# Patient Record
Sex: Female | Born: 1935
Health system: Southern US, Community
[De-identification: ages and names within clinical notes are randomized; demographics above are authoritative.]

## PROBLEM LIST (undated history)

## (undated) DIAGNOSIS — Z8719 Personal history of other diseases of the digestive system: Secondary | ICD-10-CM

## (undated) DIAGNOSIS — K219 Gastro-esophageal reflux disease without esophagitis: Secondary | ICD-10-CM

## (undated) DIAGNOSIS — H269 Unspecified cataract: Secondary | ICD-10-CM

## (undated) DIAGNOSIS — N189 Chronic kidney disease, unspecified: Secondary | ICD-10-CM

## (undated) DIAGNOSIS — M1990 Unspecified osteoarthritis, unspecified site: Secondary | ICD-10-CM

## (undated) DIAGNOSIS — M4800 Spinal stenosis, site unspecified: Secondary | ICD-10-CM

## (undated) HISTORY — DX: Unspecified osteoarthritis, unspecified site: M19.90

## (undated) HISTORY — PX: TONSILLECTOMY: SUR1361

## (undated) HISTORY — PX: OTHER SURGICAL HISTORY: SHX169

## (undated) HISTORY — PX: UPPER GI ENDOSCOPY: SHX6162

## (undated) HISTORY — PX: MULTIPLE TOOTH EXTRACTIONS: SHX2053

## (undated) HISTORY — PX: COLONOSCOPY: SHX174

---

## 1999-05-10 ENCOUNTER — Other Ambulatory Visit: Admission: RE | Admit: 1999-05-10 | Discharge: 1999-05-10 | Payer: Self-pay | Admitting: Obstetrics and Gynecology

## 2000-04-22 ENCOUNTER — Other Ambulatory Visit: Admission: RE | Admit: 2000-04-22 | Discharge: 2000-04-22 | Payer: Self-pay | Admitting: Obstetrics and Gynecology

## 2000-08-02 ENCOUNTER — Encounter: Payer: Self-pay | Admitting: Internal Medicine

## 2000-08-02 ENCOUNTER — Encounter: Admission: RE | Admit: 2000-08-02 | Discharge: 2000-08-02 | Payer: Self-pay | Admitting: Internal Medicine

## 2000-10-10 ENCOUNTER — Encounter: Admission: RE | Admit: 2000-10-10 | Discharge: 2000-10-10 | Payer: Self-pay | Admitting: Obstetrics and Gynecology

## 2000-10-10 ENCOUNTER — Encounter: Payer: Self-pay | Admitting: Obstetrics and Gynecology

## 2001-01-14 ENCOUNTER — Encounter: Payer: Self-pay | Admitting: Internal Medicine

## 2001-01-14 ENCOUNTER — Encounter: Admission: RE | Admit: 2001-01-14 | Discharge: 2001-01-14 | Payer: Self-pay | Admitting: Internal Medicine

## 2001-03-06 ENCOUNTER — Encounter: Payer: Self-pay | Admitting: Orthopedic Surgery

## 2001-03-06 ENCOUNTER — Ambulatory Visit (HOSPITAL_COMMUNITY): Admission: RE | Admit: 2001-03-06 | Discharge: 2001-03-06 | Payer: Self-pay | Admitting: Orthopedic Surgery

## 2001-03-21 ENCOUNTER — Encounter: Payer: Self-pay | Admitting: Orthopedic Surgery

## 2001-03-21 ENCOUNTER — Encounter: Admission: RE | Admit: 2001-03-21 | Discharge: 2001-03-21 | Payer: Self-pay | Admitting: Orthopedic Surgery

## 2001-04-02 ENCOUNTER — Encounter: Admission: RE | Admit: 2001-04-02 | Discharge: 2001-04-02 | Payer: Self-pay | Admitting: Orthopedic Surgery

## 2001-04-02 ENCOUNTER — Encounter: Payer: Self-pay | Admitting: Orthopedic Surgery

## 2001-04-28 ENCOUNTER — Other Ambulatory Visit: Admission: RE | Admit: 2001-04-28 | Discharge: 2001-04-28 | Payer: Self-pay | Admitting: Obstetrics and Gynecology

## 2001-10-14 ENCOUNTER — Encounter: Admission: RE | Admit: 2001-10-14 | Discharge: 2001-10-14 | Payer: Self-pay | Admitting: Obstetrics and Gynecology

## 2001-10-14 ENCOUNTER — Encounter: Payer: Self-pay | Admitting: Obstetrics and Gynecology

## 2001-10-16 ENCOUNTER — Encounter: Admission: RE | Admit: 2001-10-16 | Discharge: 2001-10-16 | Payer: Self-pay | Admitting: Obstetrics and Gynecology

## 2001-10-16 ENCOUNTER — Encounter: Payer: Self-pay | Admitting: Obstetrics and Gynecology

## 2002-10-20 ENCOUNTER — Encounter: Admission: RE | Admit: 2002-10-20 | Discharge: 2002-10-20 | Payer: Self-pay | Admitting: Internal Medicine

## 2002-10-20 ENCOUNTER — Encounter: Payer: Self-pay | Admitting: Internal Medicine

## 2003-01-14 ENCOUNTER — Other Ambulatory Visit: Admission: RE | Admit: 2003-01-14 | Discharge: 2003-01-14 | Payer: Self-pay | Admitting: Obstetrics and Gynecology

## 2003-11-22 ENCOUNTER — Encounter: Admission: RE | Admit: 2003-11-22 | Discharge: 2003-11-22 | Payer: Self-pay | Admitting: Obstetrics and Gynecology

## 2004-01-18 ENCOUNTER — Other Ambulatory Visit: Admission: RE | Admit: 2004-01-18 | Discharge: 2004-01-18 | Payer: Self-pay | Admitting: Obstetrics and Gynecology

## 2004-11-01 ENCOUNTER — Encounter: Admission: RE | Admit: 2004-11-01 | Discharge: 2004-11-01 | Payer: Self-pay | Admitting: Internal Medicine

## 2004-12-14 ENCOUNTER — Encounter: Admission: RE | Admit: 2004-12-14 | Discharge: 2004-12-14 | Payer: Self-pay | Admitting: Obstetrics and Gynecology

## 2006-01-24 ENCOUNTER — Other Ambulatory Visit: Admission: RE | Admit: 2006-01-24 | Discharge: 2006-01-24 | Payer: Self-pay | Admitting: Obstetrics and Gynecology

## 2006-03-07 ENCOUNTER — Encounter: Admission: RE | Admit: 2006-03-07 | Discharge: 2006-03-07 | Payer: Self-pay | Admitting: Obstetrics and Gynecology

## 2006-06-07 ENCOUNTER — Encounter: Admission: RE | Admit: 2006-06-07 | Discharge: 2006-06-07 | Payer: Self-pay | Admitting: Internal Medicine

## 2007-07-01 ENCOUNTER — Encounter: Admission: RE | Admit: 2007-07-01 | Discharge: 2007-07-01 | Payer: Self-pay | Admitting: Internal Medicine

## 2007-09-29 ENCOUNTER — Encounter: Admission: RE | Admit: 2007-09-29 | Discharge: 2007-09-29 | Payer: Self-pay | Admitting: Obstetrics and Gynecology

## 2008-07-29 ENCOUNTER — Other Ambulatory Visit: Admission: RE | Admit: 2008-07-29 | Discharge: 2008-07-29 | Payer: Self-pay | Admitting: Obstetrics and Gynecology

## 2008-10-01 ENCOUNTER — Encounter: Admission: RE | Admit: 2008-10-01 | Discharge: 2008-10-01 | Payer: Self-pay | Admitting: Obstetrics and Gynecology

## 2009-10-04 ENCOUNTER — Encounter: Admission: RE | Admit: 2009-10-04 | Discharge: 2009-10-04 | Payer: Self-pay | Admitting: Obstetrics and Gynecology

## 2009-10-17 ENCOUNTER — Encounter: Admission: RE | Admit: 2009-10-17 | Discharge: 2009-10-17 | Payer: Self-pay | Admitting: Internal Medicine

## 2010-12-31 ENCOUNTER — Encounter: Payer: Self-pay | Admitting: Internal Medicine

## 2012-03-26 DIAGNOSIS — M899 Disorder of bone, unspecified: Secondary | ICD-10-CM | POA: Diagnosis not present

## 2012-05-14 DIAGNOSIS — M899 Disorder of bone, unspecified: Secondary | ICD-10-CM | POA: Diagnosis not present

## 2012-05-14 DIAGNOSIS — R030 Elevated blood-pressure reading, without diagnosis of hypertension: Secondary | ICD-10-CM | POA: Diagnosis not present

## 2012-05-14 DIAGNOSIS — E782 Mixed hyperlipidemia: Secondary | ICD-10-CM | POA: Diagnosis not present

## 2012-05-14 DIAGNOSIS — M949 Disorder of cartilage, unspecified: Secondary | ICD-10-CM | POA: Diagnosis not present

## 2012-09-08 DIAGNOSIS — Z23 Encounter for immunization: Secondary | ICD-10-CM | POA: Diagnosis not present

## 2012-11-12 DIAGNOSIS — Z124 Encounter for screening for malignant neoplasm of cervix: Secondary | ICD-10-CM | POA: Diagnosis not present

## 2012-11-12 DIAGNOSIS — Z1231 Encounter for screening mammogram for malignant neoplasm of breast: Secondary | ICD-10-CM | POA: Diagnosis not present

## 2012-11-12 DIAGNOSIS — J329 Chronic sinusitis, unspecified: Secondary | ICD-10-CM | POA: Diagnosis not present

## 2012-11-14 DIAGNOSIS — M949 Disorder of cartilage, unspecified: Secondary | ICD-10-CM | POA: Diagnosis not present

## 2012-11-14 DIAGNOSIS — E782 Mixed hyperlipidemia: Secondary | ICD-10-CM | POA: Diagnosis not present

## 2012-11-14 DIAGNOSIS — R030 Elevated blood-pressure reading, without diagnosis of hypertension: Secondary | ICD-10-CM | POA: Diagnosis not present

## 2012-11-14 DIAGNOSIS — Z0000 Encounter for general adult medical examination without abnormal findings: Secondary | ICD-10-CM | POA: Diagnosis not present

## 2013-01-28 DIAGNOSIS — H2510 Age-related nuclear cataract, unspecified eye: Secondary | ICD-10-CM | POA: Diagnosis not present

## 2013-01-28 DIAGNOSIS — H11829 Conjunctivochalasis, unspecified eye: Secondary | ICD-10-CM | POA: Diagnosis not present

## 2013-01-31 ENCOUNTER — Ambulatory Visit (INDEPENDENT_AMBULATORY_CARE_PROVIDER_SITE_OTHER): Payer: Medicare Other | Admitting: Family Medicine

## 2013-01-31 ENCOUNTER — Ambulatory Visit: Payer: PRIVATE HEALTH INSURANCE

## 2013-01-31 VITALS — BP 181/76 | HR 72 | Temp 97.9°F | Resp 16 | Ht 61.5 in | Wt 135.0 lb

## 2013-01-31 DIAGNOSIS — M84376A Stress fracture, unspecified foot, initial encounter for fracture: Secondary | ICD-10-CM | POA: Diagnosis not present

## 2013-01-31 DIAGNOSIS — M25579 Pain in unspecified ankle and joints of unspecified foot: Secondary | ICD-10-CM

## 2013-01-31 NOTE — Progress Notes (Signed)
Subjective: Patient has been hurting over the past week in her right foot, but hurt worse last night in the night and then again today. She had a little erythema and ecchymosis develop overlying the distal third and fourth metatarsals. Knows of no injury. She is an active individual. Does go to the gym. She thinks she may have had osteopenia on her bone scan, not osteoporosis. She has seen Dr. Luiz Blare in the past as her orthopedist.  Objective: No acute distress. Mild ecchymosis over the distal third fourth metatarsals at the M TP joint area. She is quite tender over the distal third metatarsal, some over the fourth.  [facility] reading (PRIMARY) by  Dr. Alwyn Ren Possible subtle angulation of distal third metatarsal  Assessment: Probable stress fracture Foot pain  Plan: Refer her back to her orthopedist Postop shoe Continue using cane.

## 2013-01-31 NOTE — Patient Instructions (Signed)
Minimize weight-bearing Wear postop shoe Followup with Dr. Luiz Blare Ibuprofen for pain

## 2013-02-03 DIAGNOSIS — S92309A Fracture of unspecified metatarsal bone(s), unspecified foot, initial encounter for closed fracture: Secondary | ICD-10-CM | POA: Diagnosis not present

## 2013-02-19 DIAGNOSIS — S92309A Fracture of unspecified metatarsal bone(s), unspecified foot, initial encounter for closed fracture: Secondary | ICD-10-CM | POA: Diagnosis not present

## 2013-03-12 DIAGNOSIS — M25579 Pain in unspecified ankle and joints of unspecified foot: Secondary | ICD-10-CM | POA: Diagnosis not present

## 2013-03-12 DIAGNOSIS — Q762 Congenital spondylolisthesis: Secondary | ICD-10-CM | POA: Diagnosis not present

## 2013-03-21 DIAGNOSIS — M47817 Spondylosis without myelopathy or radiculopathy, lumbosacral region: Secondary | ICD-10-CM | POA: Diagnosis not present

## 2013-03-24 DIAGNOSIS — M48061 Spinal stenosis, lumbar region without neurogenic claudication: Secondary | ICD-10-CM | POA: Diagnosis not present

## 2013-03-24 DIAGNOSIS — M545 Low back pain: Secondary | ICD-10-CM | POA: Diagnosis not present

## 2013-04-01 DIAGNOSIS — M48061 Spinal stenosis, lumbar region without neurogenic claudication: Secondary | ICD-10-CM | POA: Diagnosis not present

## 2013-04-09 DIAGNOSIS — M48061 Spinal stenosis, lumbar region without neurogenic claudication: Secondary | ICD-10-CM | POA: Diagnosis not present

## 2013-05-06 DIAGNOSIS — M545 Low back pain: Secondary | ICD-10-CM | POA: Diagnosis not present

## 2013-05-21 DIAGNOSIS — M545 Low back pain: Secondary | ICD-10-CM | POA: Diagnosis not present

## 2013-05-21 DIAGNOSIS — E782 Mixed hyperlipidemia: Secondary | ICD-10-CM | POA: Diagnosis not present

## 2013-05-21 DIAGNOSIS — M949 Disorder of cartilage, unspecified: Secondary | ICD-10-CM | POA: Diagnosis not present

## 2013-05-21 DIAGNOSIS — K649 Unspecified hemorrhoids: Secondary | ICD-10-CM | POA: Diagnosis not present

## 2013-06-09 IMAGING — CR DG FOOT COMPLETE 3+V*R*
3 series · 3 of 3 positions shown · non-contrast
Comparison: None.

CLINICAL DATA: Foot pain

RIGHT FOOT COMPLETE - 3+ VIEW

[AP]
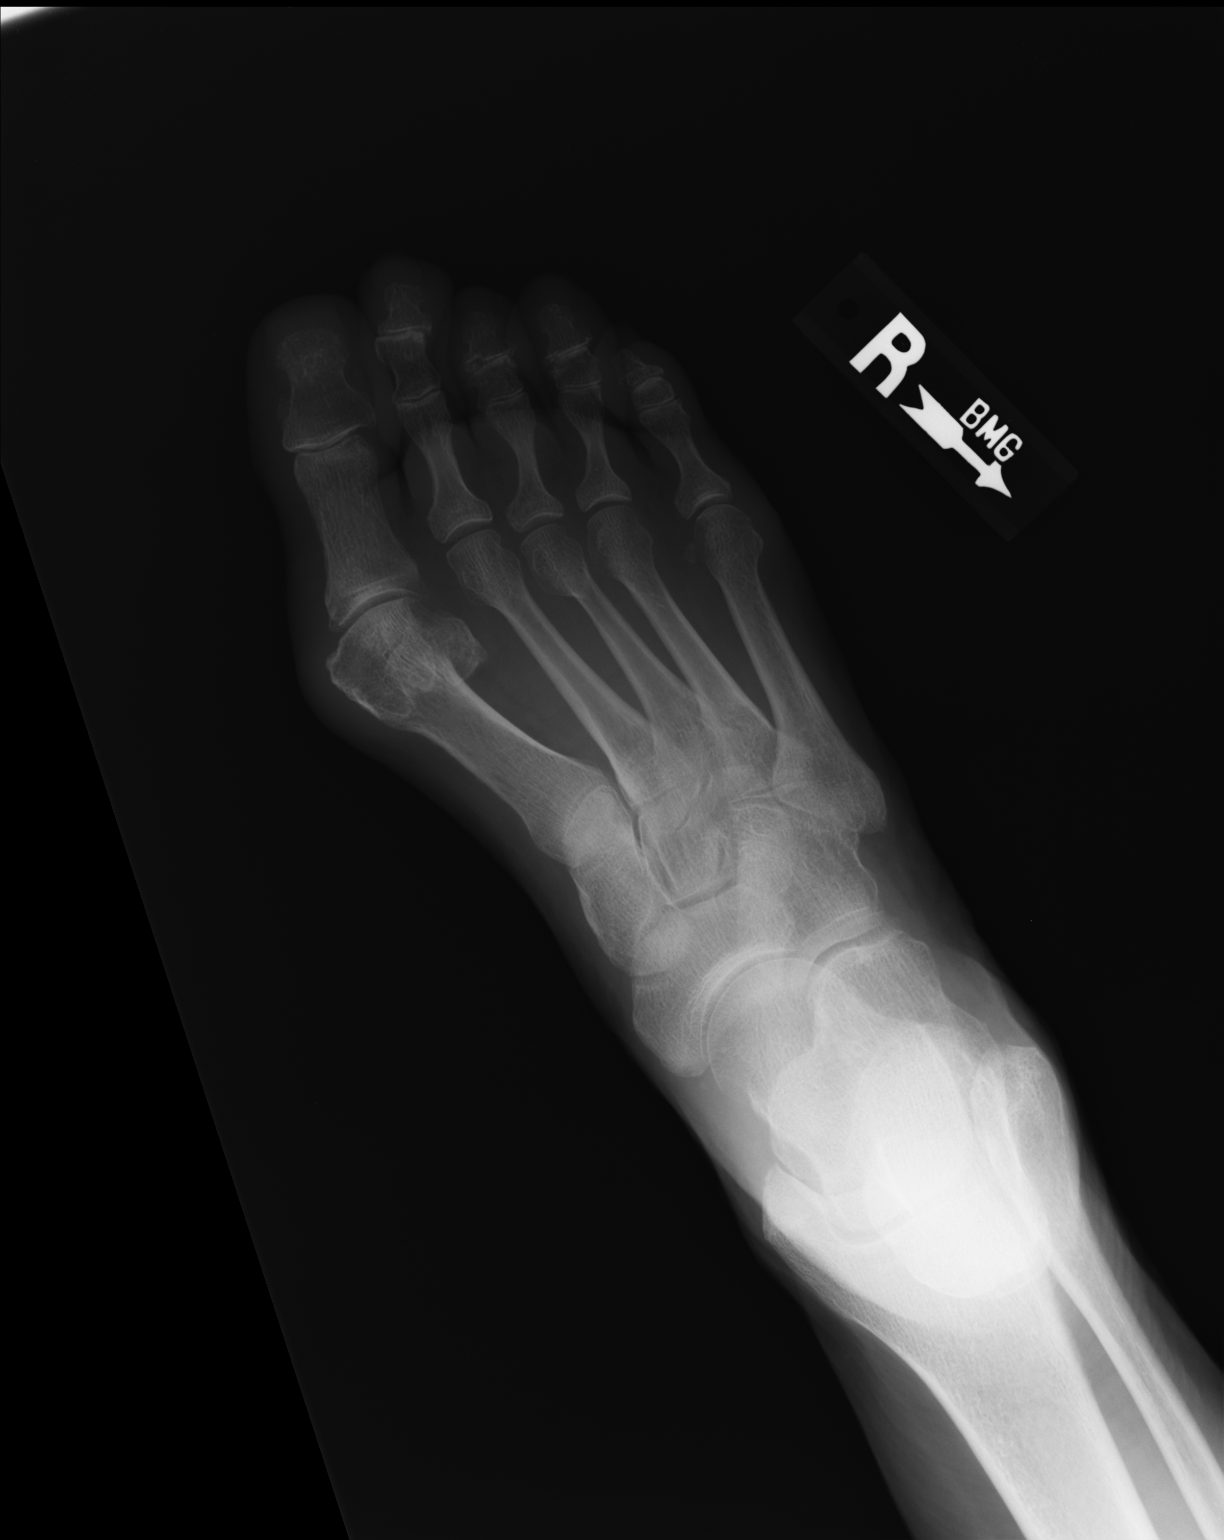

[ap obl int rot]
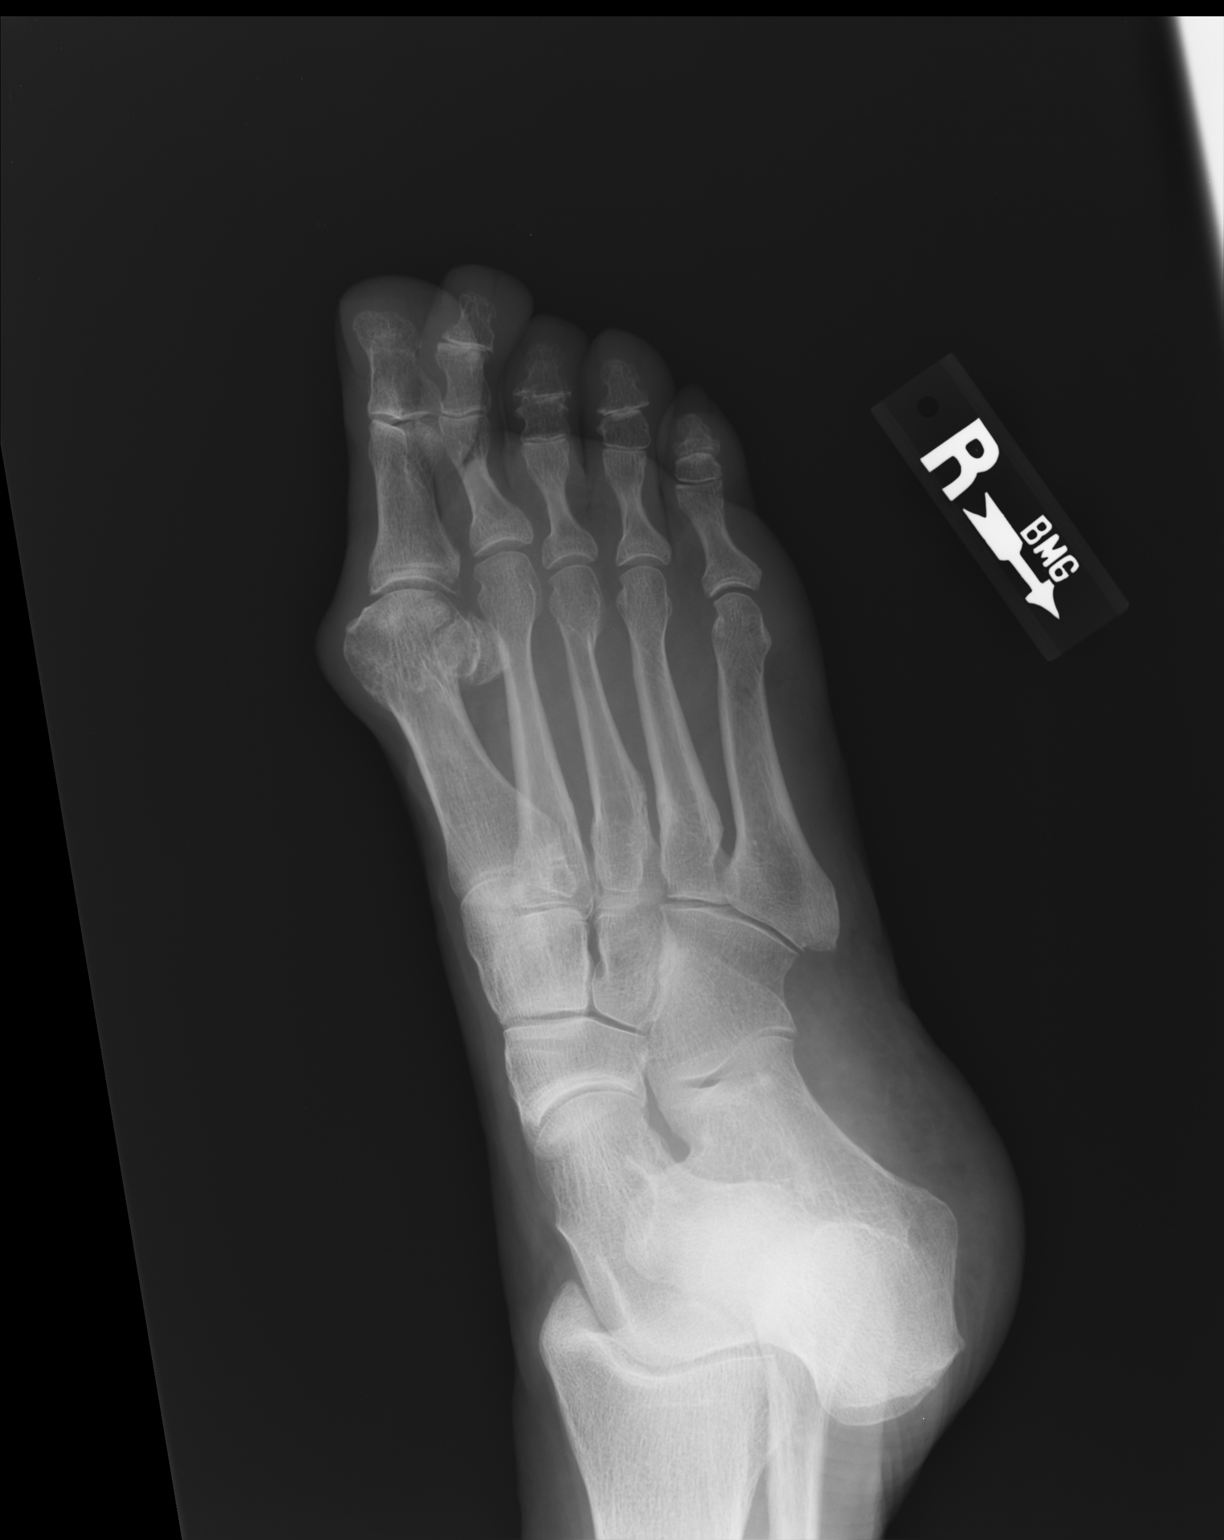

[lateral]
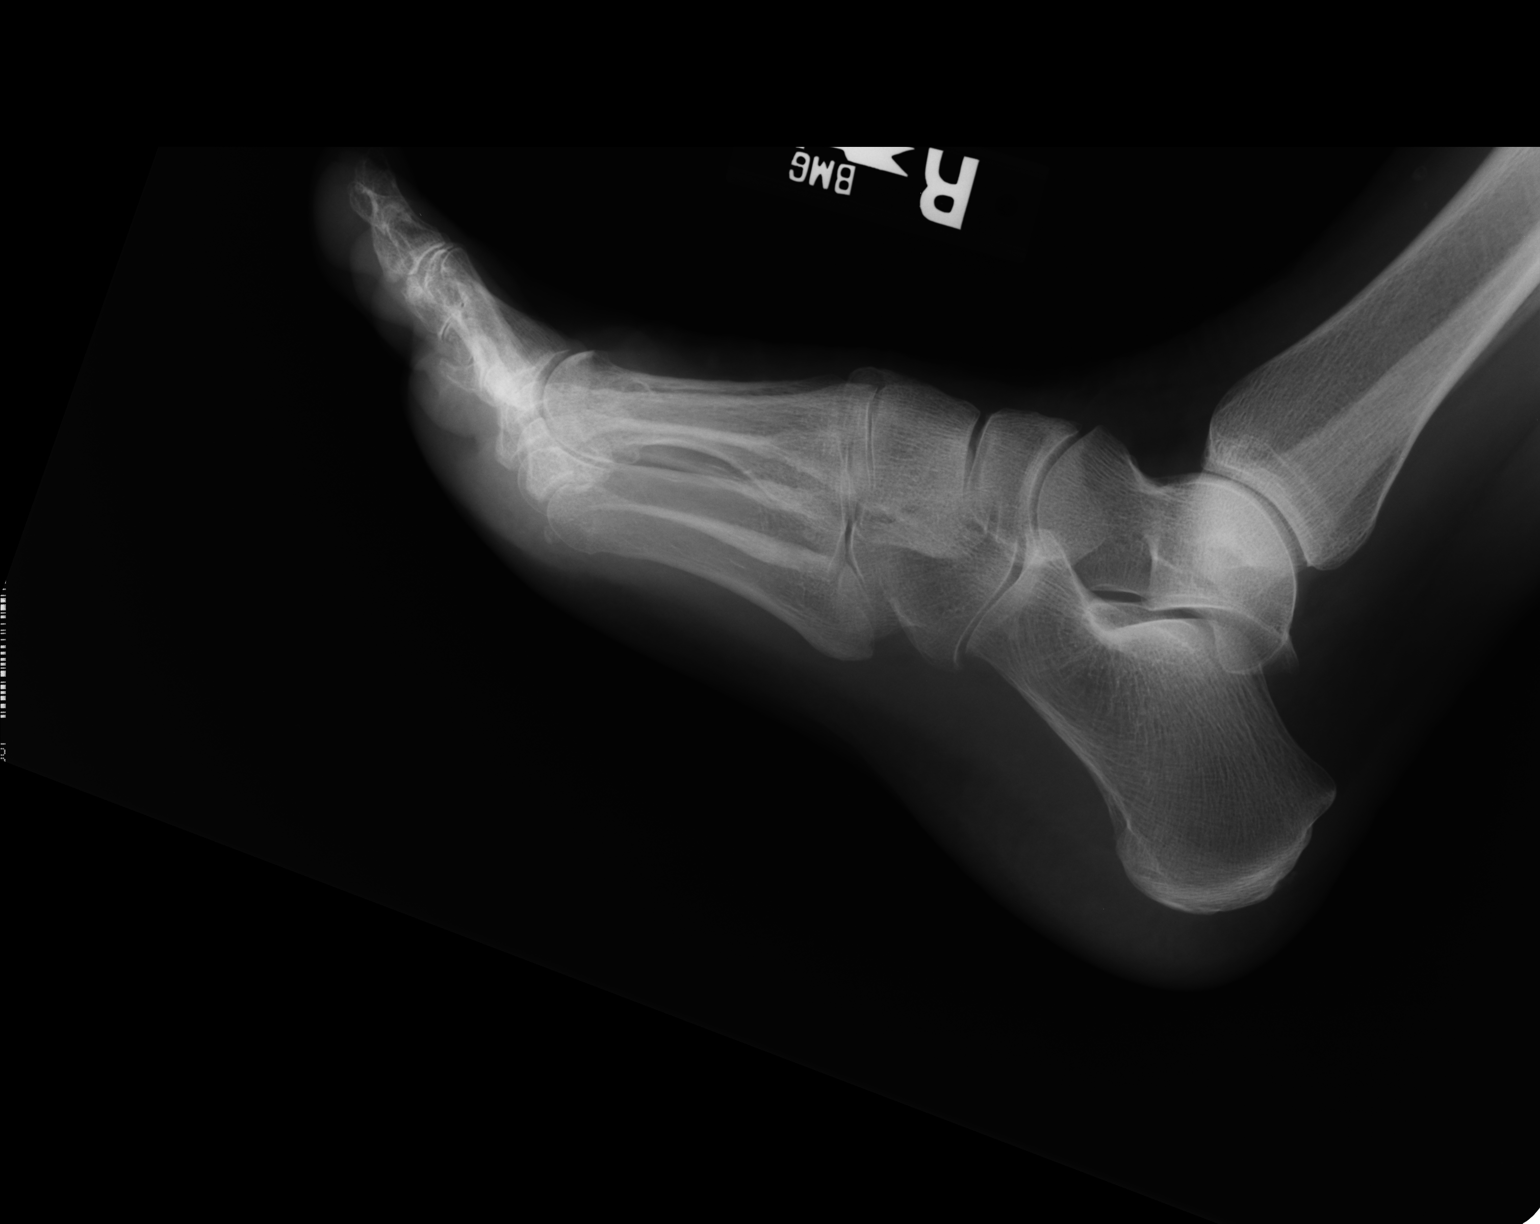

[3 of 3 positions shown; findings below may reference images not displayed]

FINDINGS: Three views of the right foot submitted.  Mild
degenerative changes first tarsometatarsal joint.  There is a
subtle lucent line distal aspect third metatarsal suspicious for
nondisplaced fracture.  Clinical correlation is necessary.
IMPRESSION: Mild degenerative changes first tarsometatarsal joint.  There is a
subtle lucent line distal aspect third metatarsal suspicious for
nondisplaced fracture.  Clinical correlation is necessary.

## 2013-09-18 DIAGNOSIS — Z23 Encounter for immunization: Secondary | ICD-10-CM | POA: Diagnosis not present

## 2013-11-18 DIAGNOSIS — E782 Mixed hyperlipidemia: Secondary | ICD-10-CM | POA: Diagnosis not present

## 2013-11-18 DIAGNOSIS — Z0000 Encounter for general adult medical examination without abnormal findings: Secondary | ICD-10-CM | POA: Diagnosis not present

## 2013-11-18 DIAGNOSIS — M545 Low back pain: Secondary | ICD-10-CM | POA: Diagnosis not present

## 2013-11-18 DIAGNOSIS — K219 Gastro-esophageal reflux disease without esophagitis: Secondary | ICD-10-CM | POA: Diagnosis not present

## 2013-11-18 DIAGNOSIS — M899 Disorder of bone, unspecified: Secondary | ICD-10-CM | POA: Diagnosis not present

## 2013-11-30 DIAGNOSIS — Z1231 Encounter for screening mammogram for malignant neoplasm of breast: Secondary | ICD-10-CM | POA: Diagnosis not present

## 2013-11-30 DIAGNOSIS — L940 Localized scleroderma [morphea]: Secondary | ICD-10-CM | POA: Diagnosis not present

## 2014-01-25 DIAGNOSIS — H5200 Hypermetropia, unspecified eye: Secondary | ICD-10-CM | POA: Diagnosis not present

## 2014-01-25 DIAGNOSIS — H2589 Other age-related cataract: Secondary | ICD-10-CM | POA: Diagnosis not present

## 2014-01-25 DIAGNOSIS — H04129 Dry eye syndrome of unspecified lacrimal gland: Secondary | ICD-10-CM | POA: Diagnosis not present

## 2014-05-18 DIAGNOSIS — R141 Gas pain: Secondary | ICD-10-CM | POA: Diagnosis not present

## 2014-05-18 DIAGNOSIS — K219 Gastro-esophageal reflux disease without esophagitis: Secondary | ICD-10-CM | POA: Diagnosis not present

## 2014-05-18 DIAGNOSIS — R300 Dysuria: Secondary | ICD-10-CM | POA: Diagnosis not present

## 2014-05-18 DIAGNOSIS — R110 Nausea: Secondary | ICD-10-CM | POA: Diagnosis not present

## 2014-05-18 DIAGNOSIS — N183 Chronic kidney disease, stage 3 unspecified: Secondary | ICD-10-CM | POA: Diagnosis not present

## 2014-05-18 DIAGNOSIS — R143 Flatulence: Secondary | ICD-10-CM | POA: Diagnosis not present

## 2014-05-18 DIAGNOSIS — R42 Dizziness and giddiness: Secondary | ICD-10-CM | POA: Diagnosis not present

## 2014-05-18 DIAGNOSIS — E782 Mixed hyperlipidemia: Secondary | ICD-10-CM | POA: Diagnosis not present

## 2014-05-18 DIAGNOSIS — M899 Disorder of bone, unspecified: Secondary | ICD-10-CM | POA: Diagnosis not present

## 2014-05-18 DIAGNOSIS — R252 Cramp and spasm: Secondary | ICD-10-CM | POA: Diagnosis not present

## 2014-07-27 DIAGNOSIS — M949 Disorder of cartilage, unspecified: Secondary | ICD-10-CM | POA: Diagnosis not present

## 2014-07-27 DIAGNOSIS — M899 Disorder of bone, unspecified: Secondary | ICD-10-CM | POA: Diagnosis not present

## 2014-08-31 DIAGNOSIS — Z23 Encounter for immunization: Secondary | ICD-10-CM | POA: Diagnosis not present

## 2014-10-20 DIAGNOSIS — M19041 Primary osteoarthritis, right hand: Secondary | ICD-10-CM | POA: Diagnosis not present

## 2014-12-07 DIAGNOSIS — R309 Painful micturition, unspecified: Secondary | ICD-10-CM | POA: Diagnosis not present

## 2014-12-07 DIAGNOSIS — N390 Urinary tract infection, site not specified: Secondary | ICD-10-CM | POA: Diagnosis not present

## 2014-12-07 DIAGNOSIS — Z1231 Encounter for screening mammogram for malignant neoplasm of breast: Secondary | ICD-10-CM | POA: Diagnosis not present

## 2014-12-07 DIAGNOSIS — Z124 Encounter for screening for malignant neoplasm of cervix: Secondary | ICD-10-CM | POA: Diagnosis not present

## 2014-12-30 DIAGNOSIS — N762 Acute vulvitis: Secondary | ICD-10-CM | POA: Diagnosis not present

## 2014-12-30 DIAGNOSIS — N9089 Other specified noninflammatory disorders of vulva and perineum: Secondary | ICD-10-CM | POA: Diagnosis not present

## 2015-01-07 DIAGNOSIS — R7989 Other specified abnormal findings of blood chemistry: Secondary | ICD-10-CM | POA: Diagnosis not present

## 2015-01-07 DIAGNOSIS — K219 Gastro-esophageal reflux disease without esophagitis: Secondary | ICD-10-CM | POA: Diagnosis not present

## 2015-01-07 DIAGNOSIS — N183 Chronic kidney disease, stage 3 (moderate): Secondary | ICD-10-CM | POA: Diagnosis not present

## 2015-01-07 DIAGNOSIS — E782 Mixed hyperlipidemia: Secondary | ICD-10-CM | POA: Diagnosis not present

## 2015-01-07 DIAGNOSIS — M8580 Other specified disorders of bone density and structure, unspecified site: Secondary | ICD-10-CM | POA: Diagnosis not present

## 2015-01-07 DIAGNOSIS — M545 Low back pain: Secondary | ICD-10-CM | POA: Diagnosis not present

## 2015-01-07 DIAGNOSIS — Z0000 Encounter for general adult medical examination without abnormal findings: Secondary | ICD-10-CM | POA: Diagnosis not present

## 2015-01-07 DIAGNOSIS — Z1389 Encounter for screening for other disorder: Secondary | ICD-10-CM | POA: Diagnosis not present

## 2015-01-21 DIAGNOSIS — E782 Mixed hyperlipidemia: Secondary | ICD-10-CM | POA: Diagnosis not present

## 2015-01-21 DIAGNOSIS — R7989 Other specified abnormal findings of blood chemistry: Secondary | ICD-10-CM | POA: Diagnosis not present

## 2015-03-24 DIAGNOSIS — L904 Acrodermatitis chronica atrophicans: Secondary | ICD-10-CM | POA: Diagnosis not present

## 2015-04-13 DIAGNOSIS — H5203 Hypermetropia, bilateral: Secondary | ICD-10-CM | POA: Diagnosis not present

## 2015-04-13 DIAGNOSIS — H25813 Combined forms of age-related cataract, bilateral: Secondary | ICD-10-CM | POA: Diagnosis not present

## 2015-05-24 DIAGNOSIS — K4091 Unilateral inguinal hernia, without obstruction or gangrene, recurrent: Secondary | ICD-10-CM | POA: Diagnosis not present

## 2015-06-09 DIAGNOSIS — K4090 Unilateral inguinal hernia, without obstruction or gangrene, not specified as recurrent: Secondary | ICD-10-CM | POA: Diagnosis not present

## 2015-07-08 DIAGNOSIS — F419 Anxiety disorder, unspecified: Secondary | ICD-10-CM | POA: Diagnosis not present

## 2015-07-08 DIAGNOSIS — K4090 Unilateral inguinal hernia, without obstruction or gangrene, not specified as recurrent: Secondary | ICD-10-CM | POA: Diagnosis not present

## 2015-07-08 DIAGNOSIS — M8588 Other specified disorders of bone density and structure, other site: Secondary | ICD-10-CM | POA: Diagnosis not present

## 2015-07-08 DIAGNOSIS — N183 Chronic kidney disease, stage 3 (moderate): Secondary | ICD-10-CM | POA: Diagnosis not present

## 2015-07-21 ENCOUNTER — Encounter (HOSPITAL_COMMUNITY): Payer: Self-pay

## 2015-07-21 ENCOUNTER — Other Ambulatory Visit (HOSPITAL_COMMUNITY): Payer: Medicare Other

## 2015-07-21 ENCOUNTER — Other Ambulatory Visit: Payer: Self-pay

## 2015-07-21 ENCOUNTER — Encounter (HOSPITAL_COMMUNITY)
Admission: RE | Admit: 2015-07-21 | Discharge: 2015-07-21 | Disposition: A | Discharge status: Home / Self Care | Payer: Medicare Other | Source: Ambulatory Visit | Attending: General Surgery | Admitting: General Surgery

## 2015-07-21 DIAGNOSIS — K4090 Unilateral inguinal hernia, without obstruction or gangrene, not specified as recurrent: Secondary | ICD-10-CM | POA: Insufficient documentation

## 2015-07-21 DIAGNOSIS — Z01812 Encounter for preprocedural laboratory examination: Secondary | ICD-10-CM | POA: Diagnosis not present

## 2015-07-21 DIAGNOSIS — Z01810 Encounter for preprocedural cardiovascular examination: Secondary | ICD-10-CM | POA: Diagnosis not present

## 2015-07-21 HISTORY — DX: Gastro-esophageal reflux disease without esophagitis: K21.9

## 2015-07-21 HISTORY — DX: Personal history of other diseases of the digestive system: Z87.19

## 2015-07-21 HISTORY — DX: Spinal stenosis, site unspecified: M48.00

## 2015-07-21 HISTORY — DX: Chronic kidney disease, unspecified: N18.9

## 2015-07-21 LAB — BASIC METABOLIC PANEL
Anion gap: 6 (ref 5–15)
BUN: 21 mg/dL — AB (ref 6–20)
CHLORIDE: 105 mmol/L (ref 101–111)
CO2: 29 mmol/L (ref 22–32)
Calcium: 9.6 mg/dL (ref 8.9–10.3)
Creatinine, Ser: 1.32 mg/dL — ABNORMAL HIGH (ref 0.44–1.00)
GFR calc non Af Amer: 37 mL/min — ABNORMAL LOW (ref >60)
GFR, EST AFRICAN AMERICAN: 43 mL/min — AB (ref >60)
Glucose, Bld: 107 mg/dL — ABNORMAL HIGH (ref 65–99)
POTASSIUM: 4.4 mmol/L (ref 3.5–5.1)
Sodium: 140 mmol/L (ref 135–145)

## 2015-07-21 LAB — CBC
HEMATOCRIT: 38.3 % (ref 36.0–46.0)
Hemoglobin: 13 g/dL (ref 12.0–15.0)
MCH: 32.1 pg (ref 26.0–34.0)
MCHC: 33.9 g/dL (ref 30.0–36.0)
MCV: 94.6 fL (ref 78.0–100.0)
Platelets: 200 10*3/uL (ref 150–400)
RBC: 4.05 MIL/uL (ref 3.87–5.11)
RDW: 12.4 % (ref 11.5–15.5)
WBC: 5.6 10*3/uL (ref 4.0–10.5)

## 2015-07-21 NOTE — Pre-Procedure Instructions (Signed)
    Shannon Bowman  07/21/2015      [place], [place] - [address] [place] [address] Phone: [phone] Fax: [phone]    Your procedure is scheduled on Friday August 19th 2016.  Report to [id number] Admitting at 730 A.M.  Call this number if you have problems in the days leading up to your surgery:  [phone]  Call this number if you have problems the morning of surgery:  [phone]   Remember:  Do not eat food or drink liquids after midnight Thursday August 18th.  Take these medicines the morning of surgery with A SIP OF WATER Cetirizine (Zyrtec), Systane eyedrops    STOP: ALL Vitamins, Supplements, Effient and Herbal Medications, Fish Oils, Aspirins, NSAIDs (Nonsteroidal Anti-inflammatories such as  Ibuprofen, Aleve, or Advil), and Goody's/BC Powders 7 days prior to surgery, until after surgery as directed by your physician. INCLUDES Calcium, Glucosamine-Chondroitin, Krill Oil, Meloxicam.    Do not wear jewelry, make-up or nail polish.  Do not wear lotions, powders, or perfumes.  You may wear deodorant.  Do not shave 48 hours prior to surgery.  Men may shave face and neck.  Do not bring valuables to the hospital.  [facility] is not responsible for any belongings or valuables.  Contacts, dentures or bridgework may not be worn into surgery.  Leave your suitcase in the car.  After surgery it may be brought to your room.  For patients admitted to the hospital, discharge time will be determined by your treatment team.  Patients discharged the day of surgery will not be allowed to drive home.   Special instructions: Please follow these instructions carefully:  1. Shower with CHG Soap the night before surgery and the morning of Surgery. 2. If you choose to wash your hair, wash your hair first as usual with your normal shampoo. 3. After you shampoo, rinse  your hair and body thoroughly to remove the Shampoo. 4. Use CHG as you would any other liquid soap. You can apply chg directly to the skin and wash gently with scrungie or a clean washcloth. 5. Apply the CHG Soap to your body ONLY FROM THE NECK DOWN. Do not use on open wounds or open sores. Avoid contact with your eyes, ears, mouth and genitals (private parts). Wash genitals (private parts) with your normal soap. 6. Wash thoroughly, paying special attention to the area where your surgery will be performed. 7. Thoroughly rinse your body with warm water from the neck down. 8. DO NOT shower/wash with your normal soap after using and rinsing off the CHG Soap. 9. Pat yourself dry with a clean towel.  10. Wear clean pajamas.  11. Place clean sheets on your bed the night of your first shower and do not sleep with pets.  Day of Surgery  Do not apply any lotions/deodorants the morning of surgery. Please wear clean clothes to the hospital/surgery center.    Please read over the following fact sheets that you were given. Pain Booklet, Coughing and Deep Breathing and Surgical Site Infection Prevention

## 2015-07-21 NOTE — Progress Notes (Addendum)
Spoke with Myra Gianotti, PA regarding patient's age and renal dz, will obtain baseline EKG today in PAT. Pt's PCP is Dr. Delfina Redwood.  Have requested recent office note, labs, and any EKG from his office.

## 2015-07-25 ENCOUNTER — Ambulatory Visit: Payer: Self-pay | Admitting: General Surgery

## 2015-07-28 ENCOUNTER — Ambulatory Visit: Payer: Self-pay | Admitting: General Surgery

## 2015-07-28 MED ORDER — VANCOMYCIN HCL IN DEXTROSE 1-5 GM/200ML-% IV SOLN
1000.0000 mg | INTRAVENOUS | Status: AC
  Administered 2015-07-29: 1000 mg via INTRAVENOUS
  Filled 2015-07-28 (×2): qty 200

## 2015-07-28 MED ORDER — CHLORHEXIDINE GLUCONATE 4 % EX LIQD: 1.0000 "application " | Freq: Once | CUTANEOUS | Status: DC

## 2015-07-28 MED ORDER — CHLORHEXIDINE GLUCONATE 4 % EX LIQD: 1.0000 | Freq: Once | CUTANEOUS | Status: DC

## 2015-07-28 NOTE — H&P (Signed)
Shannon Bowman 06/09/2015 1:09 PM Location: [facility] Patient #: [id number] DOB: May 31, 1932 Married / Language: English / Race: White Female  History of Present Illness [name] M. Challis Crill MD; 06/09/2015 2:12 PM) Patient words: hernia.  The patient is a 81 year old female who presents with an inguinal hernia. She is referred by Dr. Gaetano Net for evaluation of a left groin hernia. She states that she first noticed some swelling about a month and a half ago. It does not cause her any pain or discomfort. She denies any vomiting. She has daily bowel movements. She does have some intermittent chronic nausea which she attributes to her hiatal hernia and reflux disease. She denies any prior abdominal surgery. She denies any melena or hematochezia. She denies any dysuria. She does not smoke. She denies any chest pain or chest pressure or shortness of breath at rest. She is able to walk down the isles at the supermarket without getting short of breath. Climbing a flight of stairs will make her short of breath. She denies any orthopnea or paroxysmal nocturnal dyspnea, TIAs or amaurosis fugax. She lives at her house with her husband. She does not smoke.   Problem List/Past Medical [name] [name], MD; 06/09/2015 2:13 PM) LEFT INGUINAL HERNIA (550.90  K40.90)  Other Problems [name], MD; 06/09/2015 2:13 PM) Gastroesophageal Reflux Disease Hemorrhoids Inguinal Hernia  Past Surgical History ([name], LPN; 5/88/5027 7:41 PM) Tonsillectomy  Diagnostic Studies History ([name], LPN; 2/87/8676 7:20 PM) Colonoscopy >10 years ago Mammogram within last year  Allergies ([name], LPN; 9/47/0962 8:36 PM) Penicillins  Medication History ([name], LPN; 06/07/4764 4:65 PM) Meloxicam (7.5MG  Tablet, Oral) Active. Os-Cal Ultra (600MG  Tablet, Oral) Active. Glucosamine Chondroitin Complx (Oral) Active. CoQ10 (100MG  Capsule, Oral) Active. Colace  (100MG  Capsule, Oral) Active. FiberCon (625MG  Tablet, Oral) Active. Krill Oil (Oral) Active.  Social History ([name], LPN; 0/35/4656 8:12 PM) Alcohol use Moderate alcohol use. Caffeine use Coffee, Tea. No drug use Tobacco use Former smoker.  Family History [name], LPN; 7/51/7001 7:49 PM) Arthritis Brother, Daughter, Mother. Breast Cancer Family Members In General. Colon Cancer Family Members In General. Diabetes Mellitus Brother, Father. Heart Disease Family Members In General. Hypertension Brother.  Pregnancy / Birth History [name], LPN; 4/49/6759 1:63 PM) Age of menopause >14 Gravida 2 Maternal age 39-20 Para 2  Review of Systems ([name] LPN; 8/46/6599 3:57 PM) General Not Present- Appetite Loss, Chills, Fatigue, Fever, Night Sweats, Weight Gain and Weight Loss. Skin Not Present- Change in Wart/Mole, Dryness, Hives, Jaundice, New Lesions, Non-Healing Wounds, Rash and Ulcer. HEENT Present- Hearing Loss, Hoarseness, Ringing in the Ears, Seasonal Allergies and Wears glasses/contact lenses. Not Present- Earache, Nose Bleed, Oral Ulcers, Sinus Pain, Sore Throat, Visual Disturbances and Yellow Eyes. Respiratory Not Present- Bloody sputum, Chronic Cough, Difficulty Breathing, Snoring and Wheezing. Cardiovascular Present- Leg Cramps and Shortness of Breath. Not Present- Chest Pain, Difficulty Breathing Lying Down, Palpitations, Rapid Heart Rate and Swelling of Extremities. Gastrointestinal Present- Bloating, Constipation, Excessive gas, Hemorrhoids, Indigestion and Nausea. Not Present- Abdominal Pain, Bloody Stool, Change in Bowel Habits, Chronic diarrhea, Difficulty Swallowing, Gets full quickly at meals, Rectal Pain and Vomiting. Female Genitourinary Not Present- Frequency, Nocturia, Painful Urination, Pelvic Pain and Urgency. Musculoskeletal Present- Back Pain and Joint Stiffness. Not Present- Joint Pain, Muscle Pain, Muscle Weakness and  Swelling of Extremities. Neurological Not Present- Decreased Memory, Fainting, Headaches, Numbness, Seizures, Tingling, Tremor, Trouble walking and Weakness. Psychiatric Not Present- Anxiety, Bipolar, Change in Sleep Pattern, Depression, Fearful  and Frequent crying. Endocrine Not Present- Cold Intolerance, Excessive Hunger, Hair Changes, Heat Intolerance, Hot flashes and New Diabetes. Hematology Not Present- Easy Bruising, Excessive bleeding, Gland problems, HIV and Persistent Infections.   Vitals ([name] LPN; 2/63/3354 5:62 PM) 06/09/2015 1:10 PM Weight: 134.6 lb Height: 62in Body Surface Area: 1.63 m Body Mass Index: 24.62 kg/m Temp.: 97.51F(Oral)  Pulse: 88 (Regular)  BP: 170/80 (Sitting, Left Arm, Standard)    Physical Exam [name] M. Wasif Simonich MD; 06/09/2015 2:10 PM) General Mental Status-Alert. General Appearance-Consistent with stated age. Hydration-Well hydrated. Voice-Normal.  Head and Neck Head-normocephalic, atraumatic with no lesions or palpable masses. Trachea-midline. Thyroid Gland Characteristics - normal size and consistency.  Eye Eyeball - Bilateral-Extraocular movements intact. Sclera/Conjunctiva - Bilateral-No scleral icterus.  Chest and Lung Exam Chest and lung exam reveals -quiet, even and easy respiratory effort with no use of accessory muscles and on auscultation, normal breath sounds, no adventitious sounds and normal vocal resonance. Inspection Chest Wall - Normal. Back - normal.  Breast - Did not examine.  Cardiovascular Cardiovascular examination reveals -normal heart sounds, regular rate and rhythm with no murmurs and normal pedal pulses bilaterally.  Abdomen Inspection Inspection of the abdomen reveals - No Hernias. Skin - Scar - no surgical scars. Palpation/Percussion Palpation and Percussion of the abdomen reveal - Soft, Non Tender, No Rebound tenderness, No Rigidity (guarding) and No  hepatosplenomegaly. Auscultation Auscultation of the abdomen reveals - Bowel sounds normal.  Female Genitourinary Note: Patient examined supine and standing with and without Valsalva maneuvers. On standing she has an obvious left inguinal bulge. It is soft, nontender, and easily reducible. No evidence of a right inguinal hernia.   Peripheral Vascular Upper Extremity Palpation - Pulses bilaterally normal.  Neurologic Neurologic evaluation reveals -alert and oriented x 3 with no impairment of recent or remote memory. Mental Status-Normal.  Neuropsychiatric The patient's mood and affect are described as -normal. Judgment and Insight-insight is appropriate concerning matters relevant to self.  Musculoskeletal Normal Exam - Left-Upper Extremity Strength Normal and Lower Extremity Strength Normal. Normal Exam - Right-Upper Extremity Strength Normal and Lower Extremity Strength Normal.  Lymphatic Head & Neck  General Head & Neck Lymphatics: Bilateral - Description - Normal. Axillary - Did not examine. Femoral & Inguinal - Did not examine.    Assessment & Plan [name] M. Naina Sleeper MD; 06/09/2015 2:13 PM) LEFT INGUINAL HERNIA (550.90  K40.90) Impression: She has findings consistent with a left inguinal hernia. We discussed inguinal hernia and she was given educational material. We discussed the differences between an open repair versus laparoscopic repair. I believe that she will have a better repair with a laparoscopic approach. Please see discussion below regarding risk and benefits. She has elected to proceed with surgery. Our office will contact her to schedule surgery within the next week. She prefers to have surgery done at [facility] Current Plans  Schedule for Surgery Pt Education - [facility] Free Text Education/Instructions: discussed with patient and provided information. We discussed the etiology of inguinal hernias. We discussed the signs & symptoms of  incarceration & strangulation. We discussed non-operative and operative management. We also discussed open and laparoscopic approaches.  I described laparoscopic inguinal hernia repair procedure in detail. The patient was given educational material. We discussed the risks and benefits including but not limited to bleeding, infection, chronic inguinal pain, nerve entrapment, hernia recurrence, mesh complications, hematoma formation, urinary retention, injury to the testicles, numbness in the groin, blood clots, injury to the surrounding structures, and anesthesia risk.  We also discussed the typical post operative recovery course, including no heavy lifting for 4-6 weeks. I explained that the likelihood of improvement of their symptoms is good Pt Education - Pamphlet Given - Laparoscopic Hernia Repair: discussed with patient and provided information.  [name]. [name], MD, FACS General, Bariatric, & Minimally Invasive Surgery [facility], [place]

## 2015-07-29 ENCOUNTER — Ambulatory Visit (HOSPITAL_COMMUNITY): Payer: Medicare Other | Admitting: Anesthesiology

## 2015-07-29 ENCOUNTER — Ambulatory Visit (HOSPITAL_COMMUNITY): Payer: Medicare Other | Admitting: Vascular Surgery

## 2015-07-29 ENCOUNTER — Encounter (HOSPITAL_COMMUNITY): Payer: Self-pay | Admitting: General Practice

## 2015-07-29 ENCOUNTER — Ambulatory Visit (HOSPITAL_COMMUNITY)
Admission: RE | Admit: 2015-07-29 | Discharge: 2015-07-29 | Disposition: A | Discharge status: Home / Self Care | Payer: Medicare Other | Source: Ambulatory Visit | Attending: General Surgery | Admitting: General Surgery

## 2015-07-29 ENCOUNTER — Encounter (HOSPITAL_COMMUNITY)
Admission: RE | Disposition: A | Discharge status: Home / Self Care | Payer: Self-pay | Source: Ambulatory Visit | Attending: General Surgery

## 2015-07-29 DIAGNOSIS — K219 Gastro-esophageal reflux disease without esophagitis: Secondary | ICD-10-CM | POA: Insufficient documentation

## 2015-07-29 DIAGNOSIS — Z87891 Personal history of nicotine dependence: Secondary | ICD-10-CM | POA: Insufficient documentation

## 2015-07-29 DIAGNOSIS — M1990 Unspecified osteoarthritis, unspecified site: Secondary | ICD-10-CM | POA: Insufficient documentation

## 2015-07-29 DIAGNOSIS — K4090 Unilateral inguinal hernia, without obstruction or gangrene, not specified as recurrent: Secondary | ICD-10-CM | POA: Insufficient documentation

## 2015-07-29 HISTORY — PX: INGUINAL HERNIA REPAIR: SHX194

## 2015-07-29 SURGERY — REPAIR, HERNIA, INGUINAL, LAPAROSCOPIC
Anesthesia: General

## 2015-07-29 MED ORDER — ROCURONIUM BROMIDE 100 MG/10ML IV SOLN
INTRAVENOUS | Status: DC | PRN
  Administered 2015-07-29: 40 mg via INTRAVENOUS

## 2015-07-29 MED ORDER — ROCURONIUM BROMIDE 50 MG/5ML IV SOLN
INTRAVENOUS | Status: AC
  Filled 2015-07-29: qty 1

## 2015-07-29 MED ORDER — LIDOCAINE HCL (CARDIAC) 20 MG/ML IV SOLN
INTRAVENOUS | Status: DC | PRN
  Administered 2015-07-29: 80 mg via INTRAVENOUS

## 2015-07-29 MED ORDER — PROPOFOL 10 MG/ML IV BOLUS
INTRAVENOUS | Status: AC
  Filled 2015-07-29: qty 20

## 2015-07-29 MED ORDER — ONDANSETRON HCL 4 MG/2ML IJ SOLN
INTRAMUSCULAR | Status: AC
  Filled 2015-07-29: qty 2

## 2015-07-29 MED ORDER — PHENYLEPHRINE HCL 10 MG/ML IJ SOLN
INTRAMUSCULAR | Status: DC | PRN
  Administered 2015-07-29: 40 ug via INTRAVENOUS

## 2015-07-29 MED ORDER — ARTIFICIAL TEARS OP OINT
TOPICAL_OINTMENT | OPHTHALMIC | Status: AC
  Filled 2015-07-29: qty 3.5

## 2015-07-29 MED ORDER — SODIUM CHLORIDE 0.9 % IV SOLN: INTRAVENOUS | Status: DC

## 2015-07-29 MED ORDER — BUPIVACAINE LIPOSOME 1.3 % IJ SUSP
20.0000 mL | INTRAMUSCULAR | Status: DC
  Filled 2015-07-29: qty 20

## 2015-07-29 MED ORDER — SODIUM CHLORIDE 0.9 % IJ SOLN
INTRAMUSCULAR | Status: DC | PRN
  Administered 2015-07-29: 20 mL

## 2015-07-29 MED ORDER — OXYCODONE-ACETAMINOPHEN 5-325 MG PO TABS: 1.0000 | ORAL_TABLET | ORAL | Status: DC | PRN

## 2015-07-29 MED ORDER — FENTANYL CITRATE (PF) 250 MCG/5ML IJ SOLN
INTRAMUSCULAR | Status: AC
  Filled 2015-07-29: qty 5

## 2015-07-29 MED ORDER — BUPIVACAINE-EPINEPHRINE 0.25% -1:200000 IJ SOLN
INTRAMUSCULAR | Status: DC | PRN
  Administered 2015-07-29: 20 mL

## 2015-07-29 MED ORDER — ONDANSETRON HCL 4 MG/2ML IJ SOLN: 4.0000 mg | Freq: Once | INTRAMUSCULAR | Status: DC | PRN

## 2015-07-29 MED ORDER — FENTANYL CITRATE (PF) 100 MCG/2ML IJ SOLN
INTRAMUSCULAR | Status: AC
  Filled 2015-07-29: qty 2

## 2015-07-29 MED ORDER — EPHEDRINE SULFATE 50 MG/ML IJ SOLN
INTRAMUSCULAR | Status: AC
  Filled 2015-07-29: qty 1

## 2015-07-29 MED ORDER — PROPOFOL 10 MG/ML IV BOLUS
INTRAVENOUS | Status: DC | PRN
  Administered 2015-07-29: 70 mg via INTRAVENOUS

## 2015-07-29 MED ORDER — SODIUM CHLORIDE 0.9 % IJ SOLN
INTRAMUSCULAR | Status: AC
  Filled 2015-07-29: qty 10

## 2015-07-29 MED ORDER — SODIUM CHLORIDE 0.9 % IV SOLN: 250.0000 mL | INTRAVENOUS | Status: DC | PRN

## 2015-07-29 MED ORDER — LIDOCAINE HCL (CARDIAC) 20 MG/ML IV SOLN
INTRAVENOUS | Status: AC
  Filled 2015-07-29: qty 5

## 2015-07-29 MED ORDER — BUPIVACAINE-EPINEPHRINE (PF) 0.25% -1:200000 IJ SOLN
INTRAMUSCULAR | Status: AC
  Filled 2015-07-29: qty 30

## 2015-07-29 MED ORDER — OXYCODONE-ACETAMINOPHEN 5-325 MG PO TABS
ORAL_TABLET | ORAL | Status: AC
  Filled 2015-07-29: qty 1

## 2015-07-29 MED ORDER — SUGAMMADEX SODIUM 500 MG/5ML IV SOLN
INTRAVENOUS | Status: DC | PRN
  Administered 2015-07-29: 121.6 mg via INTRAVENOUS

## 2015-07-29 MED ORDER — OXYCODONE HCL 5 MG PO TABS: 5.0000 mg | ORAL_TABLET | ORAL | Status: DC | PRN

## 2015-07-29 MED ORDER — OXYCODONE-ACETAMINOPHEN 5-325 MG PO TABS
1.0000 | ORAL_TABLET | ORAL | Status: DC | PRN
  Administered 2015-07-29: 1 via ORAL

## 2015-07-29 MED ORDER — ACETAMINOPHEN 500 MG PO TABS: 1000.0000 mg | ORAL_TABLET | Freq: Four times a day (QID) | ORAL | Status: DC

## 2015-07-29 MED ORDER — ONDANSETRON HCL 4 MG/2ML IJ SOLN
INTRAMUSCULAR | Status: DC | PRN
  Administered 2015-07-29: 4 mg via INTRAVENOUS

## 2015-07-29 MED ORDER — MORPHINE SULFATE (PF) 2 MG/ML IV SOLN: 1.0000 mg | INTRAVENOUS | Status: DC | PRN

## 2015-07-29 MED ORDER — FENTANYL CITRATE (PF) 100 MCG/2ML IJ SOLN
INTRAMUSCULAR | Status: DC | PRN
  Administered 2015-07-29 (×2): 50 ug via INTRAVENOUS
  Administered 2015-07-29: 25 ug via INTRAVENOUS

## 2015-07-29 MED ORDER — EPHEDRINE SULFATE 50 MG/ML IJ SOLN
INTRAMUSCULAR | Status: DC | PRN
  Administered 2015-07-29: 2.5 mg via INTRAVENOUS
  Administered 2015-07-29 (×2): 5 mg via INTRAVENOUS

## 2015-07-29 MED ORDER — FENTANYL CITRATE (PF) 100 MCG/2ML IJ SOLN
25.0000 ug | INTRAMUSCULAR | Status: DC | PRN
  Administered 2015-07-29 (×2): 50 ug via INTRAVENOUS

## 2015-07-29 MED ORDER — LACTATED RINGERS IV SOLN
INTRAVENOUS | Status: DC
  Administered 2015-07-29: 08:00:00 via INTRAVENOUS

## 2015-07-29 MED ORDER — SUGAMMADEX SODIUM 200 MG/2ML IV SOLN
INTRAVENOUS | Status: AC
  Filled 2015-07-29: qty 2

## 2015-07-29 MED ORDER — SODIUM CHLORIDE 0.9 % IJ SOLN: 3.0000 mL | INTRAMUSCULAR | Status: DC | PRN

## 2015-07-29 MED ORDER — SUCCINYLCHOLINE CHLORIDE 20 MG/ML IJ SOLN
INTRAMUSCULAR | Status: AC
  Filled 2015-07-29: qty 1

## 2015-07-29 MED ORDER — SODIUM CHLORIDE 0.9 % IJ SOLN: 3.0000 mL | Freq: Two times a day (BID) | INTRAMUSCULAR | Status: DC

## 2015-07-29 SURGICAL SUPPLY — 53 items
ADH SKN CLS APL DERMABOND .7 (GAUZE/BANDAGES/DRESSINGS) ×1
APL SKNCLS STERI-STRIP NONHPOA (GAUZE/BANDAGES/DRESSINGS)
APPLICATOR COTTON TIP 6IN STRL (MISCELLANEOUS) ×2 IMPLANT
APPLIER CLIP 5 13 M/L LIGAMAX5 (MISCELLANEOUS)
APPLIER CLIP ROT 10 11.4 M/L (STAPLE) ×3
APR CLP MED LRG 11.4X10 (STAPLE) ×1
APR CLP MED LRG 5 ANG JAW (MISCELLANEOUS)
BENZOIN TINCTURE PRP APPL 2/3 (GAUZE/BANDAGES/DRESSINGS) IMPLANT
BLADE SURG ROTATE 9660 (MISCELLANEOUS) IMPLANT
CANISTER SUCTION 2500CC (MISCELLANEOUS) ×2 IMPLANT
CHLORAPREP W/TINT 26ML (MISCELLANEOUS) ×3 IMPLANT
CLIP APPLIE 5 13 M/L LIGAMAX5 (MISCELLANEOUS) IMPLANT
CLIP APPLIE ROT 10 11.4 M/L (STAPLE) IMPLANT
COVER SURGICAL LIGHT HANDLE (MISCELLANEOUS) ×3 IMPLANT
DECANTER SPIKE VIAL GLASS SM (MISCELLANEOUS) ×3 IMPLANT
DERMABOND ADVANCED (GAUZE/BANDAGES/DRESSINGS) ×2
DERMABOND ADVANCED .7 DNX12 (GAUZE/BANDAGES/DRESSINGS) IMPLANT
DEVICE SECURE STRAP 25 ABSORB (INSTRUMENTS) ×2 IMPLANT
DRAPE INCISE IOBAN 66X45 STRL (DRAPES) IMPLANT
DRAPE LAPAROSCOPIC ABDOMINAL (DRAPES) ×3 IMPLANT
DRSG TEGADERM 4X4.75 (GAUZE/BANDAGES/DRESSINGS) IMPLANT
ELECT REM PT RETURN 9FT ADLT (ELECTROSURGICAL) ×3
ELECTRODE REM PT RTRN 9FT ADLT (ELECTROSURGICAL) ×1 IMPLANT
GAUZE SPONGE 2X2 8PLY STRL LF (GAUZE/BANDAGES/DRESSINGS) IMPLANT
GLOVE BIOGEL M STRL SZ7.5 (GLOVE) ×3 IMPLANT
GLOVE BIOGEL PI IND STRL 8 (GLOVE) ×1 IMPLANT
GLOVE BIOGEL PI INDICATOR 8 (GLOVE) ×2
GOWN STRL REUS W/ TWL LRG LVL3 (GOWN DISPOSABLE) ×2 IMPLANT
GOWN STRL REUS W/ TWL XL LVL3 (GOWN DISPOSABLE) ×1 IMPLANT
GOWN STRL REUS W/TWL LRG LVL3 (GOWN DISPOSABLE) ×6
GOWN STRL REUS W/TWL XL LVL3 (GOWN DISPOSABLE) ×3
KIT BASIN OR (CUSTOM PROCEDURE TRAY) ×3 IMPLANT
KIT ROOM TURNOVER OR (KITS) ×3 IMPLANT
LIQUID BAND (GAUZE/BANDAGES/DRESSINGS) IMPLANT
MESH ULTRAPRO 3X6 7.6X15CM (Mesh General) ×2 IMPLANT
NS IRRIG 1000ML POUR BTL (IV SOLUTION) ×3 IMPLANT
PAD ARMBOARD 7.5X6 YLW CONV (MISCELLANEOUS) ×6 IMPLANT
RELOAD STAPLE 4.8 BLK F/HERNIA (STAPLE) ×1 IMPLANT
RELOAD STAPLE HERNIA 4.8 BLK (STAPLE) ×3 IMPLANT
SCISSORS LAP 5X35 DISP (ENDOMECHANICALS) ×2 IMPLANT
SET IRRIG TUBING LAPAROSCOPIC (IRRIGATION / IRRIGATOR) IMPLANT
SPONGE GAUZE 2X2 STER 10/PKG (GAUZE/BANDAGES/DRESSINGS)
STAPLER HERNIA 12 8.5 360D (INSTRUMENTS) ×3 IMPLANT
SUT MNCRL AB 4-0 PS2 18 (SUTURE) ×5 IMPLANT
SUT VICRYL 0 UR6 27IN ABS (SUTURE) ×2 IMPLANT
TOWEL OR 17X24 6PK STRL BLUE (TOWEL DISPOSABLE) ×3 IMPLANT
TOWEL OR 17X26 10 PK STRL BLUE (TOWEL DISPOSABLE) ×3 IMPLANT
TRAY FOLEY CATH 16FR SILVER (SET/KITS/TRAYS/PACK) IMPLANT
TRAY LAPAROSCOPIC MC (CUSTOM PROCEDURE TRAY) ×3 IMPLANT
TROCAR XCEL BLADELESS 5X75MML (TROCAR) ×6 IMPLANT
TROCAR XCEL BLUNT TIP 100MML (ENDOMECHANICALS) ×3 IMPLANT
TUBING INSUFFLATION (TUBING) ×3 IMPLANT
WATER STERILE IRR 1000ML POUR (IV SOLUTION) IMPLANT

## 2015-07-29 NOTE — Transfer of Care (Signed)
Immediate Anesthesia Transfer of Care Note  Patient: Shannon Bowman  Procedure(s) Performed: Procedure(s): LAPAROSCOPIC INGUINAL HERNIA REPAIR WITH MESH (N/A)  Patient Location: PACU  Anesthesia Type:General  Level of Consciousness: awake, alert , oriented and patient cooperative  Airway & Oxygen Therapy: Patient Spontanous Breathing and Patient connected to face mask oxygen  Post-op Assessment: Report given to RN, Post -op Vital signs reviewed and stable and Patient moving all extremities  Post vital signs: Reviewed and stable  Last Vitals:  Filed Vitals:   07/29/15 0754  BP: 162/58  Pulse: 63  Temp: 36.6 C  Resp: 20    Complications: No apparent anesthesia complications

## 2015-07-29 NOTE — H&P (View-Only) (Signed)
Shannon Bowman 06/09/2015 1:09 PM Location: [facility] Patient #: [id number] DOB: September 07, 1932 Married / Language: English / Race: White Female  History of Present Illness [name] M. Tyla Burgner MD; 06/09/2015 2:12 PM) Patient words: hernia.  The patient is a 81 year old female who presents with an inguinal hernia. She is referred by Dr. Gaetano Net for evaluation of a left groin hernia. She states that she first noticed some swelling about a month and a half ago. It does not cause her any pain or discomfort. She denies any vomiting. She has daily bowel movements. She does have some intermittent chronic nausea which she attributes to her hiatal hernia and reflux disease. She denies any prior abdominal surgery. She denies any melena or hematochezia. She denies any dysuria. She does not smoke. She denies any chest pain or chest pressure or shortness of breath at rest. She is able to walk down the isles at the supermarket without getting short of breath. Climbing a flight of stairs will make her short of breath. She denies any orthopnea or paroxysmal nocturnal dyspnea, TIAs or amaurosis fugax. She lives at her house with her husband. She does not smoke.   Problem List/Past Medical [name] [name], MD; 06/09/2015 2:13 PM) LEFT INGUINAL HERNIA (550.90  K40.90)  Other Problems [name], MD; 06/09/2015 2:13 PM) Gastroesophageal Reflux Disease Hemorrhoids Inguinal Hernia  Past Surgical History ([name], LPN; 08/23/7828 5:62 PM) Tonsillectomy  Diagnostic Studies History ([name], LPN; 01/09/8656 8:46 PM) Colonoscopy >10 years ago Mammogram within last year  Allergies ([name], LPN; 9/62/9528 4:13 PM) Penicillins  Medication History ([name], LPN; 2/44/0102 7:25 PM) Meloxicam (7.5MG  Tablet, Oral) Active. Os-Cal Ultra (600MG  Tablet, Oral) Active. Glucosamine Chondroitin Complx (Oral) Active. CoQ10 (100MG  Capsule, Oral) Active. Colace  (100MG  Capsule, Oral) Active. FiberCon (625MG  Tablet, Oral) Active. Krill Oil (Oral) Active.  Social History ([name], LPN; 3/66/4403 4:74 PM) Alcohol use Moderate alcohol use. Caffeine use Coffee, Tea. No drug use Tobacco use Former smoker.  Family History [name], LPN; 2/59/5638 7:56 PM) Arthritis Brother, Daughter, Mother. Breast Cancer Family Members In General. Colon Cancer Family Members In General. Diabetes Mellitus Brother, Father. Heart Disease Family Members In General. Hypertension Brother.  Pregnancy / Birth History [name], LPN; 4/33/2951 8:84 PM) Age of menopause >73 Gravida 2 Maternal age 42-20 Para 2  Review of Systems ([name] LPN; 1/66/0630 1:60 PM) General Not Present- Appetite Loss, Chills, Fatigue, Fever, Night Sweats, Weight Gain and Weight Loss. Skin Not Present- Change in Wart/Mole, Dryness, Hives, Jaundice, New Lesions, Non-Healing Wounds, Rash and Ulcer. HEENT Present- Hearing Loss, Hoarseness, Ringing in the Ears, Seasonal Allergies and Wears glasses/contact lenses. Not Present- Earache, Nose Bleed, Oral Ulcers, Sinus Pain, Sore Throat, Visual Disturbances and Yellow Eyes. Respiratory Not Present- Bloody sputum, Chronic Cough, Difficulty Breathing, Snoring and Wheezing. Cardiovascular Present- Leg Cramps and Shortness of Breath. Not Present- Chest Pain, Difficulty Breathing Lying Down, Palpitations, Rapid Heart Rate and Swelling of Extremities. Gastrointestinal Present- Bloating, Constipation, Excessive gas, Hemorrhoids, Indigestion and Nausea. Not Present- Abdominal Pain, Bloody Stool, Change in Bowel Habits, Chronic diarrhea, Difficulty Swallowing, Gets full quickly at meals, Rectal Pain and Vomiting. Female Genitourinary Not Present- Frequency, Nocturia, Painful Urination, Pelvic Pain and Urgency. Musculoskeletal Present- Back Pain and Joint Stiffness. Not Present- Joint Pain, Muscle Pain, Muscle Weakness and  Swelling of Extremities. Neurological Not Present- Decreased Memory, Fainting, Headaches, Numbness, Seizures, Tingling, Tremor, Trouble walking and Weakness. Psychiatric Not Present- Anxiety, Bipolar, Change in Sleep Pattern, Depression, Fearful  and Frequent crying. Endocrine Not Present- Cold Intolerance, Excessive Hunger, Hair Changes, Heat Intolerance, Hot flashes and New Diabetes. Hematology Not Present- Easy Bruising, Excessive bleeding, Gland problems, HIV and Persistent Infections.   Vitals ([name] LPN; 4/31/5400 8:67 PM) 06/09/2015 1:10 PM Weight: 134.6 lb Height: 62in Body Surface Area: 1.63 m Body Mass Index: 24.62 kg/m Temp.: 97.83F(Oral)  Pulse: 88 (Regular)  BP: 170/80 (Sitting, Left Arm, Standard)    Physical Exam [name] M. Angelo Caroll MD; 06/09/2015 2:10 PM) General Mental Status-Alert. General Appearance-Consistent with stated age. Hydration-Well hydrated. Voice-Normal.  Head and Neck Head-normocephalic, atraumatic with no lesions or palpable masses. Trachea-midline. Thyroid Gland Characteristics - normal size and consistency.  Eye Eyeball - Bilateral-Extraocular movements intact. Sclera/Conjunctiva - Bilateral-No scleral icterus.  Chest and Lung Exam Chest and lung exam reveals -quiet, even and easy respiratory effort with no use of accessory muscles and on auscultation, normal breath sounds, no adventitious sounds and normal vocal resonance. Inspection Chest Wall - Normal. Back - normal.  Breast - Did not examine.  Cardiovascular Cardiovascular examination reveals -normal heart sounds, regular rate and rhythm with no murmurs and normal pedal pulses bilaterally.  Abdomen Inspection Inspection of the abdomen reveals - No Hernias. Skin - Scar - no surgical scars. Palpation/Percussion Palpation and Percussion of the abdomen reveal - Soft, Non Tender, No Rebound tenderness, No Rigidity (guarding) and No  hepatosplenomegaly. Auscultation Auscultation of the abdomen reveals - Bowel sounds normal.  Female Genitourinary Note: Patient examined supine and standing with and without Valsalva maneuvers. On standing she has an obvious left inguinal bulge. It is soft, nontender, and easily reducible. No evidence of a right inguinal hernia.   Peripheral Vascular Upper Extremity Palpation - Pulses bilaterally normal.  Neurologic Neurologic evaluation reveals -alert and oriented x 3 with no impairment of recent or remote memory. Mental Status-Normal.  Neuropsychiatric The patient's mood and affect are described as -normal. Judgment and Insight-insight is appropriate concerning matters relevant to self.  Musculoskeletal Normal Exam - Left-Upper Extremity Strength Normal and Lower Extremity Strength Normal. Normal Exam - Right-Upper Extremity Strength Normal and Lower Extremity Strength Normal.  Lymphatic Head & Neck  General Head & Neck Lymphatics: Bilateral - Description - Normal. Axillary - Did not examine. Femoral & Inguinal - Did not examine.    Assessment & Plan [name] M. Danitza Schoenfeldt MD; 06/09/2015 2:13 PM) LEFT INGUINAL HERNIA (550.90  K40.90) Impression: She has findings consistent with a left inguinal hernia. We discussed inguinal hernia and she was given educational material. We discussed the differences between an open repair versus laparoscopic repair. I believe that she will have a better repair with a laparoscopic approach. Please see discussion below regarding risk and benefits. She has elected to proceed with surgery. Our office will contact her to schedule surgery within the next week. She prefers to have surgery done at [facility] Current Plans  Schedule for Surgery Pt Education - [facility] Free Text Education/Instructions: discussed with patient and provided information. We discussed the etiology of inguinal hernias. We discussed the signs & symptoms of  incarceration & strangulation. We discussed non-operative and operative management. We also discussed open and laparoscopic approaches.  I described laparoscopic inguinal hernia repair procedure in detail. The patient was given educational material. We discussed the risks and benefits including but not limited to bleeding, infection, chronic inguinal pain, nerve entrapment, hernia recurrence, mesh complications, hematoma formation, urinary retention, injury to the testicles, numbness in the groin, blood clots, injury to the surrounding structures, and anesthesia risk.  We also discussed the typical post operative recovery course, including no heavy lifting for 4-6 weeks. I explained that the likelihood of improvement of their symptoms is good Pt Education - Pamphlet Given - Laparoscopic Hernia Repair: discussed with patient and provided information.  [name]. [name], MD, FACS General, Bariatric, & Minimally Invasive Surgery [facility], [place]

## 2015-07-29 NOTE — Op Note (Signed)
07/29/2015  Shannon Bowman 03-14-1932   PREOPERATIVE DIAGNOSIS: left inguinal hernia.   POSTOPERATIVE DIAGNOSIS: left direct inguinal hernia.   PROCEDURE: Laparoscopic repair of left direct inguinal hernia with  mesh (TAPP).   SURGEON: [name]. [name], MD   ASSISTANT SURGEON: None.   ANESTHESIA: General plus local consisting of 0.25% Marcaine with epi. & 40cc exparel  ESTIMATED BLOOD LOSS: Minimal.   FINDINGS: The patient had a left direct inguinal hernia.  It was repaired using a 3 inch x 6  inch piece of Ethicon UltraPro mesh.   SPECIMEN: none  INDICATIONS FOR PROCEDURE: 81 yo WF with symptomatic left inguinal desiring repair.  The risks and benefits including but not limited to bleeding, infection, chronic inguinal pain, nerve entrapment, hernia recurrence, mesh complications, hematoma formation, urinary retention, injury to the testicles or the ovaries, numbness in the groin, blood clots, injury to the surrounding structures, and anesthesia risk was discussed with the patient.  DESCRIPTION OF PROCEDURE: After obtaining verbal consent and marking  the left groin in the holding area with the patient confirming the  operative site, the patient was then taken back to the operating room, placed  supine on the operating room table. General endotracheal anesthesia was  established. In & out cath performed. The patient had emptied their bladder prior to going back to  the operating room. Sequential compression devices were placed. The  abdomen and groin were prepped and draped in the usual standard surgical  fashion with ChloraPrep. The patient received  IV  antibiotics prior to the incision. A surgical time-out was performed.  Local was infiltrated at the base of the umbilicus.   Next, a 1-cm vertical infraumbilical incision was made with a #11 blade. The fascia  was grasped and lifted anteriorly. Next, the fascia was incised, and  the abdominal cavity was entered. Pursestring  suture was placed around  the fascial edges using a 0 Vicryl. A 12-mm Hasson trocar was placed.  Pneumoperitoneum was smoothly established up to a patient pressure of 15  mmHg. Laparoscope was advanced. There was no evidence of a  contralateral hernia. The patient had a defect medial to  the inferior epigastric vessel, consistent with an left direct  hernia. Two 5-mm trocars were placed, one on the right, one on the left  in the midclavicular line slightly above the level of the umbilicus all  under direct visualization. After local had been infiltrated, I then  made incision along the peritoneum on the left, starting 2 inches above  the anterior superior iliac spine and caring it medial  toward the median umbilical ligament in a lazy S configuration using  Endo Shears with electrocautery. The peritoneal flap was then gently  dissected downward from the anterior abdominal wall taking care not to  injure the inferior epigastric vessels. The pubic bone was identified.  The round ligament was identified.  Using  traction and counter traction with short graspers, I reduced the sac in  its entirety. The round ligament had been identified and preserved. The peritoneal flap was stripped from   surrounding structures. I then went about creating a large pocket by  lifting the peritoneum of the pelvic floor. I took great care not to  injure the iliac vessels. Exparel anesthetic was injected 2 finger breadths below and medial to the anterior superior iliac spine as well as along the left groin prior to placing the mesh. I then obtained a piece of Ethicon UltraPro mesh 3 inch x  6 inch,  placed it through the Hasson trocar, half of it covered medial  to the inferior epigastric vessels and half of it lateral to the  inferior epigastric vessels. The defect was well  covered with the mesh. I then secured the mesh to the abdominal wall  using an Ethicon secure strap tack. Tacks were placed through  the  Cooper's ligament, one tack on each side of the inferior epigastric  vessel and 1 tack out laterally. No tacks were placed below the  shelving edge of the inguinal ligament. Pneumoperitoneum was reduced  to 8 mmHg. I then brought the peritoneal flap back up to the abdominal  wall and tacked it to the abdominal wall using 4 tacks. There was no  defect in the peritoneum, and the mesh was well covered. I removed the  Hasson trocar and tied down the previously placed pursestring suture.  The closure was viewed laparoscopically. There was no evidence of  fascial defect. There was no air leak at the umbilicus. There was no  evidence of injury to surrounding structures. Pneumoperitoneum was  released, and the remaining trocars were removed. All skin incisions  were closed with a 4-0 Monocryl in a subcuticular fashion followed by  application of Dermabond. All needle, instrument, and sponge counts  were correct x2. There are no immediate complications. The patient  tolerated the procedure well. The patient was extubated and taken to the  recovery room in stable condition.  [name]. [name], MD, FACS General, Bariatric, & Minimally Invasive Surgery [facility], [place]

## 2015-07-29 NOTE — Anesthesia Preprocedure Evaluation (Addendum)
Anesthesia Evaluation  Patient identified by MRN, date of birth, ID band Patient awake    Reviewed: Allergy & Precautions, H&P , NPO status , Patient's Chart, lab work & pertinent test results  History of Anesthesia Complications Negative for: history of anesthetic complications  Airway Mallampati: III  TM Distance: >3 FB Neck ROM: full    Dental no notable dental hx.    Pulmonary former smoker,  breath sounds clear to auscultation  Pulmonary exam normal       Cardiovascular negative cardio ROS Normal cardiovascular examRhythm:regular Rate:Normal     Neuro/Psych Anxiety negative neurological ROS     GI/Hepatic Neg liver ROS, hiatal hernia,   Endo/Other  negative endocrine ROS  Renal/GU Renal disease (age related cr increase)     Musculoskeletal  (+) Arthritis -, Osteoarthritis,    Abdominal   Peds  Hematology negative hematology ROS (+)   Anesthesia Other Findings NPO appropriate, allergies reviewed Denies active cardiac or pulmonary symptoms, METS > 4 No recent congestive cough or symptoms of upper respiratory infection Meds - xanax for anxiety   Reproductive/Obstetrics negative OB ROS                            Anesthesia Physical Anesthesia Plan  ASA: II  Anesthesia Plan: General   Post-op Pain Management:    Induction: Intravenous  Airway Management Planned: Oral ETT  Additional Equipment:   Intra-op Plan:   Post-operative Plan: Extubation in OR  Informed Consent: I have reviewed the patients History and Physical, chart, labs and discussed the procedure including the risks, benefits and alternatives for the proposed anesthesia with the patient or authorized representative who has indicated his/her understanding and acceptance.     Plan Discussed with: Anesthesiologist, CRNA and Surgeon  Anesthesia Plan Comments:         Anesthesia Quick Evaluation

## 2015-07-29 NOTE — Anesthesia Postprocedure Evaluation (Signed)
  Anesthesia Post-op Note  Patient: Shannon Bowman  Procedure(s) Performed: Procedure(s) (LRB): LAPAROSCOPIC INGUINAL HERNIA REPAIR WITH MESH (N/A)  Patient Location: PACU  Anesthesia Type: General  Level of Consciousness: awake and alert   Airway and Oxygen Therapy: Patient Spontanous Breathing  Post-op Pain: mild  Post-op Assessment: Post-op Vital signs reviewed, Patient's Cardiovascular Status Stable, Respiratory Function Stable, Patent Airway and No signs of Nausea or vomiting  Last Vitals:  Filed Vitals:   07/29/15 1300  BP: 151/77  Pulse: 70  Temp:   Resp: 16    Post-op Vital Signs: stable   Complications: No apparent anesthesia complications

## 2015-07-29 NOTE — Interval H&P Note (Signed)
History and Physical Interval Note:  07/29/2015 10:29 AM  Shannon Bowman  has presented today for surgery, with the diagnosis of Left Inguinal Hernia  The various methods of treatment have been discussed with the patient and family. After consideration of risks, benefits and other options for treatment, the patient has consented to  Procedure(s): [place] (N/A) as a surgical intervention .  The patient's history has been reviewed, patient examined, no change in status, stable for surgery.  I have reviewed the patient's chart and labs.  Questions were answered to the patient's satisfaction.    [name]. [name], MD, [place], Bariatric, & Minimally Invasive Surgery [facility], [place]   [facility] M

## 2015-07-29 NOTE — Anesthesia Procedure Notes (Signed)
Procedure Name: Intubation Date/Time: 07/29/2015 11:03 AM Performed by: Rogers Blocker Pre-anesthesia Checklist: Patient identified, Timeout performed, Emergency Drugs available, Suction available and Patient being monitored Patient Re-evaluated:Patient Re-evaluated prior to inductionOxygen Delivery Method: Circle system utilized Preoxygenation: Pre-oxygenation with 100% oxygen Intubation Type: IV induction Ventilation: Mask ventilation without difficulty Laryngoscope Size: Mac and 4 Grade View: Grade II Tube type: Oral Tube size: 6.5 mm Number of attempts: 1 Airway Equipment and Method: Stylet Placement Confirmation: breath sounds checked- equal and bilateral,  ETT inserted through vocal cords under direct vision,  positive ETCO2 and CO2 detector Secured at: 21 cm Tube secured with: Tape Dental Injury: Teeth and Oropharynx as per pre-operative assessment

## 2015-07-29 NOTE — Discharge Instructions (Signed)
[facility]  UMBILICAL OR INGUINAL HERNIA REPAIR: POST OP INSTRUCTIONS  Always review your discharge instruction sheet given to you by the facility where your surgery was performed. IF YOU HAVE DISABILITY OR FAMILY LEAVE FORMS, YOU MUST BRING THEM TO THE OFFICE FOR PROCESSING.   DO NOT GIVE THEM TO YOUR DOCTOR.  1. A  prescription for pain medication may be given to you upon discharge.  Take your pain medication as prescribed, if needed.  If narcotic pain medicine is not needed, then you may take acetaminophen (Tylenol) or ibuprofen (Advil) as needed. 2. Take your usually prescribed medications unless otherwise directed. 3. If you need a refill on your pain medication, please contact your pharmacy.  They will contact our office to request authorization. Prescriptions will not be filled after 5 pm or on week-ends. 4. You should follow a light diet the first 24 hours after arrival home, such as soup and crackers, etc.  Be sure to include lots of fluids daily.  Resume your normal diet the day after surgery. 5. Most patients will experience some swelling and bruising around the umbilicus or in the groin and scrotum.  Ice packs and reclining will help.  Swelling and bruising can take several days to resolve.  6. It is common to experience some constipation if taking pain medication after surgery.  Increasing fluid intake and taking a stool softener (such as Colace) will usually help or prevent this problem from occurring.  A mild laxative (Milk of Magnesia or Miralax) should be taken according to package directions if there are no bowel movements after 48 hours. 7.   If your surgeon used skin glue on the incision, you may shower in 24 hours.  The glue will flake off over the next 2-3 weeks.  Any sutures or staples will be removed at the office during your follow-up visit. 8. ACTIVITIES:  You may resume regular (light) daily activities beginning the next day--such as daily self-care,  walking, climbing stairs--gradually increasing activities as tolerated.  You may have sexual intercourse when it is comfortable.  Refrain from any heavy lifting or straining until approved by your doctor. a. You may drive when you are no longer taking prescription pain medication, you can comfortably wear a seatbelt, and you can safely maneuver your car and apply brakes. b. RETURN TO WORK:  9. You should see your doctor in the office for a follow-up appointment approximately 2-3 weeks after your surgery.  Make sure that you call for this appointment within a day or two after you arrive home to insure a convenient appointment time. 10. OTHER INSTRUCTIONS: DO NOT LIFT, PUSH, OR PULL ANYTHING GREATER THAN 10 POUNDS FOR 6 WEEKS    WHEN TO CALL YOUR DOCTOR: 1. Fever over 101.0 2. Inability to urinate 3. Nausea and/or vomiting 4. Extreme swelling or bruising 5. Continued bleeding from incision. 6. Increased pain, redness, or drainage from the incision  The clinic staff is available to answer your questions during regular business hours.  Please dont hesitate to call and ask to speak to one of the nurses for clinical concerns.  If you have a medical emergency, go to the nearest emergency room or call 911.  A surgeon from [facility] is always on call at the hospital   [address] [place], [place], [place]  [address] ?  [address] [place], [place], [place]   [id number] [phone] ? [phone] ? FAX [phone] Web site: [email or web address]

## 2015-07-30 ENCOUNTER — Encounter: Payer: Medicare Other | Admitting: General Surgery

## 2015-07-30 NOTE — Progress Notes (Unsigned)
I received a phone call from her daughter, Shannon Bowman. Ms. Reid underwent laparoscopic left inguinal hernia repair with mesh by Dr. [name] yesterday at [facility]. She been having some problems with nausea and vomiting. She is able to keep down some thicker liquids yesterday. This morning should they tried scrambled eggs and some pudding and she had problems with throwing that back up. She currently is sleeping. She has been voiding. I encouraged her daughter to stop her oxycodone. I encouraged her to start some ice chips first and then try water and crackers. If she is not able to keep anything down, I told her that she should take her to the emergency department for IV antiemetics and IV fluid hydration.

## 2015-07-31 ENCOUNTER — Encounter (HOSPITAL_COMMUNITY): Payer: Self-pay | Admitting: General Surgery

## 2015-09-30 DIAGNOSIS — Z23 Encounter for immunization: Secondary | ICD-10-CM | POA: Diagnosis not present

## 2015-12-13 DIAGNOSIS — Z1231 Encounter for screening mammogram for malignant neoplasm of breast: Secondary | ICD-10-CM | POA: Diagnosis not present

## 2015-12-13 DIAGNOSIS — Z01419 Encounter for gynecological examination (general) (routine) without abnormal findings: Secondary | ICD-10-CM | POA: Diagnosis not present

## 2015-12-13 DIAGNOSIS — Z6825 Body mass index (BMI) 25.0-25.9, adult: Secondary | ICD-10-CM | POA: Diagnosis not present

## 2016-01-13 DIAGNOSIS — Z0000 Encounter for general adult medical examination without abnormal findings: Secondary | ICD-10-CM | POA: Diagnosis not present

## 2016-01-13 DIAGNOSIS — E782 Mixed hyperlipidemia: Secondary | ICD-10-CM | POA: Diagnosis not present

## 2016-01-13 DIAGNOSIS — Z1389 Encounter for screening for other disorder: Secondary | ICD-10-CM | POA: Diagnosis not present

## 2016-01-13 DIAGNOSIS — M545 Low back pain: Secondary | ICD-10-CM | POA: Diagnosis not present

## 2016-01-13 DIAGNOSIS — N183 Chronic kidney disease, stage 3 (moderate): Secondary | ICD-10-CM | POA: Diagnosis not present

## 2016-01-13 DIAGNOSIS — M8588 Other specified disorders of bone density and structure, other site: Secondary | ICD-10-CM | POA: Diagnosis not present

## 2016-01-13 DIAGNOSIS — K219 Gastro-esophageal reflux disease without esophagitis: Secondary | ICD-10-CM | POA: Diagnosis not present

## 2016-02-13 DIAGNOSIS — R946 Abnormal results of thyroid function studies: Secondary | ICD-10-CM | POA: Diagnosis not present

## 2016-03-27 DIAGNOSIS — R946 Abnormal results of thyroid function studies: Secondary | ICD-10-CM | POA: Diagnosis not present

## 2016-04-18 DIAGNOSIS — H524 Presbyopia: Secondary | ICD-10-CM | POA: Diagnosis not present

## 2016-04-18 DIAGNOSIS — H2513 Age-related nuclear cataract, bilateral: Secondary | ICD-10-CM | POA: Diagnosis not present

## 2016-05-22 DIAGNOSIS — L570 Actinic keratosis: Secondary | ICD-10-CM | POA: Diagnosis not present

## 2016-05-22 DIAGNOSIS — D225 Melanocytic nevi of trunk: Secondary | ICD-10-CM | POA: Diagnosis not present

## 2016-05-22 DIAGNOSIS — L720 Epidermal cyst: Secondary | ICD-10-CM | POA: Diagnosis not present

## 2016-05-22 DIAGNOSIS — L821 Other seborrheic keratosis: Secondary | ICD-10-CM | POA: Diagnosis not present

## 2016-05-22 DIAGNOSIS — L918 Other hypertrophic disorders of the skin: Secondary | ICD-10-CM | POA: Diagnosis not present

## 2016-05-22 DIAGNOSIS — L814 Other melanin hyperpigmentation: Secondary | ICD-10-CM | POA: Diagnosis not present

## 2016-05-22 DIAGNOSIS — L820 Inflamed seborrheic keratosis: Secondary | ICD-10-CM | POA: Diagnosis not present

## 2016-07-12 ENCOUNTER — Other Ambulatory Visit: Payer: Self-pay | Admitting: Internal Medicine

## 2016-07-12 DIAGNOSIS — N183 Chronic kidney disease, stage 3 (moderate): Secondary | ICD-10-CM | POA: Diagnosis not present

## 2016-07-12 DIAGNOSIS — M8588 Other specified disorders of bone density and structure, other site: Secondary | ICD-10-CM | POA: Diagnosis not present

## 2016-07-12 DIAGNOSIS — R1310 Dysphagia, unspecified: Secondary | ICD-10-CM

## 2016-07-12 DIAGNOSIS — R0600 Dyspnea, unspecified: Secondary | ICD-10-CM | POA: Diagnosis not present

## 2016-07-12 DIAGNOSIS — M545 Low back pain: Secondary | ICD-10-CM | POA: Diagnosis not present

## 2016-07-12 DIAGNOSIS — K219 Gastro-esophageal reflux disease without esophagitis: Secondary | ICD-10-CM | POA: Diagnosis not present

## 2016-07-12 DIAGNOSIS — R42 Dizziness and giddiness: Secondary | ICD-10-CM | POA: Diagnosis not present

## 2016-07-12 DIAGNOSIS — R946 Abnormal results of thyroid function studies: Secondary | ICD-10-CM | POA: Diagnosis not present

## 2016-07-13 ENCOUNTER — Ambulatory Visit
Admission: RE | Admit: 2016-07-13 | Discharge: 2016-07-13 | Disposition: A | Discharge status: Home / Self Care | Payer: Medicare Other | Source: Ambulatory Visit | Attending: Internal Medicine | Admitting: Internal Medicine

## 2016-07-13 DIAGNOSIS — R1310 Dysphagia, unspecified: Secondary | ICD-10-CM | POA: Diagnosis not present

## 2016-07-13 DIAGNOSIS — K224 Dyskinesia of esophagus: Secondary | ICD-10-CM | POA: Diagnosis not present

## 2016-08-07 DIAGNOSIS — M8588 Other specified disorders of bone density and structure, other site: Secondary | ICD-10-CM | POA: Diagnosis not present

## 2016-08-16 DIAGNOSIS — K219 Gastro-esophageal reflux disease without esophagitis: Secondary | ICD-10-CM | POA: Diagnosis not present

## 2016-09-14 DIAGNOSIS — Z23 Encounter for immunization: Secondary | ICD-10-CM | POA: Diagnosis not present

## 2016-11-16 DIAGNOSIS — M48061 Spinal stenosis, lumbar region without neurogenic claudication: Secondary | ICD-10-CM | POA: Diagnosis not present

## 2016-11-26 DIAGNOSIS — M545 Low back pain: Secondary | ICD-10-CM | POA: Diagnosis not present

## 2016-12-11 DIAGNOSIS — M48061 Spinal stenosis, lumbar region without neurogenic claudication: Secondary | ICD-10-CM | POA: Diagnosis not present

## 2016-12-25 DIAGNOSIS — Z124 Encounter for screening for malignant neoplasm of cervix: Secondary | ICD-10-CM | POA: Diagnosis not present

## 2016-12-25 DIAGNOSIS — Z6825 Body mass index (BMI) 25.0-25.9, adult: Secondary | ICD-10-CM | POA: Diagnosis not present

## 2016-12-25 DIAGNOSIS — Z1231 Encounter for screening mammogram for malignant neoplasm of breast: Secondary | ICD-10-CM | POA: Diagnosis not present

## 2017-01-03 DIAGNOSIS — N762 Acute vulvitis: Secondary | ICD-10-CM | POA: Diagnosis not present

## 2017-01-03 DIAGNOSIS — N904 Leukoplakia of vulva: Secondary | ICD-10-CM | POA: Diagnosis not present

## 2017-01-07 DIAGNOSIS — M48061 Spinal stenosis, lumbar region without neurogenic claudication: Secondary | ICD-10-CM | POA: Diagnosis not present

## 2017-01-25 DIAGNOSIS — N183 Chronic kidney disease, stage 3 (moderate): Secondary | ICD-10-CM | POA: Diagnosis not present

## 2017-01-25 DIAGNOSIS — Z0000 Encounter for general adult medical examination without abnormal findings: Secondary | ICD-10-CM | POA: Diagnosis not present

## 2017-01-25 DIAGNOSIS — E7800 Pure hypercholesterolemia, unspecified: Secondary | ICD-10-CM | POA: Diagnosis not present

## 2017-01-25 DIAGNOSIS — K219 Gastro-esophageal reflux disease without esophagitis: Secondary | ICD-10-CM | POA: Diagnosis not present

## 2017-01-25 DIAGNOSIS — M8588 Other specified disorders of bone density and structure, other site: Secondary | ICD-10-CM | POA: Diagnosis not present

## 2017-01-25 DIAGNOSIS — Z1389 Encounter for screening for other disorder: Secondary | ICD-10-CM | POA: Diagnosis not present

## 2017-04-05 DIAGNOSIS — H2513 Age-related nuclear cataract, bilateral: Secondary | ICD-10-CM | POA: Diagnosis not present

## 2017-04-05 DIAGNOSIS — H35361 Drusen (degenerative) of macula, right eye: Secondary | ICD-10-CM | POA: Diagnosis not present

## 2017-04-05 DIAGNOSIS — H04123 Dry eye syndrome of bilateral lacrimal glands: Secondary | ICD-10-CM | POA: Diagnosis not present

## 2017-05-21 DIAGNOSIS — C44719 Basal cell carcinoma of skin of left lower limb, including hip: Secondary | ICD-10-CM | POA: Diagnosis not present

## 2017-05-21 DIAGNOSIS — L570 Actinic keratosis: Secondary | ICD-10-CM | POA: Diagnosis not present

## 2017-05-21 DIAGNOSIS — L821 Other seborrheic keratosis: Secondary | ICD-10-CM | POA: Diagnosis not present

## 2017-06-18 DIAGNOSIS — C44719 Basal cell carcinoma of skin of left lower limb, including hip: Secondary | ICD-10-CM | POA: Diagnosis not present

## 2017-07-23 DIAGNOSIS — F419 Anxiety disorder, unspecified: Secondary | ICD-10-CM | POA: Diagnosis not present

## 2017-07-23 DIAGNOSIS — M8588 Other specified disorders of bone density and structure, other site: Secondary | ICD-10-CM | POA: Diagnosis not present

## 2017-07-23 DIAGNOSIS — K219 Gastro-esophageal reflux disease without esophagitis: Secondary | ICD-10-CM | POA: Diagnosis not present

## 2017-07-23 DIAGNOSIS — R0600 Dyspnea, unspecified: Secondary | ICD-10-CM | POA: Diagnosis not present

## 2017-07-23 DIAGNOSIS — E7800 Pure hypercholesterolemia, unspecified: Secondary | ICD-10-CM | POA: Diagnosis not present

## 2017-07-23 DIAGNOSIS — N183 Chronic kidney disease, stage 3 (moderate): Secondary | ICD-10-CM | POA: Diagnosis not present

## 2017-09-11 DIAGNOSIS — Z23 Encounter for immunization: Secondary | ICD-10-CM | POA: Diagnosis not present

## 2018-01-09 DIAGNOSIS — Z6826 Body mass index (BMI) 26.0-26.9, adult: Secondary | ICD-10-CM | POA: Diagnosis not present

## 2018-01-09 DIAGNOSIS — Z01419 Encounter for gynecological examination (general) (routine) without abnormal findings: Secondary | ICD-10-CM | POA: Diagnosis not present

## 2018-01-09 DIAGNOSIS — Z1231 Encounter for screening mammogram for malignant neoplasm of breast: Secondary | ICD-10-CM | POA: Diagnosis not present

## 2018-01-09 DIAGNOSIS — L904 Acrodermatitis chronica atrophicans: Secondary | ICD-10-CM | POA: Diagnosis not present

## 2018-02-04 DIAGNOSIS — E7800 Pure hypercholesterolemia, unspecified: Secondary | ICD-10-CM | POA: Diagnosis not present

## 2018-02-04 DIAGNOSIS — Z1389 Encounter for screening for other disorder: Secondary | ICD-10-CM | POA: Diagnosis not present

## 2018-02-04 DIAGNOSIS — Z0000 Encounter for general adult medical examination without abnormal findings: Secondary | ICD-10-CM | POA: Diagnosis not present

## 2018-02-04 DIAGNOSIS — M8588 Other specified disorders of bone density and structure, other site: Secondary | ICD-10-CM | POA: Diagnosis not present

## 2018-02-04 DIAGNOSIS — N183 Chronic kidney disease, stage 3 (moderate): Secondary | ICD-10-CM | POA: Diagnosis not present

## 2018-02-04 DIAGNOSIS — J069 Acute upper respiratory infection, unspecified: Secondary | ICD-10-CM | POA: Diagnosis not present

## 2018-04-09 DIAGNOSIS — H2513 Age-related nuclear cataract, bilateral: Secondary | ICD-10-CM | POA: Diagnosis not present

## 2018-06-18 DIAGNOSIS — Z85828 Personal history of other malignant neoplasm of skin: Secondary | ICD-10-CM | POA: Diagnosis not present

## 2018-06-18 DIAGNOSIS — L821 Other seborrheic keratosis: Secondary | ICD-10-CM | POA: Diagnosis not present

## 2018-07-25 DIAGNOSIS — N183 Chronic kidney disease, stage 3 (moderate): Secondary | ICD-10-CM | POA: Diagnosis not present

## 2018-07-25 DIAGNOSIS — M1990 Unspecified osteoarthritis, unspecified site: Secondary | ICD-10-CM | POA: Diagnosis not present

## 2018-07-25 DIAGNOSIS — R5382 Chronic fatigue, unspecified: Secondary | ICD-10-CM | POA: Diagnosis not present

## 2018-08-05 DIAGNOSIS — R0600 Dyspnea, unspecified: Secondary | ICD-10-CM | POA: Diagnosis not present

## 2018-08-05 DIAGNOSIS — E7800 Pure hypercholesterolemia, unspecified: Secondary | ICD-10-CM | POA: Diagnosis not present

## 2018-09-03 DIAGNOSIS — Z23 Encounter for immunization: Secondary | ICD-10-CM | POA: Diagnosis not present

## 2018-09-09 DIAGNOSIS — M4155 Other secondary scoliosis, thoracolumbar region: Secondary | ICD-10-CM | POA: Diagnosis not present

## 2018-09-09 DIAGNOSIS — M5136 Other intervertebral disc degeneration, lumbar region: Secondary | ICD-10-CM | POA: Diagnosis not present

## 2018-09-09 DIAGNOSIS — M5137 Other intervertebral disc degeneration, lumbosacral region: Secondary | ICD-10-CM | POA: Diagnosis not present

## 2018-09-09 DIAGNOSIS — M549 Dorsalgia, unspecified: Secondary | ICD-10-CM | POA: Diagnosis not present

## 2018-09-09 DIAGNOSIS — M546 Pain in thoracic spine: Secondary | ICD-10-CM | POA: Diagnosis not present

## 2018-09-09 DIAGNOSIS — M48062 Spinal stenosis, lumbar region with neurogenic claudication: Secondary | ICD-10-CM | POA: Diagnosis not present

## 2018-09-09 DIAGNOSIS — M4316 Spondylolisthesis, lumbar region: Secondary | ICD-10-CM | POA: Diagnosis not present

## 2018-09-09 DIAGNOSIS — M47816 Spondylosis without myelopathy or radiculopathy, lumbar region: Secondary | ICD-10-CM | POA: Diagnosis not present

## 2018-09-11 DIAGNOSIS — M5136 Other intervertebral disc degeneration, lumbar region: Secondary | ICD-10-CM | POA: Diagnosis not present

## 2018-09-11 DIAGNOSIS — M4726 Other spondylosis with radiculopathy, lumbar region: Secondary | ICD-10-CM | POA: Diagnosis not present

## 2018-09-11 DIAGNOSIS — M48061 Spinal stenosis, lumbar region without neurogenic claudication: Secondary | ICD-10-CM | POA: Diagnosis not present

## 2018-12-16 DIAGNOSIS — M4155 Other secondary scoliosis, thoracolumbar region: Secondary | ICD-10-CM | POA: Diagnosis not present

## 2018-12-16 DIAGNOSIS — M4316 Spondylolisthesis, lumbar region: Secondary | ICD-10-CM | POA: Diagnosis not present

## 2018-12-16 DIAGNOSIS — M5136 Other intervertebral disc degeneration, lumbar region: Secondary | ICD-10-CM | POA: Diagnosis not present

## 2018-12-16 DIAGNOSIS — M47816 Spondylosis without myelopathy or radiculopathy, lumbar region: Secondary | ICD-10-CM | POA: Diagnosis not present

## 2018-12-16 DIAGNOSIS — Z6826 Body mass index (BMI) 26.0-26.9, adult: Secondary | ICD-10-CM | POA: Diagnosis not present

## 2018-12-16 DIAGNOSIS — M48062 Spinal stenosis, lumbar region with neurogenic claudication: Secondary | ICD-10-CM | POA: Diagnosis not present

## 2019-01-04 DIAGNOSIS — J209 Acute bronchitis, unspecified: Secondary | ICD-10-CM | POA: Diagnosis not present

## 2019-01-13 DIAGNOSIS — N952 Postmenopausal atrophic vaginitis: Secondary | ICD-10-CM | POA: Diagnosis not present

## 2019-01-13 DIAGNOSIS — Z01419 Encounter for gynecological examination (general) (routine) without abnormal findings: Secondary | ICD-10-CM | POA: Diagnosis not present

## 2019-01-13 DIAGNOSIS — Z1231 Encounter for screening mammogram for malignant neoplasm of breast: Secondary | ICD-10-CM | POA: Diagnosis not present

## 2019-01-13 DIAGNOSIS — N904 Leukoplakia of vulva: Secondary | ICD-10-CM | POA: Insufficient documentation

## 2019-01-13 DIAGNOSIS — L900 Lichen sclerosus et atrophicus: Secondary | ICD-10-CM | POA: Diagnosis not present

## 2019-02-13 DIAGNOSIS — Z0000 Encounter for general adult medical examination without abnormal findings: Secondary | ICD-10-CM | POA: Diagnosis not present

## 2019-02-13 DIAGNOSIS — E7800 Pure hypercholesterolemia, unspecified: Secondary | ICD-10-CM | POA: Diagnosis not present

## 2019-02-13 DIAGNOSIS — N183 Chronic kidney disease, stage 3 (moderate): Secondary | ICD-10-CM | POA: Diagnosis not present

## 2019-02-13 DIAGNOSIS — M8588 Other specified disorders of bone density and structure, other site: Secondary | ICD-10-CM | POA: Diagnosis not present

## 2019-02-13 DIAGNOSIS — M1990 Unspecified osteoarthritis, unspecified site: Secondary | ICD-10-CM | POA: Diagnosis not present

## 2019-02-13 DIAGNOSIS — M4800 Spinal stenosis, site unspecified: Secondary | ICD-10-CM | POA: Diagnosis not present

## 2019-02-13 DIAGNOSIS — Z1389 Encounter for screening for other disorder: Secondary | ICD-10-CM | POA: Diagnosis not present

## 2019-02-19 DIAGNOSIS — Z8262 Family history of osteoporosis: Secondary | ICD-10-CM | POA: Diagnosis not present

## 2019-02-19 DIAGNOSIS — Z780 Asymptomatic menopausal state: Secondary | ICD-10-CM | POA: Diagnosis not present

## 2019-02-19 DIAGNOSIS — M8589 Other specified disorders of bone density and structure, multiple sites: Secondary | ICD-10-CM | POA: Diagnosis not present

## 2019-04-14 DIAGNOSIS — M4316 Spondylolisthesis, lumbar region: Secondary | ICD-10-CM | POA: Diagnosis not present

## 2019-04-14 DIAGNOSIS — M48062 Spinal stenosis, lumbar region with neurogenic claudication: Secondary | ICD-10-CM | POA: Diagnosis not present

## 2019-04-14 DIAGNOSIS — M5136 Other intervertebral disc degeneration, lumbar region: Secondary | ICD-10-CM | POA: Diagnosis not present

## 2019-04-14 DIAGNOSIS — M47816 Spondylosis without myelopathy or radiculopathy, lumbar region: Secondary | ICD-10-CM | POA: Diagnosis not present

## 2019-04-14 DIAGNOSIS — M4155 Other secondary scoliosis, thoracolumbar region: Secondary | ICD-10-CM | POA: Diagnosis not present

## 2019-04-16 DIAGNOSIS — M4726 Other spondylosis with radiculopathy, lumbar region: Secondary | ICD-10-CM | POA: Diagnosis not present

## 2019-04-16 DIAGNOSIS — M5116 Intervertebral disc disorders with radiculopathy, lumbar region: Secondary | ICD-10-CM | POA: Diagnosis not present

## 2019-04-16 DIAGNOSIS — M48061 Spinal stenosis, lumbar region without neurogenic claudication: Secondary | ICD-10-CM | POA: Diagnosis not present

## 2019-05-25 DIAGNOSIS — H25813 Combined forms of age-related cataract, bilateral: Secondary | ICD-10-CM | POA: Diagnosis not present

## 2019-05-25 DIAGNOSIS — H04123 Dry eye syndrome of bilateral lacrimal glands: Secondary | ICD-10-CM | POA: Diagnosis not present

## 2019-05-25 DIAGNOSIS — H35361 Drusen (degenerative) of macula, right eye: Secondary | ICD-10-CM | POA: Diagnosis not present

## 2019-08-11 DIAGNOSIS — M48062 Spinal stenosis, lumbar region with neurogenic claudication: Secondary | ICD-10-CM | POA: Diagnosis not present

## 2019-08-11 DIAGNOSIS — Z6827 Body mass index (BMI) 27.0-27.9, adult: Secondary | ICD-10-CM | POA: Diagnosis not present

## 2019-08-11 DIAGNOSIS — M5136 Other intervertebral disc degeneration, lumbar region: Secondary | ICD-10-CM | POA: Diagnosis not present

## 2019-08-11 DIAGNOSIS — M4316 Spondylolisthesis, lumbar region: Secondary | ICD-10-CM | POA: Diagnosis not present

## 2019-08-11 DIAGNOSIS — M4155 Other secondary scoliosis, thoracolumbar region: Secondary | ICD-10-CM | POA: Diagnosis not present

## 2019-08-11 DIAGNOSIS — R030 Elevated blood-pressure reading, without diagnosis of hypertension: Secondary | ICD-10-CM | POA: Diagnosis not present

## 2019-08-11 DIAGNOSIS — M47816 Spondylosis without myelopathy or radiculopathy, lumbar region: Secondary | ICD-10-CM | POA: Diagnosis not present

## 2019-08-19 DIAGNOSIS — Z5181 Encounter for therapeutic drug level monitoring: Secondary | ICD-10-CM | POA: Diagnosis not present

## 2019-08-19 DIAGNOSIS — N183 Chronic kidney disease, stage 3 (moderate): Secondary | ICD-10-CM | POA: Diagnosis not present

## 2019-08-19 DIAGNOSIS — E7800 Pure hypercholesterolemia, unspecified: Secondary | ICD-10-CM | POA: Diagnosis not present

## 2019-08-19 DIAGNOSIS — M545 Low back pain: Secondary | ICD-10-CM | POA: Diagnosis not present

## 2019-08-27 DIAGNOSIS — M4726 Other spondylosis with radiculopathy, lumbar region: Secondary | ICD-10-CM | POA: Diagnosis not present

## 2019-08-27 DIAGNOSIS — M48061 Spinal stenosis, lumbar region without neurogenic claudication: Secondary | ICD-10-CM | POA: Diagnosis not present

## 2019-08-27 DIAGNOSIS — M5136 Other intervertebral disc degeneration, lumbar region: Secondary | ICD-10-CM | POA: Diagnosis not present

## 2019-09-03 DIAGNOSIS — Z23 Encounter for immunization: Secondary | ICD-10-CM | POA: Diagnosis not present

## 2019-12-24 DIAGNOSIS — M48061 Spinal stenosis, lumbar region without neurogenic claudication: Secondary | ICD-10-CM | POA: Diagnosis not present

## 2019-12-24 DIAGNOSIS — M5136 Other intervertebral disc degeneration, lumbar region: Secondary | ICD-10-CM | POA: Diagnosis not present

## 2019-12-24 DIAGNOSIS — M4726 Other spondylosis with radiculopathy, lumbar region: Secondary | ICD-10-CM | POA: Diagnosis not present

## 2020-01-02 ENCOUNTER — Ambulatory Visit: Payer: Medicare Other | Attending: Internal Medicine

## 2020-01-02 DIAGNOSIS — Z23 Encounter for immunization: Secondary | ICD-10-CM

## 2020-01-02 NOTE — Progress Notes (Signed)
   Covid-19 Vaccination Clinic  Name:  Shannon Bowman    MRN: [id number] DOB: 02/15/1932  01/02/2020  Shannon Bowman was observed post Covid-19 immunization for 15 minutes without incidence. She was provided with Vaccine Information Sheet and instruction to access the V-Safe system.   Shannon Bowman was instructed to call 911 with any severe reactions post vaccine: Marland Kitchen Difficulty breathing  . Swelling of your face and throat  . A fast heartbeat  . A bad rash all over your body  . Dizziness and weakness    Immunizations Administered    Name Date Dose VIS Date Route   Pfizer COVID-19 Vaccine 01/02/2020 12:29 PM 0.3 mL 11/20/2019 Intramuscular   Manufacturer: [place]   Lot: [id number]   [place]: [id number]

## 2020-01-23 ENCOUNTER — Ambulatory Visit: Payer: Medicare Other | Attending: Internal Medicine

## 2020-01-23 DIAGNOSIS — Z23 Encounter for immunization: Secondary | ICD-10-CM

## 2020-01-23 NOTE — Progress Notes (Signed)
   Covid-19 Vaccination Clinic  Name:  Shannon Bowman    MRN: [id number] DOB: [birth year]  01/23/2020  Shannon Bowman was observed post Covid-19 immunization for 15 minutes without incidence. She was provided with Vaccine Information Sheet and instruction to access the V-Safe system.   Shannon Bowman was instructed to call 911 with any severe reactions post vaccine: Marland Kitchen Difficulty breathing  . Swelling of your face and throat  . A fast heartbeat  . A bad rash all over your body  . Dizziness and weakness    Immunizations Administered    Name Date Dose VIS Date Route   Pfizer COVID-19 Vaccine 01/23/2020 12:31 PM 0.3 mL 11/20/2019 Intramuscular   Manufacturer: [place]   Lot: [id number]   [place]: [id number]

## 2020-02-19 DIAGNOSIS — M545 Low back pain: Secondary | ICD-10-CM | POA: Diagnosis not present

## 2020-02-19 DIAGNOSIS — E7800 Pure hypercholesterolemia, unspecified: Secondary | ICD-10-CM | POA: Diagnosis not present

## 2020-02-19 DIAGNOSIS — N1831 Chronic kidney disease, stage 3a: Secondary | ICD-10-CM | POA: Diagnosis not present

## 2020-02-19 DIAGNOSIS — Z0000 Encounter for general adult medical examination without abnormal findings: Secondary | ICD-10-CM | POA: Diagnosis not present

## 2020-02-19 DIAGNOSIS — Z1389 Encounter for screening for other disorder: Secondary | ICD-10-CM | POA: Diagnosis not present

## 2020-03-15 DIAGNOSIS — M5136 Other intervertebral disc degeneration, lumbar region: Secondary | ICD-10-CM | POA: Diagnosis not present

## 2020-03-15 DIAGNOSIS — I10 Essential (primary) hypertension: Secondary | ICD-10-CM | POA: Diagnosis not present

## 2020-03-15 DIAGNOSIS — M4155 Other secondary scoliosis, thoracolumbar region: Secondary | ICD-10-CM | POA: Diagnosis not present

## 2020-03-15 DIAGNOSIS — M47816 Spondylosis without myelopathy or radiculopathy, lumbar region: Secondary | ICD-10-CM | POA: Diagnosis not present

## 2020-03-15 DIAGNOSIS — M4316 Spondylolisthesis, lumbar region: Secondary | ICD-10-CM | POA: Diagnosis not present

## 2020-03-15 DIAGNOSIS — M48062 Spinal stenosis, lumbar region with neurogenic claudication: Secondary | ICD-10-CM | POA: Diagnosis not present

## 2020-03-15 DIAGNOSIS — Z6827 Body mass index (BMI) 27.0-27.9, adult: Secondary | ICD-10-CM | POA: Diagnosis not present

## 2020-03-17 DIAGNOSIS — M4726 Other spondylosis with radiculopathy, lumbar region: Secondary | ICD-10-CM | POA: Diagnosis not present

## 2020-03-17 DIAGNOSIS — M5136 Other intervertebral disc degeneration, lumbar region: Secondary | ICD-10-CM | POA: Diagnosis not present

## 2020-03-17 DIAGNOSIS — M48061 Spinal stenosis, lumbar region without neurogenic claudication: Secondary | ICD-10-CM | POA: Diagnosis not present

## 2020-03-21 DIAGNOSIS — L900 Lichen sclerosus et atrophicus: Secondary | ICD-10-CM | POA: Diagnosis not present

## 2020-03-21 DIAGNOSIS — Z6826 Body mass index (BMI) 26.0-26.9, adult: Secondary | ICD-10-CM | POA: Diagnosis not present

## 2020-03-21 DIAGNOSIS — Z01419 Encounter for gynecological examination (general) (routine) without abnormal findings: Secondary | ICD-10-CM | POA: Diagnosis not present

## 2020-03-21 DIAGNOSIS — N952 Postmenopausal atrophic vaginitis: Secondary | ICD-10-CM | POA: Diagnosis not present

## 2020-06-02 ENCOUNTER — Other Ambulatory Visit: Payer: Self-pay

## 2020-06-02 ENCOUNTER — Ambulatory Visit (INDEPENDENT_AMBULATORY_CARE_PROVIDER_SITE_OTHER): Payer: Medicare Other | Admitting: Ophthalmology

## 2020-06-02 ENCOUNTER — Encounter (INDEPENDENT_AMBULATORY_CARE_PROVIDER_SITE_OTHER): Payer: Self-pay | Admitting: Ophthalmology

## 2020-06-02 DIAGNOSIS — H2512 Age-related nuclear cataract, left eye: Secondary | ICD-10-CM

## 2020-06-02 DIAGNOSIS — H2513 Age-related nuclear cataract, bilateral: Secondary | ICD-10-CM | POA: Diagnosis not present

## 2020-06-02 DIAGNOSIS — D3131 Benign neoplasm of right choroid: Secondary | ICD-10-CM | POA: Diagnosis not present

## 2020-06-02 HISTORY — DX: Age-related nuclear cataract, left eye: H25.12

## 2020-06-02 NOTE — Assessment & Plan Note (Signed)
Cataract(s) account for the patient's complaint. I discussed the risks and benefits of cataract surgery. Options were explained to the patient. The patient understands that new glasses may not improve their vision and desires to have cataract surgery. I have recommended follow up with their general eye care doctor for evaluation and consideration of cataract extraction with new intraocular lens insertion. 

## 2020-06-02 NOTE — Progress Notes (Signed)
06/02/2020     CHIEF COMPLAINT Patient presents for Retina Follow Up   HISTORY OF PRESENT ILLNESS: Shannon Bowman is a 86 y.o. female who presents to the clinic today for:   HPI    Retina Follow Up    Diagnosis: Choridal Nevus.  In right eye.  Severity is moderate.  Since onset it is stable.  I, the attending physician,  performed the HPI with the patient and updated documentation appropriately.          Comments    Pt referred by Dr. Katy Fitch for Chordial Nevus OD, R\O tumor. Pt was seen by Dr. Katy Fitch yesterday Pt states vision is stable. Pt states she has occasional pain in OD. Denies FOL and floaters.       Last edited by Tilda Franco on 06/02/2020  1:49 PM. (History)      Referring physician: [facility] [address] [place],  [place] [address]  HISTORICAL INFORMATION:   Selected notes from the MEDICAL RECORD NUMBER       CURRENT MEDICATIONS: Current Outpatient Medications (Ophthalmic Drugs)  Medication Sig  . Propylene Glycol (SYSTANE BALANCE OP) Apply 1 drop to eye at bedtime.   No current facility-administered medications for this visit. (Ophthalmic Drugs)   Current Outpatient Medications (Other)  Medication Sig  . ALPRAZolam (XANAX) 0.25 MG tablet Take 0.25 mg by mouth as needed for anxiety.  . calcium carbonate (OS-CAL) 600 MG TABS Take 600 mg by mouth 2 (two) times daily with a meal.  . cetirizine (ZYRTEC) 10 MG tablet Take 10 mg by mouth daily as needed for allergies.   Marland Kitchen estradiol (ESTRACE) 0.1 MG/GM vaginal cream Place 1 Applicatorful vaginally once a week.  Marland Kitchen glucosamine-chondroitin 500-400 MG tablet Take 1 tablet by mouth 2 (two) times daily.  Marland Kitchen KRILL OIL OMEGA-3 PO Take 1 capsule by mouth daily.  . meloxicam (MOBIC) 7.5 MG tablet Take 7.5 mg by mouth daily.  Marland Kitchen oxyCODONE-acetaminophen (PERCOCET/ROXICET) 5-325 MG per tablet Take 1 tablet by mouth every 4 (four) hours as needed for severe pain.  . Probiotic CAPS Take 2  capsules by mouth daily.   No current facility-administered medications for this visit. (Other)      REVIEW OF SYSTEMS:    ALLERGIES Allergies  Allergen Reactions  . Penicillins Rash    PAST MEDICAL HISTORY Past Medical History:  Diagnosis Date  . Arthritis   . Chronic kidney disease    states Dr. Delfina Redwood told her CKD stage 3  . GERD (gastroesophageal reflux disease)   . History of hiatal hernia   . Spinal stenosis    Past Surgical History:  Procedure Laterality Date  . COLONOSCOPY    . eyelid surgery     to improve drooping of eyelids  . INGUINAL HERNIA REPAIR N/A 07/29/2015   Procedure: LAPAROSCOPIC INGUINAL HERNIA REPAIR WITH MESH;  Surgeon: Greer Pickerel, MD;  Location: [facility];  Service: General;  Laterality: N/A;  . MULTIPLE TOOTH EXTRACTIONS    . TONSILLECTOMY    . UPPER GI ENDOSCOPY      FAMILY HISTORY Family History  Problem Relation Age of Onset  . Stroke Mother   . Diabetes Mother   . Stroke Father     SOCIAL HISTORY Social History   Tobacco Use  . Smoking status: Former Smoker    Packs/day: 0.10    Years: 3.00    Pack years: 0.30    Quit date: 12/11/1963  Years since quitting: 56.5  . Smokeless tobacco: Never Used  Substance Use Topics  . Alcohol use: Yes    Comment: 1/day (wine)  . Drug use: No         OPHTHALMIC EXAM:  Base Eye Exam    Visual Acuity (Snellen - Linear)      Right Left   Dist [place] 20/50 20/40 +   Dist ph [place] 20/30 NI       Tonometry (Tonopen, 1:54 PM)      Right Left   Pressure 19 18       Pupils      Pupils Dark Light Shape React APD   Right PERRL 4 3 Round Brisk None   Left PERRL 4 3 Round Brisk None       Visual Fields (Counting fingers)      Left Right    Full Full       Neuro/Psych    Oriented x3: Yes   Mood/Affect: Normal       Dilation    Both eyes: 1.0% Mydriacyl, 2.5% Phenylephrine @ 1:54 PM        Slit Lamp and Fundus Exam    Slit Lamp Exam      Right Left   Anterior Chamber Deep and  quiet Deep and quiet   Lens Nuclear sclerosis Nuclear sclerosis       Fundus Exam      Right Left   Posterior Vitreous Posterior vitreous detachment Posterior vitreous detachment   Disc Normal Normal   C/D Ratio 0.2 0.2   Macula Early age related macular degeneration, Hard drusen, no macular thickening, no exudates Early age related macular degeneration, Hard drusen, no macular thickening, no exudates   Vessels Normal Normal   Periphery Choroidal  nevus nasal to the optic nerve 3X2 DD size appears flat, no lipofuscin, no atrophy, no subretinal fluid Normal          IMAGING AND PROCEDURES  Imaging and Procedures for 06/02/20  Color Fundus Photography Optos - OU - Both Eyes       Right Eye Progression has no prior data. Disc findings include normal observations. Macula : drusen. Vessels : normal observations. Periphery : normal observations.   Left Eye Progression has no prior data. Disc findings include normal observations. Macula : drusen. Periphery : normal observations.   Notes Moderate to severe medial opacity secondary to the 3.5+ nuclear sclerotic cataract in each eye limits detailed evaluation of choroidal nevus margins OD  Early nevus nasally                ASSESSMENT/PLAN:  Nuclear sclerotic cataract of both eyes Cataract(s) account for the patient's complaint. I discussed the risks and benefits of cataract surgery. Options were explained to the patient. The patient understands that new glasses may not improve their vision and desires to have cataract surgery. I have recommended follow up with their general eye care doctor for evaluation and consideration of cataract extraction with new intraocular lens insertion.      ICD-10-CM   1. Choroidal nevus of right eye  D31.31 Color Fundus Photography Optos - OU - Both Eyes  2. Nuclear sclerotic cataract of both eyes  H25.13     1.  2.  3.  Ophthalmic Meds Ordered this visit:  No orders of the defined  types were placed in this encounter.      Return in about 6 months (around 12/02/2020) for DILATE OU, COLOR FP.  There are no  Patient Instructions on file for this visit.   Explained the diagnoses, plan, and follow up with the patient and they expressed understanding.  Patient expressed understanding of the importance of proper follow up care.   Clent Demark Laurel Harnden M.D. Diseases & Surgery of the Retina and Vitreous Retina & Diabetic [place] 06/02/20     Abbreviations: M myopia (nearsighted); A astigmatism; H hyperopia (farsighted); P presbyopia; Mrx spectacle prescription;  CTL contact lenses; OD right eye; OS left eye; OU both eyes  XT exotropia; ET esotropia; PEK punctate epithelial keratitis; PEE punctate epithelial erosions; DES dry eye syndrome; MGD meibomian gland dysfunction; ATs artificial tears; PFAT's preservative free artificial tears; [place] nuclear sclerotic cataract; PSC posterior subcapsular cataract; ERM epi-retinal membrane; PVD posterior vitreous detachment; RD retinal detachment; DM diabetes mellitus; DR diabetic retinopathy; NPDR non-proliferative diabetic retinopathy; PDR proliferative diabetic retinopathy; CSME clinically significant macular edema; DME diabetic macular edema; dbh dot blot hemorrhages; CWS cotton wool spot; POAG primary open angle glaucoma; C/D cup-to-disc ratio; HVF humphrey visual field; GVF goldmann visual field; OCT optical coherence tomography; IOP intraocular pressure; BRVO Branch retinal vein occlusion; CRVO central retinal vein occlusion; CRAO central retinal artery occlusion; BRAO branch retinal artery occlusion; RT retinal tear; SB scleral buckle; PPV pars plana vitrectomy; VH Vitreous hemorrhage; PRP panretinal laser photocoagulation; IVK intravitreal kenalog; VMT vitreomacular traction; MH Macular hole;  NVD neovascularization of the disc; NVE neovascularization elsewhere; AREDS age related eye disease study; ARMD age related macular degeneration;  POAG primary open angle glaucoma; EBMD epithelial/anterior basement membrane dystrophy; ACIOL anterior chamber intraocular lens; IOL intraocular lens; PCIOL posterior chamber intraocular lens; Phaco/IOL phacoemulsification with intraocular lens placement; [place] photorefractive keratectomy; LASIK laser assisted in situ keratomileusis; HTN hypertension; DM diabetes mellitus; COPD chronic obstructive pulmonary disease

## 2020-06-20 DIAGNOSIS — M4155 Other secondary scoliosis, thoracolumbar region: Secondary | ICD-10-CM | POA: Diagnosis not present

## 2020-06-20 DIAGNOSIS — M48062 Spinal stenosis, lumbar region with neurogenic claudication: Secondary | ICD-10-CM | POA: Diagnosis not present

## 2020-06-20 DIAGNOSIS — M4316 Spondylolisthesis, lumbar region: Secondary | ICD-10-CM | POA: Diagnosis not present

## 2020-06-20 DIAGNOSIS — M545 Low back pain: Secondary | ICD-10-CM | POA: Diagnosis not present

## 2020-06-20 DIAGNOSIS — M5136 Other intervertebral disc degeneration, lumbar region: Secondary | ICD-10-CM | POA: Diagnosis not present

## 2020-06-20 DIAGNOSIS — R030 Elevated blood-pressure reading, without diagnosis of hypertension: Secondary | ICD-10-CM | POA: Diagnosis not present

## 2020-06-20 DIAGNOSIS — Z6826 Body mass index (BMI) 26.0-26.9, adult: Secondary | ICD-10-CM | POA: Diagnosis not present

## 2020-06-20 DIAGNOSIS — M5416 Radiculopathy, lumbar region: Secondary | ICD-10-CM | POA: Diagnosis not present

## 2020-06-30 DIAGNOSIS — M48062 Spinal stenosis, lumbar region with neurogenic claudication: Secondary | ICD-10-CM | POA: Diagnosis not present

## 2020-07-06 DIAGNOSIS — M545 Low back pain: Secondary | ICD-10-CM | POA: Diagnosis not present

## 2020-07-06 DIAGNOSIS — M48062 Spinal stenosis, lumbar region with neurogenic claudication: Secondary | ICD-10-CM | POA: Diagnosis not present

## 2020-07-25 DIAGNOSIS — M4316 Spondylolisthesis, lumbar region: Secondary | ICD-10-CM | POA: Diagnosis not present

## 2020-07-25 DIAGNOSIS — M4155 Other secondary scoliosis, thoracolumbar region: Secondary | ICD-10-CM | POA: Diagnosis not present

## 2020-07-25 DIAGNOSIS — M545 Low back pain: Secondary | ICD-10-CM | POA: Diagnosis not present

## 2020-07-25 DIAGNOSIS — M48062 Spinal stenosis, lumbar region with neurogenic claudication: Secondary | ICD-10-CM | POA: Diagnosis not present

## 2020-07-25 DIAGNOSIS — M5136 Other intervertebral disc degeneration, lumbar region: Secondary | ICD-10-CM | POA: Diagnosis not present

## 2020-07-25 DIAGNOSIS — M5416 Radiculopathy, lumbar region: Secondary | ICD-10-CM | POA: Diagnosis not present

## 2020-08-02 DIAGNOSIS — M545 Low back pain: Secondary | ICD-10-CM | POA: Diagnosis not present

## 2020-08-02 DIAGNOSIS — Z6825 Body mass index (BMI) 25.0-25.9, adult: Secondary | ICD-10-CM | POA: Diagnosis not present

## 2020-08-02 DIAGNOSIS — M5136 Other intervertebral disc degeneration, lumbar region: Secondary | ICD-10-CM | POA: Diagnosis not present

## 2020-08-02 DIAGNOSIS — I10 Essential (primary) hypertension: Secondary | ICD-10-CM | POA: Diagnosis not present

## 2020-08-11 ENCOUNTER — Other Ambulatory Visit: Payer: Self-pay | Admitting: Geriatric Medicine

## 2020-08-11 ENCOUNTER — Other Ambulatory Visit: Payer: Self-pay

## 2020-08-11 ENCOUNTER — Ambulatory Visit
Admission: RE | Admit: 2020-08-11 | Discharge: 2020-08-11 | Disposition: A | Discharge status: Home / Self Care | Payer: Medicare Other | Source: Ambulatory Visit | Attending: Geriatric Medicine | Admitting: Geriatric Medicine

## 2020-08-11 DIAGNOSIS — M25512 Pain in left shoulder: Secondary | ICD-10-CM

## 2020-08-11 DIAGNOSIS — H5789 Other specified disorders of eye and adnexa: Secondary | ICD-10-CM | POA: Diagnosis not present

## 2020-08-11 DIAGNOSIS — S0990XA Unspecified injury of head, initial encounter: Secondary | ICD-10-CM | POA: Diagnosis not present

## 2020-08-11 DIAGNOSIS — M852 Hyperostosis of skull: Secondary | ICD-10-CM | POA: Diagnosis not present

## 2020-08-11 DIAGNOSIS — W19XXXA Unspecified fall, initial encounter: Secondary | ICD-10-CM

## 2020-08-11 DIAGNOSIS — Z043 Encounter for examination and observation following other accident: Secondary | ICD-10-CM | POA: Diagnosis not present

## 2020-08-11 DIAGNOSIS — I6523 Occlusion and stenosis of bilateral carotid arteries: Secondary | ICD-10-CM | POA: Diagnosis not present

## 2020-08-11 DIAGNOSIS — H538 Other visual disturbances: Secondary | ICD-10-CM | POA: Diagnosis not present

## 2020-08-11 DIAGNOSIS — R519 Headache, unspecified: Secondary | ICD-10-CM | POA: Diagnosis not present

## 2020-08-25 DIAGNOSIS — M545 Low back pain: Secondary | ICD-10-CM | POA: Diagnosis not present

## 2020-08-25 DIAGNOSIS — E7800 Pure hypercholesterolemia, unspecified: Secondary | ICD-10-CM | POA: Diagnosis not present

## 2020-08-25 DIAGNOSIS — H25813 Combined forms of age-related cataract, bilateral: Secondary | ICD-10-CM | POA: Diagnosis not present

## 2020-08-25 DIAGNOSIS — Z79899 Other long term (current) drug therapy: Secondary | ICD-10-CM | POA: Diagnosis not present

## 2020-08-25 DIAGNOSIS — N183 Chronic kidney disease, stage 3 unspecified: Secondary | ICD-10-CM | POA: Diagnosis not present

## 2020-08-25 DIAGNOSIS — H353131 Nonexudative age-related macular degeneration, bilateral, early dry stage: Secondary | ICD-10-CM | POA: Diagnosis not present

## 2020-08-25 DIAGNOSIS — F419 Anxiety disorder, unspecified: Secondary | ICD-10-CM | POA: Diagnosis not present

## 2020-08-25 DIAGNOSIS — D3131 Benign neoplasm of right choroid: Secondary | ICD-10-CM | POA: Diagnosis not present

## 2020-08-25 DIAGNOSIS — R5383 Other fatigue: Secondary | ICD-10-CM | POA: Diagnosis not present

## 2020-09-13 DIAGNOSIS — Z23 Encounter for immunization: Secondary | ICD-10-CM | POA: Diagnosis not present

## 2020-09-23 DIAGNOSIS — H25811 Combined forms of age-related cataract, right eye: Secondary | ICD-10-CM | POA: Diagnosis not present

## 2020-09-23 DIAGNOSIS — H52221 Regular astigmatism, right eye: Secondary | ICD-10-CM | POA: Diagnosis not present

## 2020-09-23 DIAGNOSIS — H2511 Age-related nuclear cataract, right eye: Secondary | ICD-10-CM | POA: Diagnosis not present

## 2020-11-08 ENCOUNTER — Other Ambulatory Visit: Payer: Self-pay | Admitting: Oncology

## 2020-11-08 ENCOUNTER — Telehealth (HOSPITAL_COMMUNITY): Payer: Self-pay | Admitting: Emergency Medicine

## 2020-11-08 DIAGNOSIS — U071 COVID-19: Secondary | ICD-10-CM

## 2020-11-08 NOTE — Progress Notes (Signed)
I connected by phone with  Shannon Bowman to discuss the potential use of an new treatment for mild to moderate COVID-19 viral infection in non-hospitalized patients.   This patient is a age/sex that meets the FDA criteria for Emergency Use Authorization of casirivimab\imdevimab.  Has a (+) direct SARS-CoV-2 viral test result 1. Has mild or moderate COVID-19  2. Is ? 84 years of age and weighs ? 40 kg 3. Is NOT hospitalized due to COVID-19 4. Is NOT requiring oxygen therapy or requiring an increase in baseline oxygen flow rate due to COVID-19 5. Is within 10 days of symptom onset 6. Has at least one of the high risk factor(s) for progression to severe COVID-19 and/or hospitalization as defined in EUA. ? Specific high risk criteria : Past Medical History:  Diagnosis Date  . Arthritis   . Chronic kidney disease    states Dr. Delfina Redwood told her CKD stage 3  . GERD (gastroesophageal reflux disease)   . History of hiatal hernia   . Spinal stenosis   ?  ?    Symptom onset 11/06/20   I have spoken and communicated the following to the patient or parent/caregiver:   1. FDA has authorized the emergency use of casirivimab\imdevimab for the treatment of mild to moderate COVID-19 in adults and pediatric patients with positive results of direct SARS-CoV-2 viral testing who are 32 years of age and older weighing at least 40 kg, and who are at high risk for progressing to severe COVID-19 and/or hospitalization.   2. The significant known and potential risks and benefits of casirivimab\imdevimab, and the extent to which such potential risks and benefits are unknown.   3. Information on available alternative treatments and the risks and benefits of those alternatives, including clinical trials.   4. Patients treated with casirivimab\imdevimab should continue to self-isolate and use infection control measures (e.g., wear mask, isolate, social distance, avoid sharing personal items, clean and disinfect  "high touch" surfaces, and frequent handwashing) according to CDC guidelines.    5. The patient or parent/caregiver has the option to accept or refuse casirivimab\imdevimab .   After reviewing this information with the patient, The patient agreed to proceed with receiving casirivimab\imdevimab infusion and will be provided a copy of the Fact sheet prior to receiving the infusion.Rulon Abide, AGNP-C [phone] ([place])

## 2020-11-08 NOTE — Telephone Encounter (Signed)
Called pt and explained possible monoclonal antibody treatment. Sx onset 11/28. Tested positive 11/29 with home test. Test was thrown away. Sx include sore throat, temperature, nasal congestion, coughing, and fatigue. Said she is feeling better. Qualifying risk factors include stage 3 kidney disease. Fully vaccinated with [place] Feb 2021.Pt interested in tx. Informed pt an APP will call back to possibly schedule an appointment.

## 2020-11-09 ENCOUNTER — Ambulatory Visit (HOSPITAL_COMMUNITY)
Admission: RE | Admit: 2020-11-09 | Discharge: 2020-11-09 | Disposition: A | Discharge status: Home / Self Care | Payer: Medicare Other | Source: Ambulatory Visit | Attending: Pulmonary Disease | Admitting: Pulmonary Disease

## 2020-11-09 DIAGNOSIS — U071 COVID-19: Secondary | ICD-10-CM

## 2020-11-09 DIAGNOSIS — Z23 Encounter for immunization: Secondary | ICD-10-CM | POA: Insufficient documentation

## 2020-11-09 MED ORDER — SOTROVIMAB 500 MG/8ML IV SOLN
500.0000 mg | Freq: Once | INTRAVENOUS | Status: AC
  Administered 2020-11-09: 500 mg via INTRAVENOUS

## 2020-11-09 MED ORDER — METHYLPREDNISOLONE SODIUM SUCC 125 MG IJ SOLR: 125.0000 mg | Freq: Once | INTRAMUSCULAR | Status: DC | PRN

## 2020-11-09 MED ORDER — FAMOTIDINE IN NACL 20-0.9 MG/50ML-% IV SOLN: 20.0000 mg | Freq: Once | INTRAVENOUS | Status: DC | PRN

## 2020-11-09 MED ORDER — DIPHENHYDRAMINE HCL 50 MG/ML IJ SOLN: 50.0000 mg | Freq: Once | INTRAMUSCULAR | Status: DC | PRN

## 2020-11-09 MED ORDER — ALBUTEROL SULFATE HFA 108 (90 BASE) MCG/ACT IN AERS: 2.0000 | INHALATION_SPRAY | Freq: Once | RESPIRATORY_TRACT | Status: DC | PRN

## 2020-11-09 MED ORDER — SODIUM CHLORIDE 0.9 % IV SOLN: INTRAVENOUS | Status: DC | PRN

## 2020-11-09 MED ORDER — EPINEPHRINE 0.3 MG/0.3ML IJ SOAJ: 0.3000 mg | Freq: Once | INTRAMUSCULAR | Status: DC | PRN

## 2020-11-09 NOTE — Progress Notes (Signed)
Diagnosis: COVID-19  Physician: Dr. Patrick Wright  Procedure: [facility] Med: Sotrovimab infusion - Provided patient with sotrovimab fact sheet for patients, parents, and caregivers prior to infusion.   Complications: No immediate complications noted  Discharge: Discharged home    

## 2020-11-09 NOTE — Discharge Instructions (Signed)
10 Things You Can Do to Manage Your COVID-19 Symptoms at Home If you have possible or confirmed COVID-19: 1. Stay home from work and school. And stay away from other public places. If you must go out, avoid using any kind of public transportation, ridesharing, or taxis. 2. Monitor your symptoms carefully. If your symptoms get worse, call your healthcare provider immediately. 3. Get rest and stay hydrated. 4. If you have a medical appointment, call the healthcare provider ahead of time and tell them that you have or may have COVID-19. 5. For medical emergencies, call 911 and notify the dispatch personnel that you have or may have COVID-19. 6. Cover your cough and sneezes with a tissue or use the inside of your elbow. 7. Wash your hands often with soap and water for at least 20 seconds or clean your hands with an alcohol-based hand sanitizer that contains at least 60% alcohol. 8. As much as possible, stay in a specific room and away from other people in your home. Also, you should use a separate bathroom, if available. If you need to be around other people in or outside of the home, wear a mask. 9. Avoid sharing personal items with other people in your household, like dishes, towels, and bedding. 10. Clean all surfaces that are touched often, like counters, tabletops, and doorknobs. Use household cleaning sprays or wipes according to the label instructions. cdc.gov/coronavirus 06/10/2019 This information is not intended to replace advice given to you by your health care provider. Make sure you discuss any questions you have with your health care provider. Document Revised: 11/12/2019 Document Reviewed: 11/12/2019 Elsevier Patient Education  2020 Elsevier Inc. What types of side effects do monoclonal antibody drugs cause?  Common side effects  In general, the more common side effects caused by monoclonal antibody drugs include: . Allergic reactions, such as hives or itching . Flu-like signs and  symptoms, including chills, fatigue, fever, and muscle aches and pains . Nausea, vomiting . Diarrhea . Skin rashes . Low blood pressure   The CDC is recommending patients who receive monoclonal antibody treatments wait at least 90 days before being vaccinated.  Currently, there are no data on the safety and efficacy of mRNA COVID-19 vaccines in persons who received monoclonal antibodies or convalescent plasma as part of COVID-19 treatment. Based on the estimated half-life of such therapies as well as evidence suggesting that reinfection is uncommon in the 90 days after initial infection, vaccination should be deferred for at least 90 days, as a precautionary measure until additional information becomes available, to avoid interference of the antibody treatment with vaccine-induced immune responses. If you have any questions or concerns after the infusion please call the Advanced Practice Provider on call at [phone]. This number is ONLY intended for your use regarding questions or concerns about the infusion post-treatment side-effects.  Please do not provide this number to others for use. For return to work notes please contact your primary care provider.   If someone you know is interested in receiving treatment please have them call the COVID hotline at [phone].   

## 2020-11-09 NOTE — Progress Notes (Signed)
Patient reviewed Fact Sheet for Patients, Parents, and Caregivers for Emergency Use Authorization (EUA) of Sotrovimab for the Treatment of Coronavirus. Patient also reviewed and is agreeable to the estimated cost of treatment. Patient is agreeable to proceed.   

## 2020-11-25 DIAGNOSIS — M25512 Pain in left shoulder: Secondary | ICD-10-CM | POA: Diagnosis not present

## 2020-12-05 ENCOUNTER — Encounter (INDEPENDENT_AMBULATORY_CARE_PROVIDER_SITE_OTHER): Payer: Medicare Other | Admitting: Ophthalmology

## 2020-12-13 ENCOUNTER — Encounter (INDEPENDENT_AMBULATORY_CARE_PROVIDER_SITE_OTHER): Payer: Medicare Other | Admitting: Ophthalmology

## 2020-12-18 IMAGING — DX DG SHOULDER 2+V*L*
3 series · 3 of 3 positions shown · non-contrast
Comparison: None.

CLINICAL DATA: Left anterior shoulder pain.  Fall.

EXAM:
LEFT SHOULDER - 2+ VIEW

[dg shoulder left (1 of 3)]
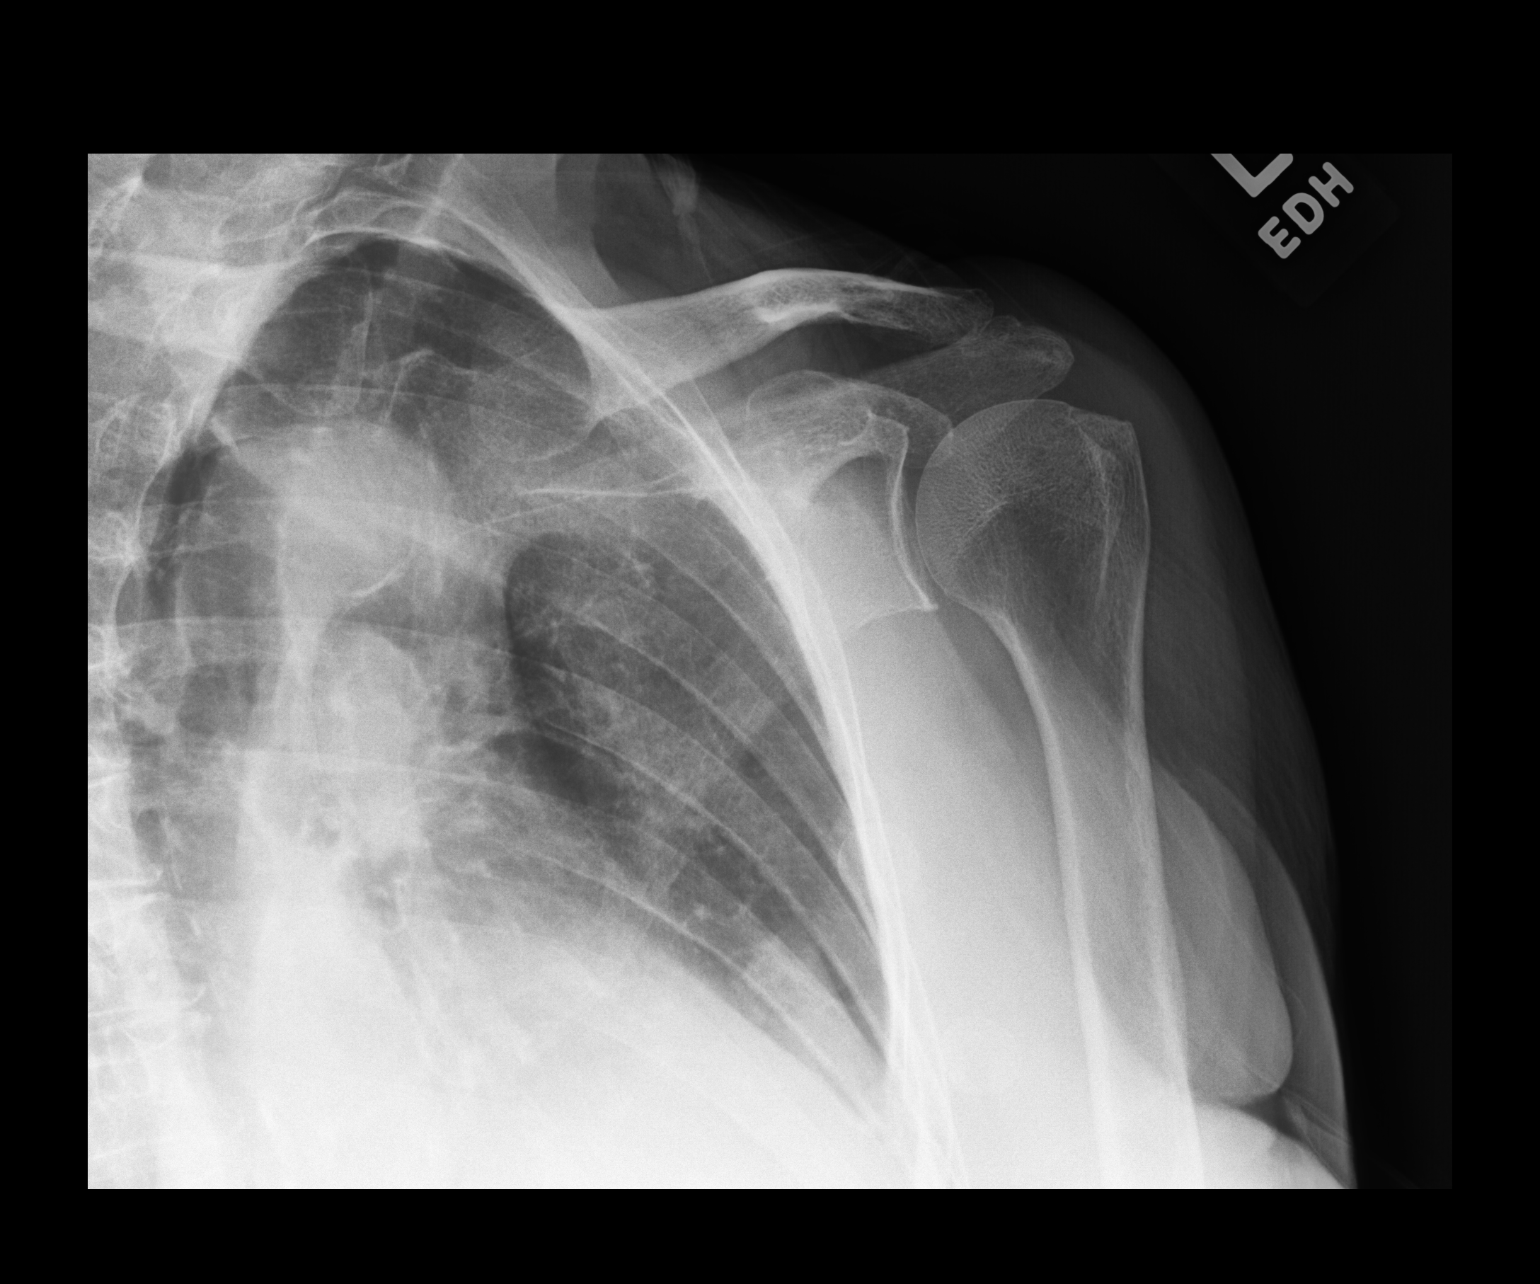

[dg shoulder left (2 of 3)]
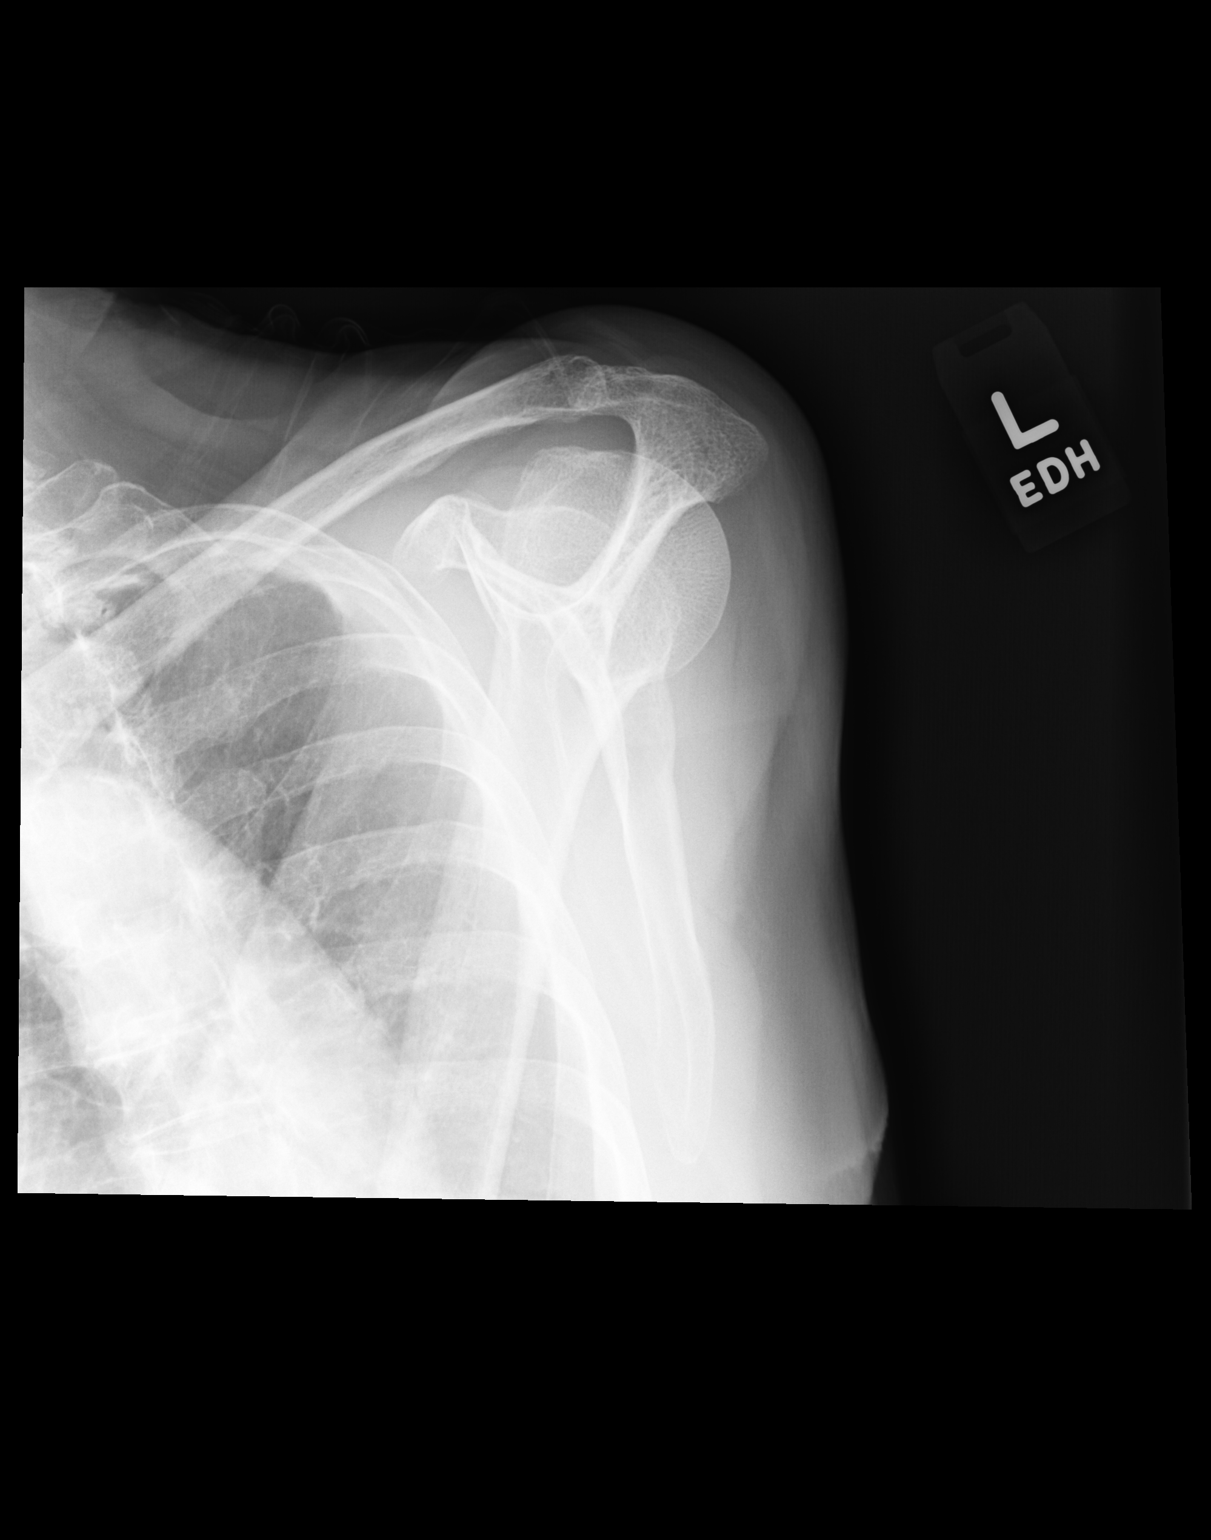

[dg shoulder left (3 of 3)]
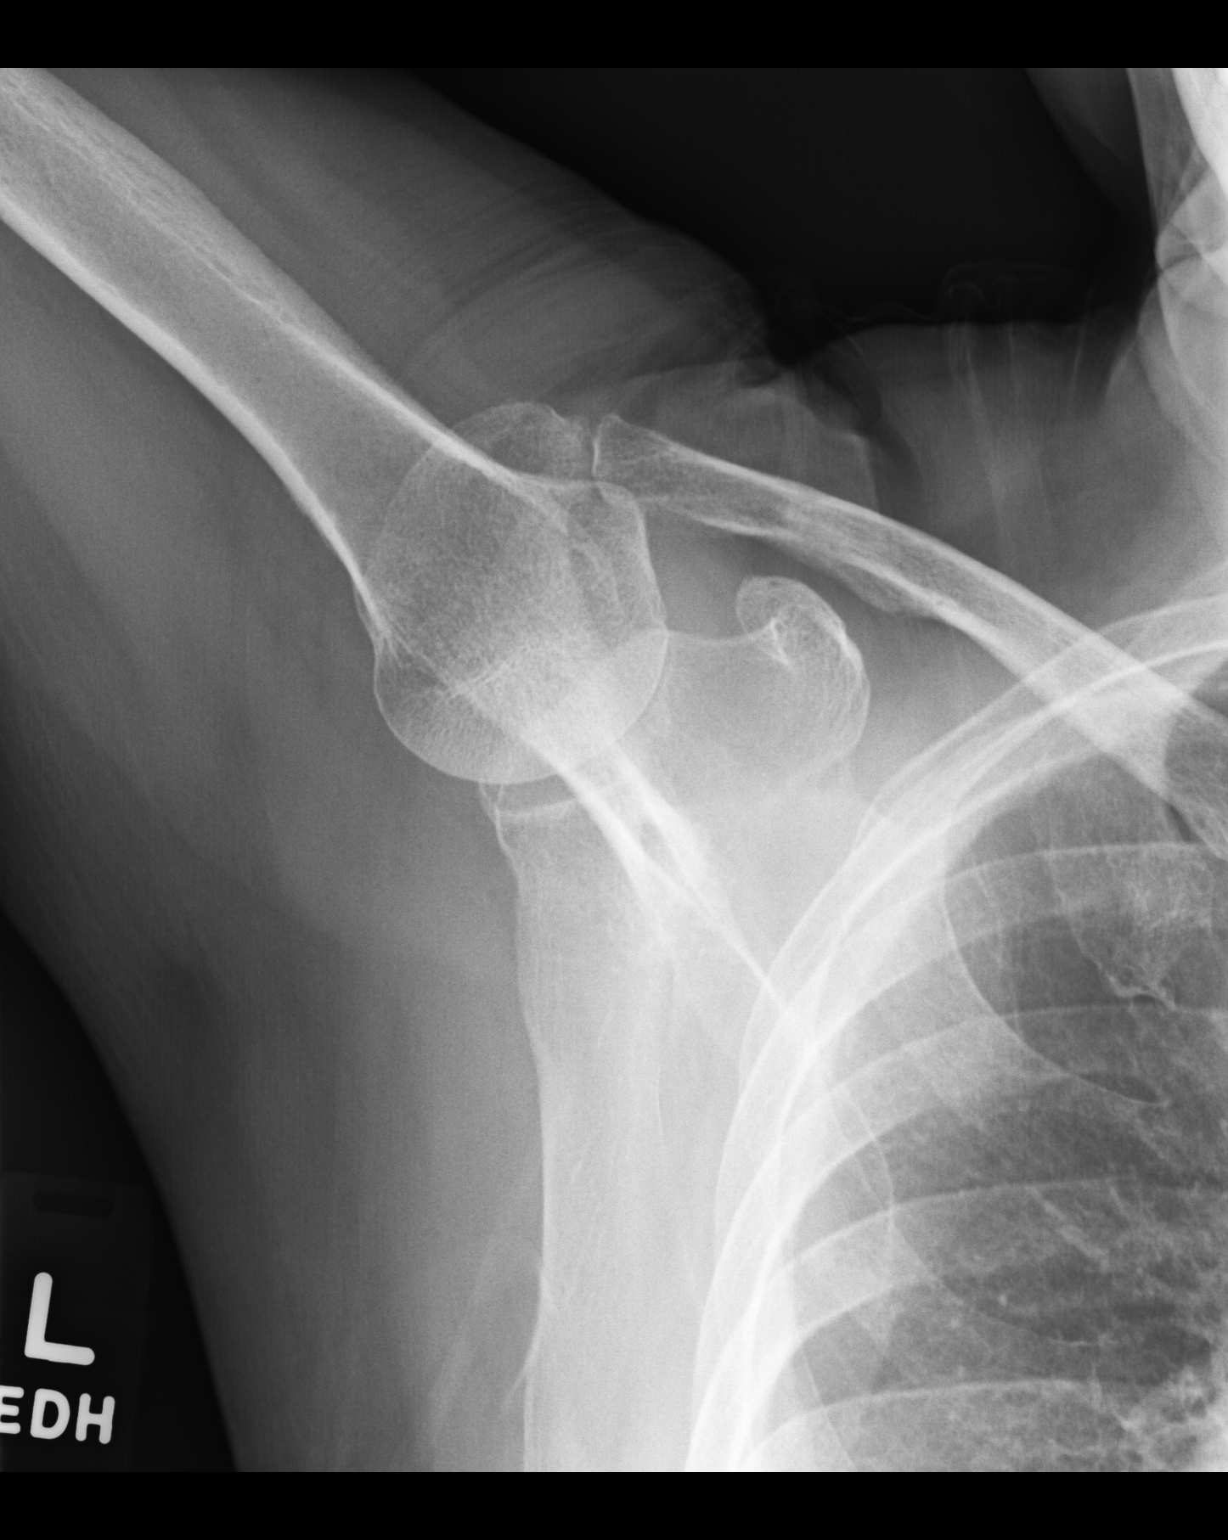

[3 of 3 positions shown; findings below may reference images not displayed]

FINDINGS: There is no evidence of fracture or dislocation. There is no
evidence of arthropathy or other focal bone abnormality. Soft
tissues are unremarkable.
IMPRESSION: Negative.

## 2020-12-18 IMAGING — CT CT HEAD W/O CM
4 series · 15 of 47 positions shown, 17 images · non-contrast
Comparison: None

CLINICAL DATA: Fell 5 days ago striking LEFT frontal head,
swelling, bruising to head/eyes/nose, dizziness, LEFT eye pain

EXAM:
CT HEAD WITHOUT CONTRAST
TECHNIQUE: Contiguous axial images were obtained from the base of the skull
through the vertex without intravenous contrast. Sagittal and
coronal MPR images reconstructed from axial data set.

[Series 2: head 5.00 hr40 s3 axial ibhc · axial · 0.41mm/px · z∈[-646,-546]mm · 6 of 30 slices shown, 8 images]
[im 5/30  brain]
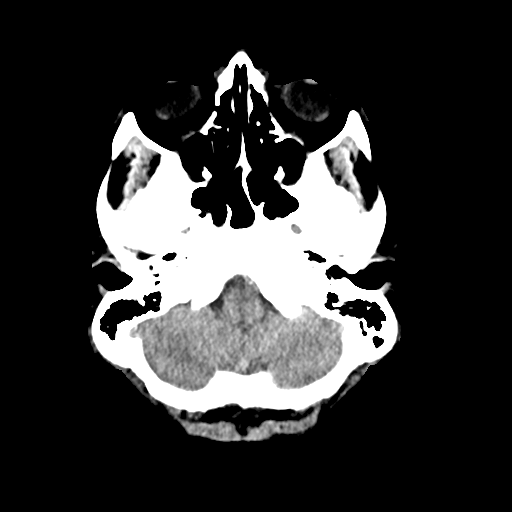
[im 5/30  bone]
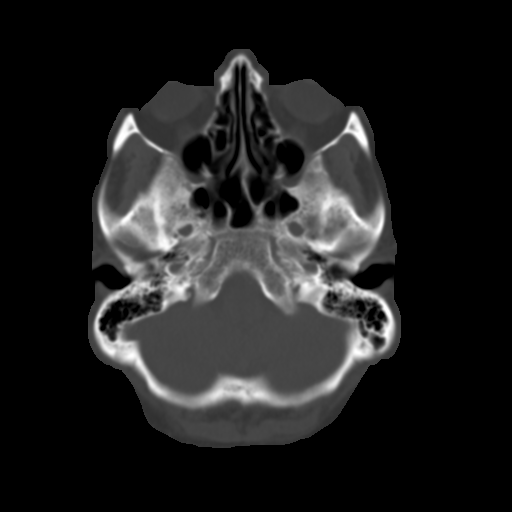
[im 9/30  brain]
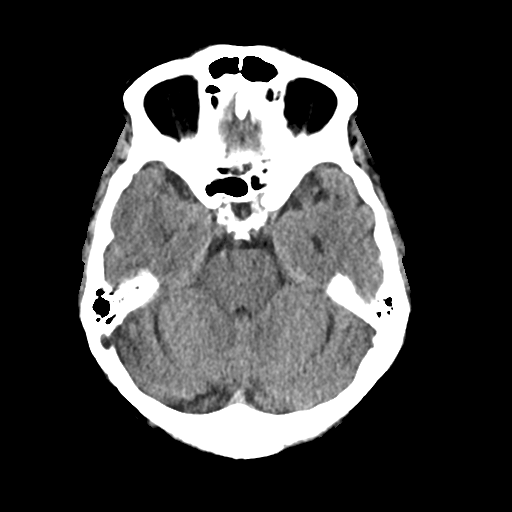
[im 13/30  brain]
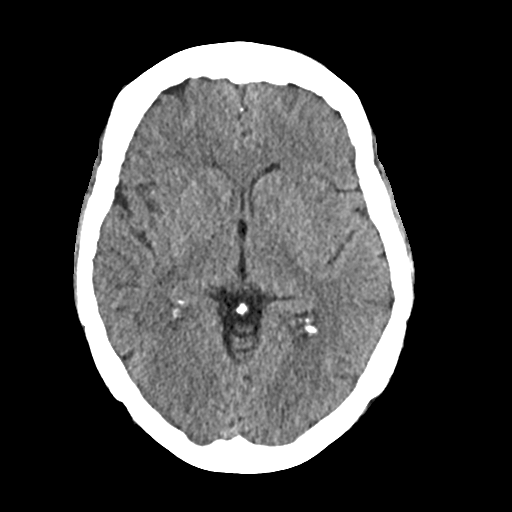
[im 17/30  brain]
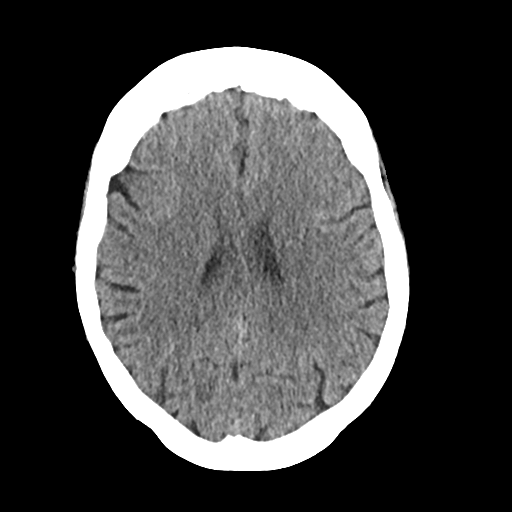
[im 21/30  brain]
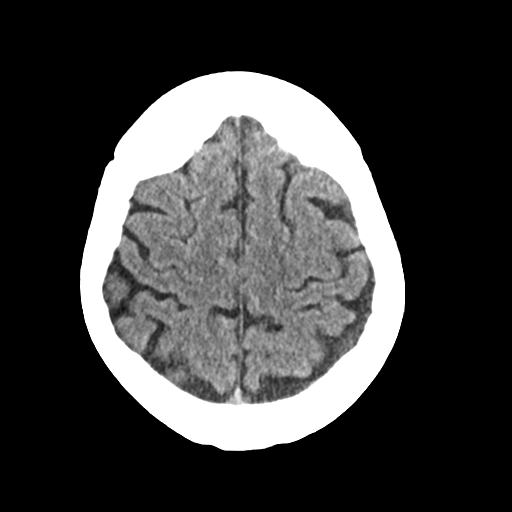
[im 21/30  bone]
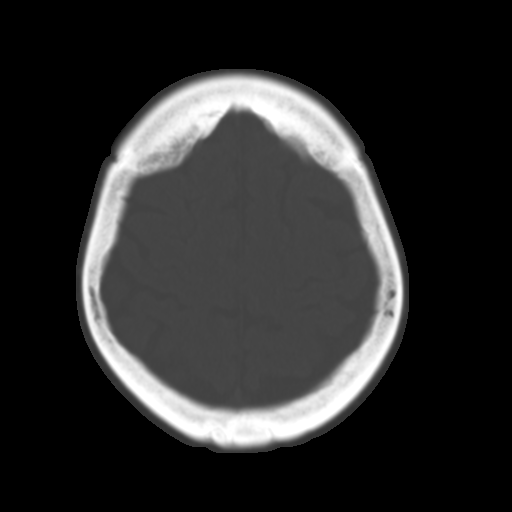
[im 25/30  brain]
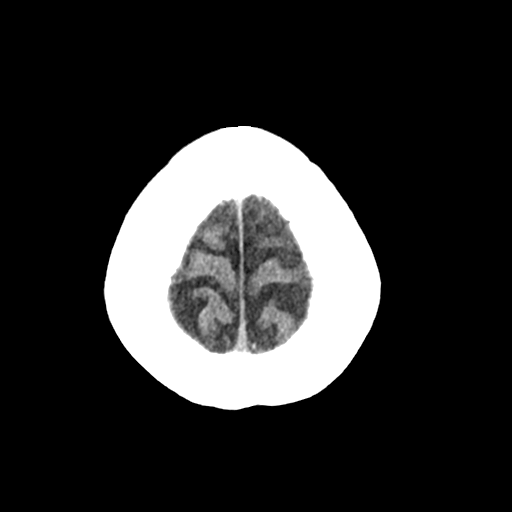

[Series 3: head 2.00 hr60 s3 axial bone · axial · 0.41mm/px · z∈[-653,-617]mm · 3 of 76 slices shown]
[im 8/76  bone]
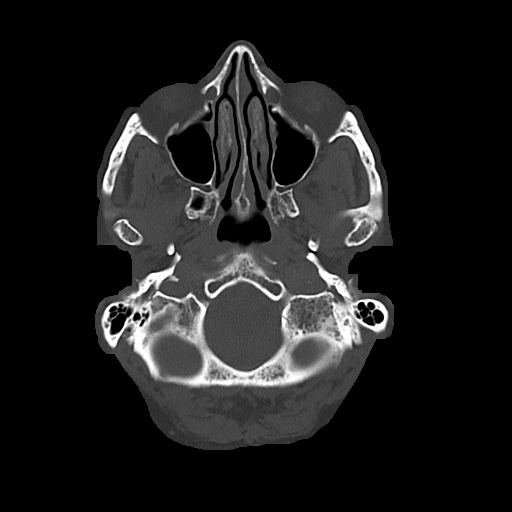
[im 15/76  bone]
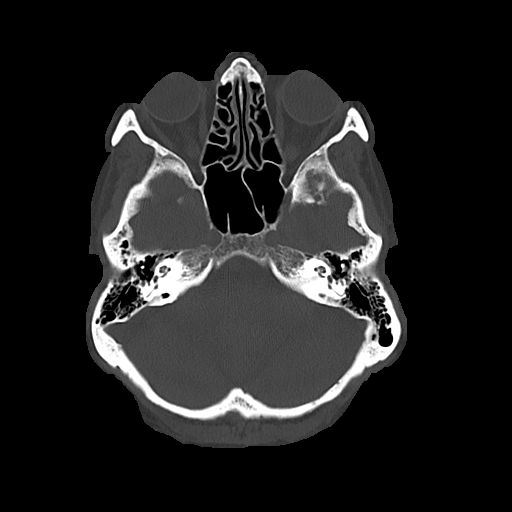
[im 26/76  bone]
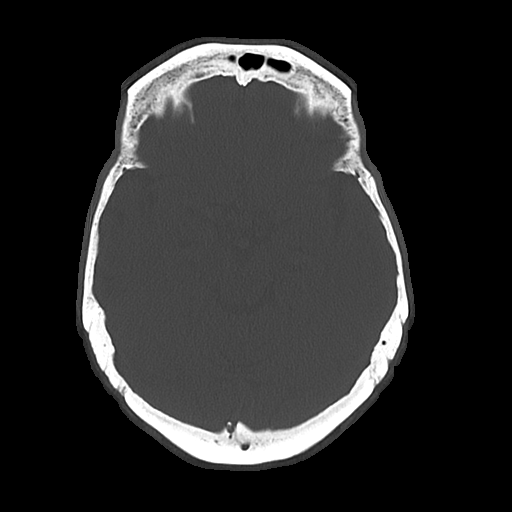

[Series 4: head 3.00 hr40 s3 sag · sagittal · 0.30mm/px · 3 of 103 slices shown]
[im 35/103  brain]
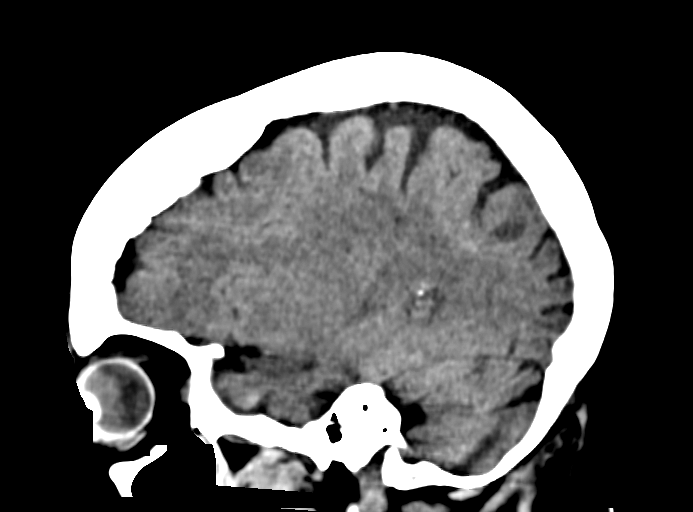
[im 52/103  brain]
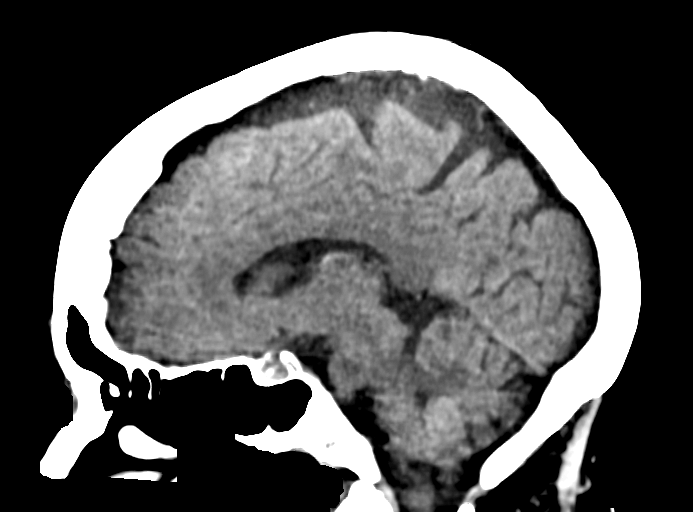
[im 69/103  brain]
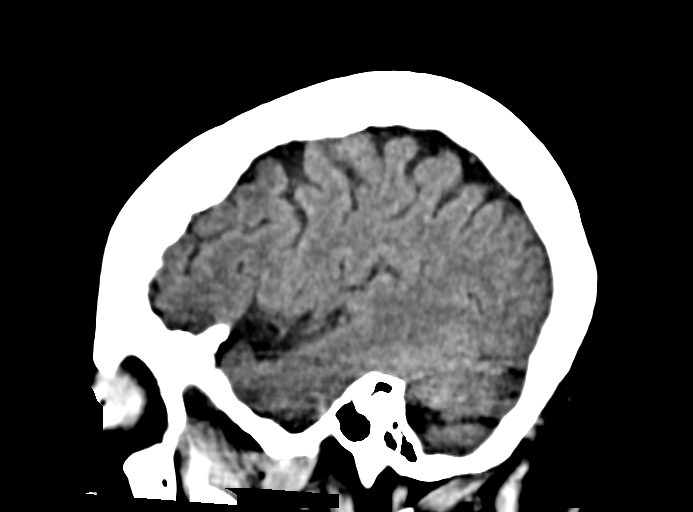

[Series 6: head 3.00 hr40 s3 cor · coronal · 0.30mm/px · 3 of 103 slices shown]
[im 35/103  brain]
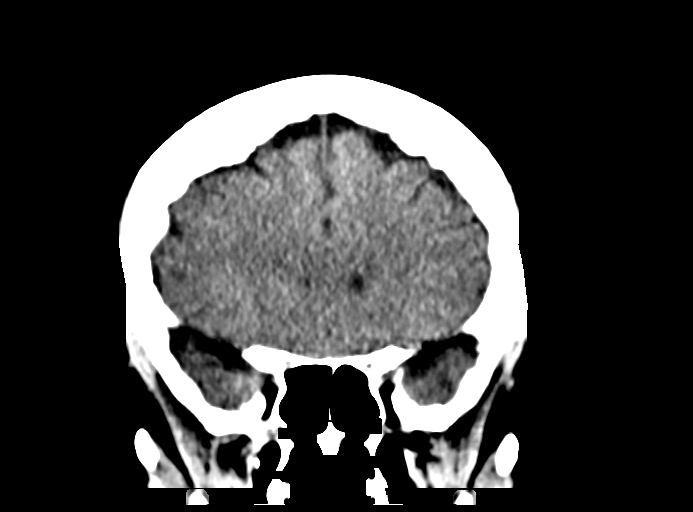
[im 46/103  brain]
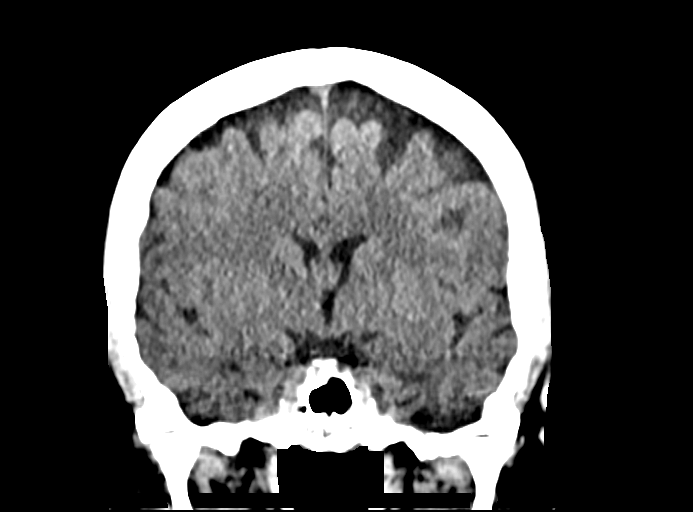
[im 57/103  brain]
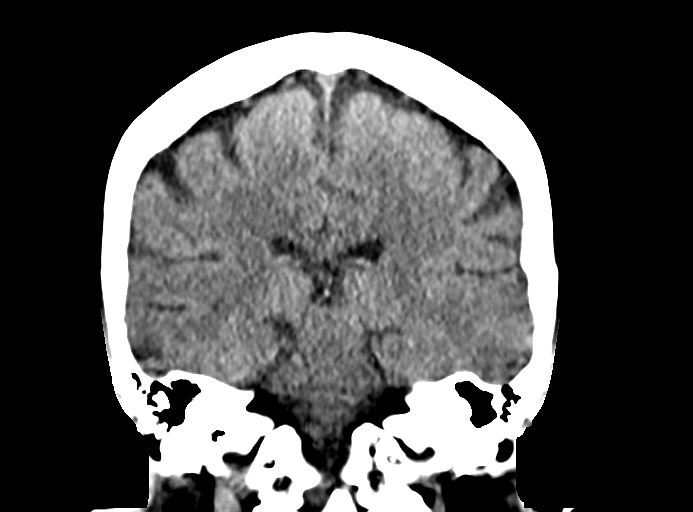

[15 of 47 positions shown; findings below may reference images not displayed]

FINDINGS: Brain: Normal ventricular morphology. No midline shift or mass
effect. Normal appearance of brain parenchyma. No intracranial
hemorrhage, mass lesion, or evidence of acute infarction. No
extra-axial fluid collections.

Vascular: No hyperdense vessels. Mild atherosclerotic calcification
of internal carotid arteries bilaterally at skull base

Skull: Skull intact.  Mild hyperostosis frontalis interna.

Sinuses/Orbits: Visualized paranasal sinuses and mastoid air cells
clear

Other: N/A
IMPRESSION: No acute intracranial abnormalities.

## 2020-12-22 DIAGNOSIS — M5416 Radiculopathy, lumbar region: Secondary | ICD-10-CM | POA: Diagnosis not present

## 2021-01-03 ENCOUNTER — Ambulatory Visit (INDEPENDENT_AMBULATORY_CARE_PROVIDER_SITE_OTHER): Payer: Medicare Other | Admitting: Ophthalmology

## 2021-01-03 ENCOUNTER — Encounter (INDEPENDENT_AMBULATORY_CARE_PROVIDER_SITE_OTHER): Payer: Self-pay | Admitting: Ophthalmology

## 2021-01-03 ENCOUNTER — Other Ambulatory Visit: Payer: Self-pay

## 2021-01-03 DIAGNOSIS — H2512 Age-related nuclear cataract, left eye: Secondary | ICD-10-CM | POA: Diagnosis not present

## 2021-01-03 DIAGNOSIS — Z961 Presence of intraocular lens: Secondary | ICD-10-CM | POA: Diagnosis not present

## 2021-01-03 DIAGNOSIS — D3131 Benign neoplasm of right choroid: Secondary | ICD-10-CM | POA: Diagnosis not present

## 2021-01-03 DIAGNOSIS — H2513 Age-related nuclear cataract, bilateral: Secondary | ICD-10-CM | POA: Diagnosis not present

## 2021-01-03 NOTE — Assessment & Plan Note (Signed)
Left eye scheduled for cataract surgery Dr. Carolynn Sayers end of January 2022.

## 2021-01-03 NOTE — Assessment & Plan Note (Signed)
No interval change, will continue to monitor

## 2021-01-03 NOTE — Progress Notes (Signed)
01/03/2021     CHIEF COMPLAINT Patient presents for Retina Follow Up (6 MO FU OU, pt had CAT SX around 06/2020 OD w/ [facility]////Pt reports difficulty reading OU, eyestrain OU, pt denies any F/F, pain, or pressure OU. /)   HISTORY OF PRESENT ILLNESS: Shannon Bowman is a 87 y.o. female who presents to the clinic today for:   HPI    Retina Follow Up    Patient presents with  Other.  In right eye.  This started 6 months ago.  Duration of 6 months. Additional comments: 6 MO FU OU, pt had CAT SX around 06/2020 OD w/ [facility]    Pt reports difficulty reading OU, eyestrain OU, pt denies any F/F, pain, or pressure OU.         Last edited by Nichola Sizer D on 01/03/2021  2:17 PM. (History)      Referring physician: Seward Carol, MD [place] [place],  [place] [address]  HISTORICAL INFORMATION:   Selected notes from the MEDICAL RECORD NUMBER       CURRENT MEDICATIONS: Current Outpatient Medications (Ophthalmic Drugs)  Medication Sig  . Propylene Glycol (SYSTANE BALANCE OP) Apply 1 drop to eye at bedtime.   No current facility-administered medications for this visit. (Ophthalmic Drugs)   Current Outpatient Medications (Other)  Medication Sig  . ALPRAZolam (XANAX) 0.25 MG tablet Take 0.25 mg by mouth as needed for anxiety.  . calcium carbonate (OS-CAL) 600 MG TABS Take 600 mg by mouth 2 (two) times daily with a meal.  . cetirizine (ZYRTEC) 10 MG tablet Take 10 mg by mouth daily as needed for allergies.   Marland Kitchen estradiol (ESTRACE) 0.1 MG/GM vaginal cream Place 1 Applicatorful vaginally once a week.  Marland Kitchen glucosamine-chondroitin 500-400 MG tablet Take 1 tablet by mouth 2 (two) times daily.  Marland Kitchen KRILL OIL OMEGA-3 PO Take 1 capsule by mouth daily.  . meloxicam (MOBIC) 7.5 MG tablet Take 7.5 mg by mouth daily.  Marland Kitchen oxyCODONE-acetaminophen (PERCOCET/ROXICET) 5-325 MG per tablet Take 1 tablet by mouth every 4 (four) hours as needed for severe pain.  . Probiotic CAPS Take 2  capsules by mouth daily.   No current facility-administered medications for this visit. (Other)      REVIEW OF SYSTEMS:    ALLERGIES Allergies  Allergen Reactions  . Penicillins Rash    PAST MEDICAL HISTORY Past Medical History:  Diagnosis Date  . Arthritis   . Chronic kidney disease    states Dr. Delfina Redwood told her CKD stage 3  . GERD (gastroesophageal reflux disease)   . History of hiatal hernia   . Spinal stenosis    Past Surgical History:  Procedure Laterality Date  . COLONOSCOPY    . eyelid surgery     to improve drooping of eyelids  . INGUINAL HERNIA REPAIR N/A 07/29/2015   Procedure: LAPAROSCOPIC INGUINAL HERNIA REPAIR WITH MESH;  Surgeon: Greer Pickerel, MD;  Location: [facility];  Service: General;  Laterality: N/A;  . MULTIPLE TOOTH EXTRACTIONS    . TONSILLECTOMY    . UPPER GI ENDOSCOPY      FAMILY HISTORY Family History  Problem Relation Age of Onset  . Stroke Mother   . Diabetes Mother   . Stroke Father     SOCIAL HISTORY Social History   Tobacco Use  . Smoking status: Former Smoker    Packs/day: 0.10    Years: 3.00    Pack years: 0.30    Quit date: 12/11/1963  Years since quitting: 57.1  . Smokeless tobacco: Never Used  Substance Use Topics  . Alcohol use: Yes    Comment: 1/day (wine)  . Drug use: No         OPHTHALMIC EXAM: Base Eye Exam    Visual Acuity (ETDRS)      Right Left   Dist [place] 20/30 -2 20/40 -2   Dist ph [place] 20/25 -1 20/25 -2       Tonometry (Tonopen, 2:23 PM)      Right Left   Pressure 16 22  Squinting       Pupils      Pupils Dark Light Shape React APD   Right PERRL 4 3 Round Brisk None   Left PERRL 4 3 Round Brisk None       Visual Fields (Counting fingers)      Left Right    Full Full       Extraocular Movement      Right Left    Full Full       Neuro/Psych    Oriented x3: Yes   Mood/Affect: Normal       Dilation    Both eyes: 1.0% Mydriacyl, 2.5% Phenylephrine @ 2:23 PM        Slit Lamp and  Fundus Exam    Slit Lamp Exam      Right Left   Lids/Lashes Normal Normal   Conjunctiva/Sclera White and quiet White and quiet   Cornea Clear Clear   Anterior Chamber Deep and quiet Deep and quiet   Iris Round and reactive Round and reactive   Lens Centered posterior chamber intraocular lens Nuclear sclerosis 3+   Anterior Vitreous Normal Normal       Fundus Exam      Right Left   Posterior Vitreous Posterior vitreous detachment Posterior vitreous detachment   Disc Normal Normal   C/D Ratio 0.2 0.2   Macula Early age related macular degeneration, Hard drusen, no macular thickening, no exudates Early age related macular degeneration, Hard drusen, no macular thickening, no exudates   Vessels Normal Normal   Periphery Choroidal  nevus nasal to the optic nerve 3X2 DD size appears flat, no lipofuscin, no atrophy, no subretinal fluid Normal          IMAGING AND PROCEDURES  Imaging and Procedures for 01/03/21  Color Fundus Photography Optos - OU - Both Eyes       Right Eye Progression has been stable. Disc findings include normal observations. Macula : drusen. Vessels : normal observations. Periphery : normal observations.   Left Eye Progression has no prior data. Disc findings include normal observations. Macula : drusen. Periphery : normal observations.   Notes Clear media OD, stable nevus change  Early nevus nasally  Dense yellow filter left eye secondary to nuclear sclerotic cataract still on treated                ASSESSMENT/PLAN:  Cataract, nuclear sclerotic, left eye Left eye scheduled for cataract surgery Dr. Carolynn Sayers end of January 2022.  Pseudophakia, right eye Status post surgery right eye July 2021 looks great  Choroidal nevus of right eye No interval change, will continue to monitor      ICD-10-CM   1. Nuclear sclerotic cataract of both eyes  H25.13 Color Fundus Photography Optos - OU - Both Eyes  2. Cataract, nuclear sclerotic, left eye  H25.12    3. Pseudophakia, right eye  Z96.1   4. Choroidal nevus of right eye  D31.31     1.  OD, clear media now that cataract surgery has been performed.  Stable choroidal nevus.  No interval change.  Will observe follow-up in 1 year  2.  Schedule for cataract surgery Dr. Nathanial Rancher that the end of J February 2022  3.  Ophthalmic Meds Ordered this visit:  No orders of the defined types were placed in this encounter.      Return in about 1 year (around 01/03/2022) for COLOR FP, OCT, DILATE OU.  There are no Patient Instructions on file for this visit.   Explained the diagnoses, plan, and follow up with the patient and they expressed understanding.  Patient expressed understanding of the importance of proper follow up care.   Clent Demark Sheresa Cullop M.D. Diseases & Surgery of the Retina and Vitreous [facility] [place] 01/03/21     Abbreviations: M myopia (nearsighted); A astigmatism; H hyperopia (farsighted); P presbyopia; Mrx spectacle prescription;  CTL contact lenses; OD right eye; OS left eye; OU both eyes  XT exotropia; ET esotropia; PEK punctate epithelial keratitis; PEE punctate epithelial erosions; DES dry eye syndrome; MGD meibomian gland dysfunction; ATs artificial tears; PFAT's preservative free artificial tears; [place] nuclear sclerotic cataract; PSC posterior subcapsular cataract; ERM epi-retinal membrane; PVD posterior vitreous detachment; RD retinal detachment; DM diabetes mellitus; DR diabetic retinopathy; NPDR non-proliferative diabetic retinopathy; PDR proliferative diabetic retinopathy; CSME clinically significant macular edema; DME diabetic macular edema; dbh dot blot hemorrhages; CWS cotton wool spot; POAG primary open angle glaucoma; C/D cup-to-disc ratio; HVF humphrey visual field; GVF goldmann visual field; OCT optical coherence tomography; IOP intraocular pressure; BRVO Branch retinal vein occlusion; CRVO central retinal vein occlusion; CRAO central retinal artery  occlusion; BRAO branch retinal artery occlusion; RT retinal tear; SB scleral buckle; PPV pars plana vitrectomy; VH Vitreous hemorrhage; PRP panretinal laser photocoagulation; IVK intravitreal kenalog; VMT vitreomacular traction; MH Macular hole;  NVD neovascularization of the disc; NVE neovascularization elsewhere; AREDS age related eye disease study; ARMD age related macular degeneration; POAG primary open angle glaucoma; EBMD epithelial/anterior basement membrane dystrophy; ACIOL anterior chamber intraocular lens; IOL intraocular lens; PCIOL posterior chamber intraocular lens; Phaco/IOL phacoemulsification with intraocular lens placement; [place] photorefractive keratectomy; LASIK laser assisted in situ keratomileusis; HTN hypertension; DM diabetes mellitus; COPD chronic obstructive pulmonary disease

## 2021-01-03 NOTE — Assessment & Plan Note (Signed)
Status post surgery right eye July 2021 looks great

## 2021-01-12 DIAGNOSIS — M48062 Spinal stenosis, lumbar region with neurogenic claudication: Secondary | ICD-10-CM | POA: Diagnosis not present

## 2021-01-12 DIAGNOSIS — M5137 Other intervertebral disc degeneration, lumbosacral region: Secondary | ICD-10-CM | POA: Diagnosis not present

## 2021-02-16 DIAGNOSIS — H25812 Combined forms of age-related cataract, left eye: Secondary | ICD-10-CM | POA: Diagnosis not present

## 2021-02-16 DIAGNOSIS — Z961 Presence of intraocular lens: Secondary | ICD-10-CM | POA: Diagnosis not present

## 2021-02-22 DIAGNOSIS — H2512 Age-related nuclear cataract, left eye: Secondary | ICD-10-CM | POA: Diagnosis not present

## 2021-02-23 DIAGNOSIS — M545 Low back pain, unspecified: Secondary | ICD-10-CM | POA: Diagnosis not present

## 2021-02-23 DIAGNOSIS — F4320 Adjustment disorder, unspecified: Secondary | ICD-10-CM | POA: Diagnosis not present

## 2021-02-23 DIAGNOSIS — M8588 Other specified disorders of bone density and structure, other site: Secondary | ICD-10-CM | POA: Diagnosis not present

## 2021-02-23 DIAGNOSIS — E7800 Pure hypercholesterolemia, unspecified: Secondary | ICD-10-CM | POA: Diagnosis not present

## 2021-02-23 DIAGNOSIS — Z0000 Encounter for general adult medical examination without abnormal findings: Secondary | ICD-10-CM | POA: Diagnosis not present

## 2021-02-23 DIAGNOSIS — N1831 Chronic kidney disease, stage 3a: Secondary | ICD-10-CM | POA: Diagnosis not present

## 2021-02-23 DIAGNOSIS — Z1389 Encounter for screening for other disorder: Secondary | ICD-10-CM | POA: Diagnosis not present

## 2021-02-23 DIAGNOSIS — Z5181 Encounter for therapeutic drug level monitoring: Secondary | ICD-10-CM | POA: Diagnosis not present

## 2021-02-24 DIAGNOSIS — H25812 Combined forms of age-related cataract, left eye: Secondary | ICD-10-CM | POA: Diagnosis not present

## 2021-04-17 DIAGNOSIS — M85851 Other specified disorders of bone density and structure, right thigh: Secondary | ICD-10-CM | POA: Diagnosis not present

## 2021-04-17 DIAGNOSIS — M85852 Other specified disorders of bone density and structure, left thigh: Secondary | ICD-10-CM | POA: Diagnosis not present

## 2021-05-04 DIAGNOSIS — M48062 Spinal stenosis, lumbar region with neurogenic claudication: Secondary | ICD-10-CM | POA: Diagnosis not present

## 2021-05-31 DIAGNOSIS — M5416 Radiculopathy, lumbar region: Secondary | ICD-10-CM | POA: Diagnosis not present

## 2021-05-31 DIAGNOSIS — M5137 Other intervertebral disc degeneration, lumbosacral region: Secondary | ICD-10-CM | POA: Diagnosis not present

## 2021-05-31 DIAGNOSIS — M48062 Spinal stenosis, lumbar region with neurogenic claudication: Secondary | ICD-10-CM | POA: Diagnosis not present

## 2021-06-19 DIAGNOSIS — L900 Lichen sclerosus et atrophicus: Secondary | ICD-10-CM | POA: Diagnosis not present

## 2021-06-29 DIAGNOSIS — Z6824 Body mass index (BMI) 24.0-24.9, adult: Secondary | ICD-10-CM | POA: Diagnosis not present

## 2021-06-29 DIAGNOSIS — L900 Lichen sclerosus et atrophicus: Secondary | ICD-10-CM | POA: Diagnosis not present

## 2021-06-29 DIAGNOSIS — N952 Postmenopausal atrophic vaginitis: Secondary | ICD-10-CM | POA: Diagnosis not present

## 2021-06-29 DIAGNOSIS — Z01419 Encounter for gynecological examination (general) (routine) without abnormal findings: Secondary | ICD-10-CM | POA: Diagnosis not present

## 2021-07-02 ENCOUNTER — Encounter: Payer: Self-pay | Admitting: Internal Medicine

## 2021-07-03 DIAGNOSIS — M9905 Segmental and somatic dysfunction of pelvic region: Secondary | ICD-10-CM | POA: Diagnosis not present

## 2021-07-03 DIAGNOSIS — M9903 Segmental and somatic dysfunction of lumbar region: Secondary | ICD-10-CM | POA: Diagnosis not present

## 2021-07-03 DIAGNOSIS — M9904 Segmental and somatic dysfunction of sacral region: Secondary | ICD-10-CM | POA: Diagnosis not present

## 2021-07-03 DIAGNOSIS — G8929 Other chronic pain: Secondary | ICD-10-CM | POA: Diagnosis not present

## 2021-07-03 DIAGNOSIS — M9902 Segmental and somatic dysfunction of thoracic region: Secondary | ICD-10-CM | POA: Diagnosis not present

## 2021-07-03 DIAGNOSIS — F419 Anxiety disorder, unspecified: Secondary | ICD-10-CM | POA: Diagnosis not present

## 2021-07-03 DIAGNOSIS — Z7189 Other specified counseling: Secondary | ICD-10-CM | POA: Diagnosis not present

## 2021-07-03 DIAGNOSIS — M1990 Unspecified osteoarthritis, unspecified site: Secondary | ICD-10-CM | POA: Diagnosis not present

## 2021-07-03 DIAGNOSIS — M9906 Segmental and somatic dysfunction of lower extremity: Secondary | ICD-10-CM | POA: Diagnosis not present

## 2021-07-03 DIAGNOSIS — M8580 Other specified disorders of bone density and structure, unspecified site: Secondary | ICD-10-CM | POA: Diagnosis not present

## 2021-07-03 DIAGNOSIS — E785 Hyperlipidemia, unspecified: Secondary | ICD-10-CM | POA: Diagnosis not present

## 2021-07-03 DIAGNOSIS — Z1331 Encounter for screening for depression: Secondary | ICD-10-CM | POA: Diagnosis not present

## 2021-07-03 DIAGNOSIS — N1831 Chronic kidney disease, stage 3a: Secondary | ICD-10-CM | POA: Diagnosis not present

## 2021-07-25 DIAGNOSIS — K648 Other hemorrhoids: Secondary | ICD-10-CM | POA: Diagnosis not present

## 2021-08-07 DIAGNOSIS — M5416 Radiculopathy, lumbar region: Secondary | ICD-10-CM | POA: Diagnosis not present

## 2021-08-23 DIAGNOSIS — Z23 Encounter for immunization: Secondary | ICD-10-CM | POA: Diagnosis not present

## 2021-08-29 DIAGNOSIS — M5136 Other intervertebral disc degeneration, lumbar region: Secondary | ICD-10-CM | POA: Diagnosis not present

## 2021-08-29 DIAGNOSIS — M48062 Spinal stenosis, lumbar region with neurogenic claudication: Secondary | ICD-10-CM | POA: Diagnosis not present

## 2021-08-29 DIAGNOSIS — M5137 Other intervertebral disc degeneration, lumbosacral region: Secondary | ICD-10-CM | POA: Diagnosis not present

## 2021-09-19 DIAGNOSIS — I8312 Varicose veins of left lower extremity with inflammation: Secondary | ICD-10-CM | POA: Diagnosis not present

## 2021-09-19 DIAGNOSIS — I8311 Varicose veins of right lower extremity with inflammation: Secondary | ICD-10-CM | POA: Diagnosis not present

## 2021-09-26 DIAGNOSIS — L738 Other specified follicular disorders: Secondary | ICD-10-CM | POA: Diagnosis not present

## 2021-09-26 DIAGNOSIS — L820 Inflamed seborrheic keratosis: Secondary | ICD-10-CM | POA: Diagnosis not present

## 2021-09-26 DIAGNOSIS — L720 Epidermal cyst: Secondary | ICD-10-CM | POA: Diagnosis not present

## 2021-09-26 DIAGNOSIS — Z85828 Personal history of other malignant neoplasm of skin: Secondary | ICD-10-CM | POA: Diagnosis not present

## 2021-09-26 DIAGNOSIS — L821 Other seborrheic keratosis: Secondary | ICD-10-CM | POA: Diagnosis not present

## 2021-09-26 DIAGNOSIS — Z961 Presence of intraocular lens: Secondary | ICD-10-CM | POA: Diagnosis not present

## 2021-09-26 DIAGNOSIS — H353131 Nonexudative age-related macular degeneration, bilateral, early dry stage: Secondary | ICD-10-CM | POA: Diagnosis not present

## 2021-09-26 DIAGNOSIS — D3131 Benign neoplasm of right choroid: Secondary | ICD-10-CM | POA: Diagnosis not present

## 2021-09-27 DIAGNOSIS — I8311 Varicose veins of right lower extremity with inflammation: Secondary | ICD-10-CM | POA: Diagnosis not present

## 2021-10-10 ENCOUNTER — Encounter (HOSPITAL_BASED_OUTPATIENT_CLINIC_OR_DEPARTMENT_OTHER): Payer: Self-pay | Admitting: Emergency Medicine

## 2021-10-10 ENCOUNTER — Emergency Department (HOSPITAL_BASED_OUTPATIENT_CLINIC_OR_DEPARTMENT_OTHER): Payer: Medicare Other

## 2021-10-10 ENCOUNTER — Other Ambulatory Visit: Payer: Self-pay

## 2021-10-10 ENCOUNTER — Emergency Department (HOSPITAL_BASED_OUTPATIENT_CLINIC_OR_DEPARTMENT_OTHER)
Admission: EM | Admit: 2021-10-10 | Discharge: 2021-10-10 | Disposition: A | Discharge status: Home / Self Care | Payer: Medicare Other | Attending: Student | Admitting: Student

## 2021-10-10 DIAGNOSIS — N183 Chronic kidney disease, stage 3 unspecified: Secondary | ICD-10-CM | POA: Insufficient documentation

## 2021-10-10 DIAGNOSIS — R140 Abdominal distension (gaseous): Secondary | ICD-10-CM | POA: Diagnosis not present

## 2021-10-10 DIAGNOSIS — K6289 Other specified diseases of anus and rectum: Secondary | ICD-10-CM | POA: Diagnosis not present

## 2021-10-10 DIAGNOSIS — Z87891 Personal history of nicotine dependence: Secondary | ICD-10-CM | POA: Insufficient documentation

## 2021-10-10 DIAGNOSIS — K649 Unspecified hemorrhoids: Secondary | ICD-10-CM | POA: Diagnosis not present

## 2021-10-10 DIAGNOSIS — K5641 Fecal impaction: Secondary | ICD-10-CM | POA: Diagnosis not present

## 2021-10-10 DIAGNOSIS — K5900 Constipation, unspecified: Secondary | ICD-10-CM | POA: Diagnosis not present

## 2021-10-10 MED ORDER — LACTULOSE 10 GM/15ML PO SOLN
30.0000 g | Freq: Once | ORAL | Status: AC
  Administered 2021-10-10: 30 g via ORAL
  Filled 2021-10-10: qty 60

## 2021-10-10 MED ORDER — LIDOCAINE VISCOUS HCL 2 % MT SOLN: 15.0000 mL | OROMUCOSAL | 0 refills | Status: DC | PRN

## 2021-10-10 MED ORDER — HYDROCORTISONE (PERIANAL) 2.5 % EX CREA: 1.0000 "application " | TOPICAL_CREAM | Freq: Two times a day (BID) | CUTANEOUS | 0 refills | Status: DC

## 2021-10-10 MED ORDER — LIDOCAINE VISCOUS HCL 2 % MT SOLN
15.0000 mL | Freq: Once | OROMUCOSAL | Status: AC
  Administered 2021-10-10: 15 mL via OROMUCOSAL
  Filled 2021-10-10: qty 15

## 2021-10-10 NOTE — ED Triage Notes (Signed)
Pt arrives to ED with c/o of hemorrhoids. Pt reports that today her hemorrhoids have become extremely swollen. She has tried sitz bath and ice packs without relief. Pt states that she has been unable to have a BM today due to the rectal pain. She has tried a suppository that has not been successful.

## 2021-10-10 NOTE — ED Provider Notes (Signed)
[facility] EMERGENCY DEPT Provider Note   CSN: [id number] Arrival date & time: 10/10/21  1506     History Chief Complaint  Patient presents with   Hemorrhoids    Shannon Bowman is a 87 y.o. female with PMH internal and external hemorrhoids, CKD 3, GERD, constipation who presents the emergency department for evaluation of rectal pain and hemorrhoids.  She denies any rectal bleeding.  She states that she is felt more constipated and is concerned that her hemorrhoids are swelling and causing a bowel blockage.  She denies vomiting, chest pain, shortness of breath, abdominal pain or other systemic symptoms.  She endorses abdominal bloating and has tried ice, sitz bath's and a suppository with no successful bowel movements.  HPI     Past Medical History:  Diagnosis Date   Arthritis    Chronic kidney disease    states Dr. Delfina Redwood told her CKD stage 3   GERD (gastroesophageal reflux disease)    History of hiatal hernia    Spinal stenosis     Patient Active Problem List   Diagnosis Date Noted   Pseudophakia, right eye 01/03/2021   Choroidal nevus of right eye 06/02/2020   Cataract, nuclear sclerotic, left eye 65/99/3570   Lichen sclerosus of female genitalia 01/13/2019    Past Surgical History:  Procedure Laterality Date   COLONOSCOPY     eyelid surgery     to improve drooping of eyelids   INGUINAL HERNIA REPAIR N/A 07/29/2015   Procedure: LAPAROSCOPIC INGUINAL HERNIA REPAIR WITH MESH;  Surgeon: Greer Pickerel, MD;  Location: [facility] OR;  Service: General;  Laterality: N/A;   MULTIPLE TOOTH EXTRACTIONS     TONSILLECTOMY     UPPER GI ENDOSCOPY       OB History   No obstetric history on file.     Family History  Problem Relation Age of Onset   Stroke Mother    Diabetes Mother    Stroke Father     Social History   Tobacco Use   Smoking status: Former    Packs/day: 0.10    Years: 3.00    Pack years: 0.30    Types: Cigarettes    Quit date: 12/11/1963     Years since quitting: 57.8   Smokeless tobacco: Never  Substance Use Topics   Alcohol use: Yes    Comment: 1/day (wine)   Drug use: No    Home Medications Prior to Admission medications   Medication Sig Start Date End Date Taking? Authorizing Provider  ALPRAZolam Duanne Moron) 0.25 MG tablet Take 0.25 mg by mouth as needed for anxiety.    [provider]  calcium carbonate (OS-CAL) 600 MG TABS Take 600 mg by mouth 2 (two) times daily with a meal.    [provider]  cetirizine (ZYRTEC) 10 MG tablet Take 10 mg by mouth daily as needed for allergies.     [provider]  estradiol (ESTRACE) 0.1 MG/GM vaginal cream Place 1 Applicatorful vaginally once a week.    [provider]  glucosamine-chondroitin 500-400 MG tablet Take 1 tablet by mouth 2 (two) times daily.    [provider]  KRILL OIL OMEGA-3 PO Take 1 capsule by mouth daily.    [provider]  meloxicam (MOBIC) 7.5 MG tablet Take 7.5 mg by mouth daily.    [provider]  oxyCODONE-acetaminophen (PERCOCET/ROXICET) 5-325 MG per tablet Take 1 tablet by mouth every 4 (four) hours as needed for severe pain. 07/29/15   [name],  [name], MD  Probiotic CAPS Take 2 capsules by mouth daily.    [provider]  Propylene Glycol (SYSTANE BALANCE OP) Apply 1 drop to eye at bedtime.    [provider]    Allergies    Phenylephrine and Penicillins  Review of Systems   Review of Systems  Constitutional:  Negative for chills and fever.  HENT:  Negative for ear pain and sore throat.   Eyes:  Negative for pain and visual disturbance.  Respiratory:  Negative for cough and shortness of breath.   Cardiovascular:  Negative for chest pain and palpitations.  Gastrointestinal:  Positive for abdominal distention and rectal pain. Negative for abdominal pain and vomiting.  Genitourinary:  Negative for dysuria and hematuria.  Musculoskeletal:  Negative for arthralgias and back pain.   Skin:  Negative for color change and rash.  Neurological:  Negative for seizures and syncope.  All other systems reviewed and are negative.  Physical Exam Updated Vital Signs BP (!) 158/76   Pulse 93   Temp 97.8 F (36.6 C) (Oral)   Resp 16   Ht 5\' 2"  (1.575 m)   Wt 56.7 kg   SpO2 95%   BMI 22.86 kg/m   Physical Exam Vitals and nursing note reviewed.  Constitutional:      General: She is not in acute distress.    Appearance: She is well-developed.  HENT:     Head: Normocephalic and atraumatic.  Eyes:     Conjunctiva/sclera: Conjunctivae normal.  Cardiovascular:     Rate and Rhythm: Normal rate and regular rhythm.     Heart sounds: No murmur heard. Pulmonary:     Effort: Pulmonary effort is normal. No respiratory distress.     Breath sounds: Normal breath sounds.  Abdominal:     Palpations: Abdomen is soft.     Tenderness: There is no abdominal tenderness.  Genitourinary:    Comments: 1 cm external hemorrhoid, nonthrombosed Musculoskeletal:     Cervical back: Neck supple.  Skin:    General: Skin is warm and dry.  Neurological:     Mental Status: She is alert.    ED Results / Procedures / Treatments   Labs (all labs ordered are listed, but only abnormal results are displayed) Labs Reviewed - No data to display  EKG None  Radiology DG Abd Portable 1 View  Result Date: 10/10/2021 CLINICAL DATA:  Symptomatic hemorrhoids, assess stool burden EXAM: PORTABLE ABDOMEN - 1 VIEW COMPARISON:  None FINDINGS: Slightly prominent stool at the rectum and hepatic flexure. Remainder of colon free from significant retained stool. Nonobstructive bowel gas pattern. Bones demineralized with degenerative changes of lumbar spine. Atherosclerotic calcifications aorta and iliac arteries. IMPRESSION: Slightly increased stool at hepatic flexure of colon and at rectum. Electronically Signed   By: Lavonia Dana M.D.   On: 10/10/2021 18:45    Procedures Procedures   Medications Ordered in  ED Medications  lactulose (CHRONULAC) 10 GM/15ML solution 30 g (has no administration in time range)  lidocaine (XYLOCAINE) 2 % viscous mouth solution 15 mL (15 mLs Mouth/Throat Given 10/10/21 1838)    ED Course  I have reviewed the triage vital signs and the nursing notes.  Pertinent labs & imaging results that were available during my care of the patient were reviewed by me and considered in my medical decision making (see chart for details).    MDM Rules/Calculators/A&P  Patient seen the emergency department for evaluation of rectal pain, abdominal bloating and constipation with hemorrhoids.  Physical exam reveals a nontender abdomen but does show a 1 cm nonthrombosed external hemorrhoid that is tender to palpation.  KUB showed some mild stool burden at the hepatic flexure but no evidence of bowel obstruction.  Patient was given lactulose as a stool softener had encouraged to take MiraLAX at home.  Anusol was sent to the patient's pharmacy and she was instructed to follow-up with the previous provider who banded her internal hemorrhoids.  Patient given strict return precautions which she voiced understanding she was discharged. Final Clinical Impression(s) / ED Diagnoses Final diagnoses:  None    Rx / DC Orders ED Discharge Orders     None        Aleiyah Halpin, Debe Coder, MD 10/10/21 [phone]

## 2021-10-10 NOTE — ED Notes (Signed)
Pt c/o hemorrhoids (states she has had them all of her life) got worse today and she is constipated.

## 2021-10-17 DIAGNOSIS — I83892 Varicose veins of left lower extremities with other complications: Secondary | ICD-10-CM | POA: Diagnosis not present

## 2021-10-17 DIAGNOSIS — I87302 Chronic venous hypertension (idiopathic) without complications of left lower extremity: Secondary | ICD-10-CM | POA: Diagnosis not present

## 2021-10-17 DIAGNOSIS — M7989 Other specified soft tissue disorders: Secondary | ICD-10-CM | POA: Diagnosis not present

## 2021-10-17 DIAGNOSIS — I83891 Varicose veins of right lower extremities with other complications: Secondary | ICD-10-CM | POA: Diagnosis not present

## 2021-10-17 DIAGNOSIS — I872 Venous insufficiency (chronic) (peripheral): Secondary | ICD-10-CM | POA: Diagnosis not present

## 2021-11-09 DIAGNOSIS — M48062 Spinal stenosis, lumbar region with neurogenic claudication: Secondary | ICD-10-CM | POA: Diagnosis not present

## 2021-11-15 DIAGNOSIS — K649 Unspecified hemorrhoids: Secondary | ICD-10-CM | POA: Diagnosis not present

## 2021-11-15 DIAGNOSIS — N1831 Chronic kidney disease, stage 3a: Secondary | ICD-10-CM | POA: Diagnosis not present

## 2021-11-15 DIAGNOSIS — F419 Anxiety disorder, unspecified: Secondary | ICD-10-CM | POA: Diagnosis not present

## 2021-11-15 DIAGNOSIS — Z1331 Encounter for screening for depression: Secondary | ICD-10-CM | POA: Diagnosis not present

## 2021-11-15 DIAGNOSIS — G8929 Other chronic pain: Secondary | ICD-10-CM | POA: Diagnosis not present

## 2021-11-15 DIAGNOSIS — E785 Hyperlipidemia, unspecified: Secondary | ICD-10-CM | POA: Diagnosis not present

## 2021-11-15 DIAGNOSIS — Z23 Encounter for immunization: Secondary | ICD-10-CM | POA: Diagnosis not present

## 2021-11-15 DIAGNOSIS — M8580 Other specified disorders of bone density and structure, unspecified site: Secondary | ICD-10-CM | POA: Diagnosis not present

## 2021-11-15 DIAGNOSIS — R110 Nausea: Secondary | ICD-10-CM | POA: Diagnosis not present

## 2021-11-15 DIAGNOSIS — Z7189 Other specified counseling: Secondary | ICD-10-CM | POA: Diagnosis not present

## 2021-11-15 DIAGNOSIS — K5900 Constipation, unspecified: Secondary | ICD-10-CM | POA: Diagnosis not present

## 2021-11-15 DIAGNOSIS — M1990 Unspecified osteoarthritis, unspecified site: Secondary | ICD-10-CM | POA: Diagnosis not present

## 2021-11-15 DIAGNOSIS — Z1339 Encounter for screening examination for other mental health and behavioral disorders: Secondary | ICD-10-CM | POA: Diagnosis not present

## 2021-12-06 DIAGNOSIS — M5440 Lumbago with sciatica, unspecified side: Secondary | ICD-10-CM | POA: Diagnosis not present

## 2021-12-06 DIAGNOSIS — M6281 Muscle weakness (generalized): Secondary | ICD-10-CM | POA: Diagnosis not present

## 2021-12-06 DIAGNOSIS — M4807 Spinal stenosis, lumbosacral region: Secondary | ICD-10-CM | POA: Diagnosis not present

## 2021-12-06 DIAGNOSIS — R262 Difficulty in walking, not elsewhere classified: Secondary | ICD-10-CM | POA: Diagnosis not present

## 2021-12-10 DIAGNOSIS — G459 Transient cerebral ischemic attack, unspecified: Secondary | ICD-10-CM

## 2021-12-10 HISTORY — DX: Transient cerebral ischemic attack, unspecified: G45.9

## 2021-12-12 DIAGNOSIS — M5440 Lumbago with sciatica, unspecified side: Secondary | ICD-10-CM | POA: Diagnosis not present

## 2021-12-12 DIAGNOSIS — R262 Difficulty in walking, not elsewhere classified: Secondary | ICD-10-CM | POA: Diagnosis not present

## 2021-12-12 DIAGNOSIS — M4807 Spinal stenosis, lumbosacral region: Secondary | ICD-10-CM | POA: Diagnosis not present

## 2021-12-12 DIAGNOSIS — M6281 Muscle weakness (generalized): Secondary | ICD-10-CM | POA: Diagnosis not present

## 2021-12-14 DIAGNOSIS — M5440 Lumbago with sciatica, unspecified side: Secondary | ICD-10-CM | POA: Diagnosis not present

## 2021-12-14 DIAGNOSIS — M5416 Radiculopathy, lumbar region: Secondary | ICD-10-CM | POA: Diagnosis not present

## 2021-12-14 DIAGNOSIS — I10 Essential (primary) hypertension: Secondary | ICD-10-CM | POA: Diagnosis not present

## 2021-12-14 DIAGNOSIS — M6281 Muscle weakness (generalized): Secondary | ICD-10-CM | POA: Diagnosis not present

## 2021-12-14 DIAGNOSIS — M4807 Spinal stenosis, lumbosacral region: Secondary | ICD-10-CM | POA: Diagnosis not present

## 2021-12-14 DIAGNOSIS — M48062 Spinal stenosis, lumbar region with neurogenic claudication: Secondary | ICD-10-CM | POA: Diagnosis not present

## 2021-12-14 DIAGNOSIS — R262 Difficulty in walking, not elsewhere classified: Secondary | ICD-10-CM | POA: Diagnosis not present

## 2021-12-19 DIAGNOSIS — M5440 Lumbago with sciatica, unspecified side: Secondary | ICD-10-CM | POA: Diagnosis not present

## 2021-12-19 DIAGNOSIS — M6281 Muscle weakness (generalized): Secondary | ICD-10-CM | POA: Diagnosis not present

## 2021-12-19 DIAGNOSIS — R262 Difficulty in walking, not elsewhere classified: Secondary | ICD-10-CM | POA: Diagnosis not present

## 2021-12-19 DIAGNOSIS — M4807 Spinal stenosis, lumbosacral region: Secondary | ICD-10-CM | POA: Diagnosis not present

## 2021-12-22 DIAGNOSIS — M6281 Muscle weakness (generalized): Secondary | ICD-10-CM | POA: Diagnosis not present

## 2021-12-22 DIAGNOSIS — M5440 Lumbago with sciatica, unspecified side: Secondary | ICD-10-CM | POA: Diagnosis not present

## 2021-12-22 DIAGNOSIS — R262 Difficulty in walking, not elsewhere classified: Secondary | ICD-10-CM | POA: Diagnosis not present

## 2021-12-22 DIAGNOSIS — M4807 Spinal stenosis, lumbosacral region: Secondary | ICD-10-CM | POA: Diagnosis not present

## 2021-12-26 DIAGNOSIS — M4807 Spinal stenosis, lumbosacral region: Secondary | ICD-10-CM | POA: Diagnosis not present

## 2021-12-26 DIAGNOSIS — M6281 Muscle weakness (generalized): Secondary | ICD-10-CM | POA: Diagnosis not present

## 2021-12-26 DIAGNOSIS — M5440 Lumbago with sciatica, unspecified side: Secondary | ICD-10-CM | POA: Diagnosis not present

## 2021-12-26 DIAGNOSIS — R262 Difficulty in walking, not elsewhere classified: Secondary | ICD-10-CM | POA: Diagnosis not present

## 2021-12-28 DIAGNOSIS — R262 Difficulty in walking, not elsewhere classified: Secondary | ICD-10-CM | POA: Diagnosis not present

## 2021-12-28 DIAGNOSIS — M4807 Spinal stenosis, lumbosacral region: Secondary | ICD-10-CM | POA: Diagnosis not present

## 2021-12-28 DIAGNOSIS — M5440 Lumbago with sciatica, unspecified side: Secondary | ICD-10-CM | POA: Diagnosis not present

## 2021-12-28 DIAGNOSIS — M6281 Muscle weakness (generalized): Secondary | ICD-10-CM | POA: Diagnosis not present

## 2022-01-02 DIAGNOSIS — M6281 Muscle weakness (generalized): Secondary | ICD-10-CM | POA: Diagnosis not present

## 2022-01-02 DIAGNOSIS — M4807 Spinal stenosis, lumbosacral region: Secondary | ICD-10-CM | POA: Diagnosis not present

## 2022-01-02 DIAGNOSIS — R262 Difficulty in walking, not elsewhere classified: Secondary | ICD-10-CM | POA: Diagnosis not present

## 2022-01-02 DIAGNOSIS — M5440 Lumbago with sciatica, unspecified side: Secondary | ICD-10-CM | POA: Diagnosis not present

## 2022-01-04 ENCOUNTER — Ambulatory Visit (INDEPENDENT_AMBULATORY_CARE_PROVIDER_SITE_OTHER): Payer: Medicare Other | Admitting: Ophthalmology

## 2022-01-04 ENCOUNTER — Encounter (INDEPENDENT_AMBULATORY_CARE_PROVIDER_SITE_OTHER): Payer: Self-pay | Admitting: Ophthalmology

## 2022-01-04 ENCOUNTER — Other Ambulatory Visit: Payer: Self-pay

## 2022-01-04 DIAGNOSIS — D3131 Benign neoplasm of right choroid: Secondary | ICD-10-CM

## 2022-01-04 DIAGNOSIS — Z961 Presence of intraocular lens: Secondary | ICD-10-CM

## 2022-01-04 DIAGNOSIS — H43813 Vitreous degeneration, bilateral: Secondary | ICD-10-CM

## 2022-01-04 NOTE — Assessment & Plan Note (Signed)
OD, choroidal nevus nasal mid periphery, stable over time no interval change

## 2022-01-04 NOTE — Progress Notes (Signed)
01/04/2022     CHIEF COMPLAINT Patient presents for  Chief Complaint  Patient presents with   Retina Follow Up      HISTORY OF PRESENT ILLNESS: Shannon Bowman is a 88 y.o. female who presents to the clinic today for:   HPI     Retina Follow Up           Diagnosis: Other   Laterality: right eye   Onset: 1 year ago   Severity: mild   Duration: 1 year   Course: stable         Comments   1 yr fu OU oct fp Patient states vision is stable and unchanged since last visit. Denies any new floaters or FOL. Pt has had cataract in the last year, knows the right eye was done 02/2021 by Dr. Katy Fitch.      Last edited by Laurin Coder on 01/04/2022  1:07 PM.      Referring physician: Debbra Riding, MD [address] [place],  [place] [address]  HISTORICAL INFORMATION:   Selected notes from the MEDICAL RECORD NUMBER       CURRENT MEDICATIONS: Current Outpatient Medications (Ophthalmic Drugs)  Medication Sig   Propylene Glycol (SYSTANE BALANCE OP) Apply 1 drop to eye at bedtime.   No current facility-administered medications for this visit. (Ophthalmic Drugs)   Current Outpatient Medications (Other)  Medication Sig   ALPRAZolam (XANAX) 0.25 MG tablet Take 0.25 mg by mouth as needed for anxiety.   calcium carbonate (OS-CAL) 600 MG TABS Take 600 mg by mouth 2 (two) times daily with a meal.   cetirizine (ZYRTEC) 10 MG tablet Take 10 mg by mouth daily as needed for allergies.    estradiol (ESTRACE) 0.1 MG/GM vaginal cream Place 1 Applicatorful vaginally once a week.   glucosamine-chondroitin 500-400 MG tablet Take 1 tablet by mouth 2 (two) times daily.   hydrocortisone (ANUSOL-HC) 2.5 % rectal cream Place 1 application rectally 2 (two) times daily.   KRILL OIL OMEGA-3 PO Take 1 capsule by mouth daily.   lidocaine (XYLOCAINE) 2 % solution Use as directed 15 mLs in the mouth or throat as needed for mouth pain.   meloxicam (MOBIC) 7.5 MG tablet Take 7.5 mg by  mouth daily.   oxyCODONE-acetaminophen (PERCOCET/ROXICET) 5-325 MG per tablet Take 1 tablet by mouth every 4 (four) hours as needed for severe pain.   Probiotic CAPS Take 2 capsules by mouth daily.   No current facility-administered medications for this visit. (Other)      REVIEW OF SYSTEMS: ROS   Negative for: Constitutional, Gastrointestinal, Neurological, Skin, Genitourinary, Musculoskeletal, HENT, Endocrine, Cardiovascular, Eyes, Respiratory, Psychiatric, Allergic/Imm, Heme/Lymph Last edited by Hurman Horn, MD on 01/04/2022  1:38 PM.       ALLERGIES Allergies  Allergen Reactions   Phenylephrine Other (See Comments)    Possible allergy to dilating drop phenylephrine 2.5% - USE Tropicamide 1% only to dilate Possible allergy to dilating drop phenylephrine 2.5% - USE Tropicamide 1% only to dilate    Penicillins Rash    PAST MEDICAL HISTORY Past Medical History:  Diagnosis Date   Arthritis    Cataract, nuclear sclerotic, left eye 06/02/2020   The nature of cataract was discussed with the patient as well as the elective nature of surgery. The patient was reassured that surgery at a later date does not put the patient at risk for a worse outcome. It was emphasized that the need for surgery is dictated  by the patient's quality of life as influenced by the cataract. Patient was instructed to maintain close follow up with their general eye    Chronic kidney disease    states Dr. Delfina Redwood told her CKD stage 3   GERD (gastroesophageal reflux disease)    History of hiatal hernia    Spinal stenosis    Past Surgical History:  Procedure Laterality Date   COLONOSCOPY     eyelid surgery     to improve drooping of eyelids   INGUINAL HERNIA REPAIR N/A 07/29/2015   Procedure: LAPAROSCOPIC INGUINAL HERNIA REPAIR WITH MESH;  Surgeon: Greer Pickerel, MD;  Location: [facility] OR;  Service: General;  Laterality: N/A;   MULTIPLE TOOTH EXTRACTIONS     TONSILLECTOMY     UPPER GI ENDOSCOPY      FAMILY  HISTORY Family History  Problem Relation Age of Onset   Stroke Mother    Diabetes Mother    Stroke Father     SOCIAL HISTORY Social History   Tobacco Use   Smoking status: Former    Packs/day: 0.10    Years: 3.00    Pack years: 0.30    Types: Cigarettes    Quit date: 12/11/1963    Years since quitting: 58.1   Smokeless tobacco: Never  Substance Use Topics   Alcohol use: Yes    Comment: 1/day (wine)   Drug use: No         OPHTHALMIC EXAM:  Base Eye Exam     Visual Acuity (ETDRS)       Right Left   Dist [place] 20/30 -1 20/25 -1         Tonometry (Tonopen, 1:08 PM)       Right Left   Pressure 17 19         Pupils       Pupils APD   Right PERRL None   Left PERRL None         Extraocular Movement       Right Left    Full Full         Neuro/Psych     Oriented x3: Yes   Mood/Affect: Normal         Dilation     Both eyes: 1.0% Mydriacyl, 2.5% Phenylephrine @ 1:08 PM           Slit Lamp and Fundus Exam     Slit Lamp Exam       Right Left   Lids/Lashes Normal Normal   Conjunctiva/Sclera White and quiet White and quiet   Cornea Clear Clear   Anterior Chamber Deep and quiet Deep and quiet   Iris Round and reactive Round and reactive   Lens Centered posterior chamber intraocular lens Nuclear sclerosis 3+   Anterior Vitreous Normal Normal         Fundus Exam       Right Left   Posterior Vitreous Posterior vitreous detachment Posterior vitreous detachment   Disc Normal Normal   C/D Ratio 0.2 0.2   Macula Early age related macular degeneration, Hard drusen, no macular thickening, no exudates Early age related macular degeneration, Hard drusen, no macular thickening, no exudates   Vessels Normal Normal   Periphery Choroidal  nevus nasal to the optic nerve 3X2 DD size appears flat, no lipofuscin, no atrophy, no subretinal fluid Normal            IMAGING AND PROCEDURES  Imaging and Procedures for 01/04/22  OCT, Retina - OU -  Both Eyes       Right Eye Quality was good. Scan locations included subfoveal. Central Foveal Thickness: 247. Progression has no prior data. Findings include normal foveal contour.   Left Eye Quality was good. Scan locations included subfoveal. Central Foveal Thickness: 257. Progression has no prior data. Findings include normal foveal contour.   Notes OU incidental PVD     Color Fundus Photography Optos - OU - Both Eyes       Right Eye Progression has been stable. Disc findings include normal observations. Macula : drusen. Vessels : normal observations. Periphery : normal observations.   Left Eye Progression has no prior data. Disc findings include normal observations. Macula : drusen. Periphery : normal observations.   Notes Clear media OD, stable nevus change OD, nasal mid periphery  Early nevus nasally  Dense yellow filter left eye secondary to nuclear sclerotic cataract still on treated             ASSESSMENT/PLAN:  Pseudophakia, left eye Stable OU  Choroidal nevus of right eye OD, choroidal nevus nasal mid periphery, stable over time no interval change  Posterior vitreous detachment of both eyes Physiologic OU no holes or tears     ICD-10-CM   1. Choroidal nevus of right eye  D31.31 OCT, Retina - OU - Both Eyes    Color Fundus Photography Optos - OU - Both Eyes    2. Pseudophakia, left eye  Z96.1     3. Posterior vitreous detachment of both eyes  H43.813       1.  Choroidal nevus OD, no interval change year-over-year, stable finding since June 2021.  2.  There is illogic PVD OU  3.  Ophthalmic Meds Ordered this visit:  No orders of the defined types were placed in this encounter.      Return in about 1 year (around 01/04/2023) for COLOR FP, DILATE OU, OCT.  There are no Patient Instructions on file for this visit.   Explained the diagnoses, plan, and follow up with the patient and they expressed understanding.  Patient expressed  understanding of the importance of proper follow up care.   Clent Demark Ebenezer Mccaskey M.D. Diseases & Surgery of the Retina and Vitreous [facility] [place] 01/04/22     Abbreviations: M myopia (nearsighted); A astigmatism; H hyperopia (farsighted); P presbyopia; Mrx spectacle prescription;  CTL contact lenses; OD right eye; OS left eye; OU both eyes  XT exotropia; ET esotropia; PEK punctate epithelial keratitis; PEE punctate epithelial erosions; DES dry eye syndrome; MGD meibomian gland dysfunction; ATs artificial tears; PFAT's preservative free artificial tears; [place] nuclear sclerotic cataract; PSC posterior subcapsular cataract; ERM epi-retinal membrane; PVD posterior vitreous detachment; RD retinal detachment; DM diabetes mellitus; DR diabetic retinopathy; NPDR non-proliferative diabetic retinopathy; PDR proliferative diabetic retinopathy; CSME clinically significant macular edema; DME diabetic macular edema; dbh dot blot hemorrhages; CWS cotton wool spot; POAG primary open angle glaucoma; C/D cup-to-disc ratio; HVF humphrey visual field; GVF goldmann visual field; OCT optical coherence tomography; IOP intraocular pressure; BRVO Branch retinal vein occlusion; CRVO central retinal vein occlusion; CRAO central retinal artery occlusion; BRAO branch retinal artery occlusion; RT retinal tear; SB scleral buckle; PPV pars plana vitrectomy; VH Vitreous hemorrhage; PRP panretinal laser photocoagulation; IVK intravitreal kenalog; VMT vitreomacular traction; MH Macular hole;  NVD neovascularization of the disc; NVE neovascularization elsewhere; AREDS age related eye disease study; ARMD age related macular degeneration; POAG primary open angle glaucoma; EBMD epithelial/anterior basement membrane dystrophy; ACIOL anterior chamber intraocular lens;  IOL intraocular lens; PCIOL posterior chamber intraocular lens; Phaco/IOL phacoemulsification with intraocular lens placement; [place] photorefractive keratectomy; LASIK laser  assisted in situ keratomileusis; HTN hypertension; DM diabetes mellitus; COPD chronic obstructive pulmonary disease

## 2022-01-04 NOTE — Assessment & Plan Note (Addendum)
Physiologic OU no holes or tears 

## 2022-01-04 NOTE — Assessment & Plan Note (Signed)
Stable OU 

## 2022-01-10 DIAGNOSIS — M4807 Spinal stenosis, lumbosacral region: Secondary | ICD-10-CM | POA: Diagnosis not present

## 2022-01-10 DIAGNOSIS — R262 Difficulty in walking, not elsewhere classified: Secondary | ICD-10-CM | POA: Diagnosis not present

## 2022-01-10 DIAGNOSIS — M6281 Muscle weakness (generalized): Secondary | ICD-10-CM | POA: Diagnosis not present

## 2022-01-10 DIAGNOSIS — M5440 Lumbago with sciatica, unspecified side: Secondary | ICD-10-CM | POA: Diagnosis not present

## 2022-02-01 DIAGNOSIS — K648 Other hemorrhoids: Secondary | ICD-10-CM | POA: Diagnosis not present

## 2022-02-12 DIAGNOSIS — M5416 Radiculopathy, lumbar region: Secondary | ICD-10-CM | POA: Diagnosis not present

## 2022-03-13 DIAGNOSIS — M48062 Spinal stenosis, lumbar region with neurogenic claudication: Secondary | ICD-10-CM | POA: Diagnosis not present

## 2022-04-12 DIAGNOSIS — R7989 Other specified abnormal findings of blood chemistry: Secondary | ICD-10-CM | POA: Diagnosis not present

## 2022-04-12 DIAGNOSIS — N1831 Chronic kidney disease, stage 3a: Secondary | ICD-10-CM | POA: Diagnosis not present

## 2022-04-12 DIAGNOSIS — M859 Disorder of bone density and structure, unspecified: Secondary | ICD-10-CM | POA: Diagnosis not present

## 2022-04-12 DIAGNOSIS — E785 Hyperlipidemia, unspecified: Secondary | ICD-10-CM | POA: Diagnosis not present

## 2022-04-19 DIAGNOSIS — Z0000 Encounter for general adult medical examination without abnormal findings: Secondary | ICD-10-CM | POA: Diagnosis not present

## 2022-04-19 DIAGNOSIS — K649 Unspecified hemorrhoids: Secondary | ICD-10-CM | POA: Diagnosis not present

## 2022-04-19 DIAGNOSIS — F419 Anxiety disorder, unspecified: Secondary | ICD-10-CM | POA: Diagnosis not present

## 2022-04-19 DIAGNOSIS — M8580 Other specified disorders of bone density and structure, unspecified site: Secondary | ICD-10-CM | POA: Diagnosis not present

## 2022-04-19 DIAGNOSIS — D649 Anemia, unspecified: Secondary | ICD-10-CM | POA: Diagnosis not present

## 2022-04-19 DIAGNOSIS — E785 Hyperlipidemia, unspecified: Secondary | ICD-10-CM | POA: Diagnosis not present

## 2022-04-19 DIAGNOSIS — K5900 Constipation, unspecified: Secondary | ICD-10-CM | POA: Diagnosis not present

## 2022-04-19 DIAGNOSIS — R110 Nausea: Secondary | ICD-10-CM | POA: Diagnosis not present

## 2022-04-19 DIAGNOSIS — M1990 Unspecified osteoarthritis, unspecified site: Secondary | ICD-10-CM | POA: Diagnosis not present

## 2022-04-19 DIAGNOSIS — G8929 Other chronic pain: Secondary | ICD-10-CM | POA: Diagnosis not present

## 2022-04-19 DIAGNOSIS — Z1331 Encounter for screening for depression: Secondary | ICD-10-CM | POA: Diagnosis not present

## 2022-04-19 DIAGNOSIS — N1831 Chronic kidney disease, stage 3a: Secondary | ICD-10-CM | POA: Diagnosis not present

## 2022-05-16 DIAGNOSIS — E785 Hyperlipidemia, unspecified: Secondary | ICD-10-CM | POA: Diagnosis not present

## 2022-05-16 DIAGNOSIS — K5900 Constipation, unspecified: Secondary | ICD-10-CM | POA: Diagnosis not present

## 2022-05-16 DIAGNOSIS — M1990 Unspecified osteoarthritis, unspecified site: Secondary | ICD-10-CM | POA: Diagnosis not present

## 2022-05-16 DIAGNOSIS — J069 Acute upper respiratory infection, unspecified: Secondary | ICD-10-CM | POA: Diagnosis not present

## 2022-05-16 DIAGNOSIS — D649 Anemia, unspecified: Secondary | ICD-10-CM | POA: Diagnosis not present

## 2022-05-16 DIAGNOSIS — G8929 Other chronic pain: Secondary | ICD-10-CM | POA: Diagnosis not present

## 2022-05-16 DIAGNOSIS — F419 Anxiety disorder, unspecified: Secondary | ICD-10-CM | POA: Diagnosis not present

## 2022-05-16 DIAGNOSIS — M8580 Other specified disorders of bone density and structure, unspecified site: Secondary | ICD-10-CM | POA: Diagnosis not present

## 2022-05-16 DIAGNOSIS — N1831 Chronic kidney disease, stage 3a: Secondary | ICD-10-CM | POA: Diagnosis not present

## 2022-05-16 DIAGNOSIS — R110 Nausea: Secondary | ICD-10-CM | POA: Diagnosis not present

## 2022-07-12 DIAGNOSIS — M48062 Spinal stenosis, lumbar region with neurogenic claudication: Secondary | ICD-10-CM | POA: Diagnosis not present

## 2022-07-20 ENCOUNTER — Ambulatory Visit (HOSPITAL_COMMUNITY)
Admission: RE | Admit: 2022-07-20 | Discharge: 2022-07-20 | Disposition: A | Discharge status: Home / Self Care | Payer: Medicare Other | Source: Ambulatory Visit | Attending: Internal Medicine | Admitting: Internal Medicine

## 2022-07-20 ENCOUNTER — Other Ambulatory Visit (HOSPITAL_BASED_OUTPATIENT_CLINIC_OR_DEPARTMENT_OTHER): Payer: Self-pay | Admitting: Internal Medicine

## 2022-07-20 ENCOUNTER — Other Ambulatory Visit: Payer: Self-pay | Admitting: Internal Medicine

## 2022-07-20 ENCOUNTER — Other Ambulatory Visit (HOSPITAL_COMMUNITY): Payer: Self-pay | Admitting: Internal Medicine

## 2022-07-20 DIAGNOSIS — E785 Hyperlipidemia, unspecified: Secondary | ICD-10-CM | POA: Diagnosis not present

## 2022-07-20 DIAGNOSIS — N1831 Chronic kidney disease, stage 3a: Secondary | ICD-10-CM | POA: Diagnosis not present

## 2022-07-20 DIAGNOSIS — K5900 Constipation, unspecified: Secondary | ICD-10-CM | POA: Diagnosis not present

## 2022-07-20 DIAGNOSIS — G8929 Other chronic pain: Secondary | ICD-10-CM | POA: Diagnosis not present

## 2022-07-20 DIAGNOSIS — I739 Peripheral vascular disease, unspecified: Secondary | ICD-10-CM | POA: Diagnosis not present

## 2022-07-20 DIAGNOSIS — G459 Transient cerebral ischemic attack, unspecified: Secondary | ICD-10-CM

## 2022-07-20 DIAGNOSIS — M8580 Other specified disorders of bone density and structure, unspecified site: Secondary | ICD-10-CM | POA: Diagnosis not present

## 2022-07-20 DIAGNOSIS — R110 Nausea: Secondary | ICD-10-CM | POA: Diagnosis not present

## 2022-07-20 DIAGNOSIS — I6523 Occlusion and stenosis of bilateral carotid arteries: Secondary | ICD-10-CM | POA: Diagnosis not present

## 2022-07-20 DIAGNOSIS — F419 Anxiety disorder, unspecified: Secondary | ICD-10-CM | POA: Diagnosis not present

## 2022-07-20 DIAGNOSIS — D649 Anemia, unspecified: Secondary | ICD-10-CM | POA: Diagnosis not present

## 2022-07-20 DIAGNOSIS — M1990 Unspecified osteoarthritis, unspecified site: Secondary | ICD-10-CM | POA: Diagnosis not present

## 2022-07-20 DIAGNOSIS — N181 Chronic kidney disease, stage 1: Secondary | ICD-10-CM | POA: Diagnosis not present

## 2022-07-20 DIAGNOSIS — I672 Cerebral atherosclerosis: Secondary | ICD-10-CM | POA: Diagnosis not present

## 2022-07-20 DIAGNOSIS — R7309 Other abnormal glucose: Secondary | ICD-10-CM | POA: Diagnosis not present

## 2022-07-20 DIAGNOSIS — I6622 Occlusion and stenosis of left posterior cerebral artery: Secondary | ICD-10-CM | POA: Diagnosis not present

## 2022-07-20 LAB — POCT I-STAT CREATININE: Creatinine, Ser: 1.1 mg/dL — ABNORMAL HIGH (ref 0.44–1.00)

## 2022-07-20 MED ORDER — IOHEXOL 350 MG/ML SOLN
75.0000 mL | Freq: Once | INTRAVENOUS | Status: AC | PRN
  Administered 2022-07-20: 75 mL via INTRAVENOUS

## 2022-07-24 ENCOUNTER — Other Ambulatory Visit (HOSPITAL_COMMUNITY): Payer: Self-pay | Admitting: Internal Medicine

## 2022-07-24 ENCOUNTER — Ambulatory Visit (HOSPITAL_COMMUNITY): Payer: Medicare Other | Attending: Cardiovascular Disease

## 2022-07-24 DIAGNOSIS — G459 Transient cerebral ischemic attack, unspecified: Secondary | ICD-10-CM

## 2022-07-24 LAB — ECHOCARDIOGRAM COMPLETE
Area-P 1/2: 2.14 cm2
P 1/2 time: 378 msec
S' Lateral: 2.4 cm

## 2022-07-24 MED ORDER — PERFLUTREN LIPID MICROSPHERE
2.0000 mL | INTRAVENOUS | Status: AC | PRN
  Administered 2022-07-24: 2 mL via INTRAVENOUS

## 2022-07-25 ENCOUNTER — Encounter: Payer: Self-pay | Admitting: Internal Medicine

## 2022-07-25 ENCOUNTER — Ambulatory Visit: Payer: Medicare Other | Admitting: Internal Medicine

## 2022-07-25 VITALS — BP 180/79 | HR 74 | Temp 98.7°F | Resp 16 | Ht 62.0 in | Wt 123.0 lb

## 2022-07-25 DIAGNOSIS — I10 Essential (primary) hypertension: Secondary | ICD-10-CM

## 2022-07-25 DIAGNOSIS — E785 Hyperlipidemia, unspecified: Secondary | ICD-10-CM | POA: Diagnosis not present

## 2022-07-25 DIAGNOSIS — G459 Transient cerebral ischemic attack, unspecified: Secondary | ICD-10-CM | POA: Diagnosis not present

## 2022-07-25 MED ORDER — ROSUVASTATIN CALCIUM 40 MG PO TABS: 40.0000 mg | ORAL_TABLET | Freq: Every day | ORAL | 2 refills | Status: DC

## 2022-07-25 MED ORDER — LOSARTAN POTASSIUM-HCTZ 50-12.5 MG PO TABS: 1.0000 | ORAL_TABLET | Freq: Every morning | ORAL | 2 refills | Status: DC

## 2022-07-25 MED ORDER — CLOPIDOGREL BISULFATE 75 MG PO TABS: 75.0000 mg | ORAL_TABLET | Freq: Every day | ORAL | 0 refills | Status: DC

## 2022-07-25 MED ORDER — AMLODIPINE BESYLATE 5 MG PO TABS: 5.0000 mg | ORAL_TABLET | Freq: Every day | ORAL | 2 refills | Status: DC

## 2022-07-25 NOTE — Progress Notes (Signed)
Primary Physician/Referring:  Sueanne Margarita, DO  Patient ID: Shannon Bowman, female    DOB: February 13, 1932, 88 y.o.   MRN: [id number]  Chief Complaint  Patient presents with   Referral   New Patient (Initial Visit)   HPI:    Shannon Bowman  is a 88 y.o. female with past medical history significant for CKD, hypertension, and hyperlipidemia who is here to establish care with cardiology.  Patient recently had a TIA where her beach was affected and lasted about 1 hour.  Patient has never had anything like this before.  She did had a extensive brain imaging the hospital which did not show a stroke and the patient was diagnosed with TIA.  She has had uncontrolled blood pressure for a while now.  She does not smoke or drink alcohol.  She does not have any cardiac history that she knows of.  Patient agreeable to starting these medications and following up after testing is complete.  Past Medical History:  Diagnosis Date   Arthritis    Cataract, nuclear sclerotic, left eye 06/02/2020   The nature of cataract was discussed with the patient as well as the elective nature of surgery. The patient was reassured that surgery at a later date does not put the patient at risk for a worse outcome. It was emphasized that the need for surgery is dictated by the patient's quality of life as influenced by the cataract. Patient was instructed to maintain close follow up with their general eye    Chronic kidney disease    states Dr. Delfina Redwood told her CKD stage 3   GERD (gastroesophageal reflux disease)    History of hiatal hernia    Spinal stenosis    Past Surgical History:  Procedure Laterality Date   COLONOSCOPY     eyelid surgery     to improve drooping of eyelids   INGUINAL HERNIA REPAIR N/A 07/29/2015   Procedure: LAPAROSCOPIC INGUINAL HERNIA REPAIR WITH MESH;  Surgeon: Greer Pickerel, MD;  Location: [facility] OR;  Service: General;  Laterality: N/A;   MULTIPLE TOOTH EXTRACTIONS     TONSILLECTOMY     UPPER GI  ENDOSCOPY     Family History  Problem Relation Age of Onset   Stroke Mother    Diabetes Mother    Stroke Father     Social History   Tobacco Use   Smoking status: Former    Packs/day: 0.10    Years: 3.00    Total pack years: 0.30    Types: Cigarettes    Quit date: 12/11/1963    Years since quitting: 58.6   Smokeless tobacco: Never  Substance Use Topics   Alcohol use: Yes    Comment: 1/day (wine)   Marital Status: Widowed  ROS  Review of Systems  Neurological:  Positive for aphonia and sensory change.  All other systems reviewed and are negative. Objective  Blood pressure (!) 180/79, pulse 74, temperature 98.7 F (37.1 C), temperature source Temporal, resp. rate 16, height '5\' 2"'$  (1.575 m), weight 123 lb (55.8 kg), SpO2 99 %. Body mass index is 22.5 kg/m.     07/25/2022    1:02 PM 10/10/2021    7:45 PM 10/10/2021    6:45 PM  Vitals with BMI  Height '5\' 2"'$     Weight 123 lbs    BMI 33.82    Systolic 505 397 673  Diastolic 79 81 76  Pulse 74 96 93     Physical Exam Vitals reviewed.  Constitutional:      Appearance: Normal appearance. She is normal weight.  HENT:     Head: Normocephalic and atraumatic.  Neck:     Vascular: No carotid bruit.  Cardiovascular:     Rate and Rhythm: Normal rate and regular rhythm.     Pulses: Normal pulses.     Heart sounds: Normal heart sounds. No murmur heard. Pulmonary:     Effort: Pulmonary effort is normal.     Breath sounds: Normal breath sounds.  Abdominal:     General: Abdomen is flat. Bowel sounds are normal.     Palpations: Abdomen is soft.  Musculoskeletal:     Right lower leg: No edema.     Left lower leg: No edema.  Skin:    General: Skin is warm and dry.  Neurological:     Mental Status: She is alert.   Medications and allergies   Allergies  Allergen Reactions   Phenylephrine Other (See Comments)    Possible allergy to dilating drop phenylephrine 2.5% - USE Tropicamide 1% only to dilate Possible allergy to  dilating drop phenylephrine 2.5% - USE Tropicamide 1% only to dilate    Penicillins Rash     Medication list after today's encounter   Current Outpatient Medications:    amLODipine (NORVASC) 5 MG tablet, Take 1 tablet (5 mg total) by mouth at bedtime., Disp: 30 tablet, Rfl: 2   calcium carbonate (OS-CAL) 600 MG TABS, Take 600 mg by mouth 2 (two) times daily with a meal., Disp: , Rfl:    cetirizine (ZYRTEC) 10 MG tablet, Take 10 mg by mouth daily as needed for allergies. , Disp: , Rfl:    Cholecalciferol (VITAMIN D3) 25 MCG (1000 UT) CAPS, Take by mouth., Disp: , Rfl:    clopidogrel (PLAVIX) 75 MG tablet, Take 1 tablet (75 mg total) by mouth daily., Disp: 30 tablet, Rfl: 0   estradiol (ESTRACE) 0.1 MG/GM vaginal cream, Place 1 Applicatorful vaginally once a week., Disp: , Rfl:    ezetimibe (ZETIA) 10 MG tablet, Take 10 mg by mouth daily., Disp: , Rfl:    losartan-hydrochlorothiazide (HYZAAR) 50-12.5 MG tablet, Take 1 tablet by mouth every morning., Disp: 30 tablet, Rfl: 2   Probiotic CAPS, Take 2 capsules by mouth daily., Disp: , Rfl:    Propylene Glycol (SYSTANE BALANCE OP), Apply 1 drop to eye at bedtime., Disp: , Rfl:    rosuvastatin (CRESTOR) 40 MG tablet, Take 1 tablet (40 mg total) by mouth at bedtime., Disp: 30 tablet, Rfl: 2   traMADol (ULTRAM) 50 MG tablet, Take 50 mg by mouth every 6 (six) hours as needed., Disp: , Rfl:   Laboratory examination:   Lab Results  Component Value Date   NA 140 07/21/2015   K 4.4 07/21/2015   CO2 29 07/21/2015   GLUCOSE 107 (H) 07/21/2015   BUN 21 (H) 07/21/2015   CREATININE 1.10 (H) 07/20/2022   CALCIUM 9.6 07/21/2015   GFRNONAA 37 (L) 07/21/2015       Latest Ref Rng & Units 07/20/2022    5:13 PM 07/21/2015    1:35 PM  CMP  Glucose 65 - 99 mg/dL  107   BUN 6 - 20 mg/dL  21   Creatinine 0.44 - 1.00 mg/dL 1.10  1.32   Sodium 135 - 145 mmol/L  140   Potassium 3.5 - 5.1 mmol/L  4.4   Chloride 101 - 111 mmol/L  105   CO2 22 - 32 mmol/L  29  Calcium 8.9 - 10.3 mg/dL  9.6       Latest Ref Rng & Units 07/21/2015    1:35 PM  CBC  WBC 4.0 - 10.5 K/uL 5.6   Hemoglobin 12.0 - 15.0 g/dL 13.0   Hematocrit 36.0 - 46.0 % 38.3   Platelets 150 - 400 K/uL 200     Lipid Panel No results for input(s): "CHOL", "TRIG", "[place]", "VLDL", "HDL", "CHOLHDL", "LDLDIRECT" in the last 8760 hours.  HEMOGLOBIN A1C No results found for: "HGBA1C", "MPG" TSH No results for input(s): "TSH" in the last 8760 hours.  External labs:     Radiology:    Cardiac Studies:   ECHO COMPLETE WITH IMAGING ENHANCING AGENT 07/24/2022  Narrative ECHOCARDIOGRAM REPORT    Patient Name:   Shannon Bowman Date of Exam: 07/24/2022 Medical Rec #:  [id number]        Height:       62.0 in Accession #:    [id number]       Weight:       125.0 lb Date of Birth:  02/03/1932        BSA:          1.566 m Patient Age:    52 years         BP:           130/80 mmHg Patient Gender: F                HR:           71 bpm. Exam Location:  [address]  Procedure: 2D Echo, Cardiac Doppler, Color Doppler and Intracardiac Opacification Agent  Indications:    G45.9 TIA  History:        Patient has no prior history of Echocardiogram examinations. TIA; Risk Factors:Dyslipidemia.  Sonographer:    Coralyn Helling RDCS Referring Phys: [id number] Sueanne Margarita   Sonographer Comments: Technically difficult study due to poor echo windows. IMPRESSIONS   1. Left ventricular ejection fraction, by estimation, is 60 to 65%. The left ventricle has normal function. The left ventricle has no regional wall motion abnormalities. There is severe left ventricular hypertrophy. Left ventricular diastolic parameters are consistent with Grade I diastolic dysfunction (impaired relaxation). 2. Right ventricular systolic function is normal. The right ventricular size is normal. 3. The mitral valve is abnormal. Mild mitral valve regurgitation. No evidence of mitral stenosis. Moderate  mitral annular calcification. 4. The aortic valve is tricuspid. There is moderate calcification of the aortic valve. Aortic valve regurgitation is mild. Aortic valve sclerosis/calcification is present, without any evidence of aortic stenosis. 5. The inferior vena cava is normal in size with greater than 50% respiratory variability, suggesting right atrial pressure of 3 mmHg.  FINDINGS Left Ventricle: Left ventricular ejection fraction, by estimation, is 60 to 65%. The left ventricle has normal function. The left ventricle has no regional wall motion abnormalities. Definity contrast agent was given IV to delineate the left ventricular endocardial borders. The left ventricular internal cavity size was normal in size. There is severe left ventricular hypertrophy. Left ventricular diastolic parameters are consistent with Grade I diastolic dysfunction (impaired relaxation).  Right Ventricle: The right ventricular size is normal. No increase in right ventricular wall thickness. Right ventricular systolic function is normal.  Left Atrium: Left atrial size was normal in size.  Right Atrium: Right atrial size was normal in size.  Pericardium: There is no evidence of pericardial effusion.  Mitral Valve: The mitral valve is abnormal. Moderate mitral annular calcification.  Mild mitral valve regurgitation. No evidence of mitral valve stenosis.  Tricuspid Valve: The tricuspid valve is normal in structure. Tricuspid valve regurgitation is not demonstrated. No evidence of tricuspid stenosis.  Aortic Valve: The aortic valve is tricuspid. There is moderate calcification of the aortic valve. Aortic valve regurgitation is mild. Aortic regurgitation PHT measures 378 msec. Aortic valve sclerosis/calcification is present, without any evidence of aortic stenosis.  Pulmonic Valve: The pulmonic valve was normal in structure. Pulmonic valve regurgitation is not visualized. No evidence of pulmonic stenosis.  Aorta: The  aortic root is normal in size and structure.  Venous: The inferior vena cava is normal in size with greater than 50% respiratory variability, suggesting right atrial pressure of 3 mmHg.  IAS/Shunts: No atrial level shunt detected by color flow Doppler.   LEFT VENTRICLE PLAX 2D LVIDd:         3.40 cm   Diastology LVIDs:         2.40 cm   LV e' medial:    5.33 cm/s LV PW:         1.30 cm   LV E/e' medial:  8.9 LV IVS:        1.60 cm   LV e' lateral:   5.77 cm/s LVOT diam:     2.10 cm   LV E/e' lateral: 8.2 LV SV:         81 LV SV Index:   52 LVOT Area:     3.46 cm   RIGHT VENTRICLE            IVC RVSP:           25.8 mmHg  IVC diam: 1.60 cm  LEFT ATRIUM             Index        RIGHT ATRIUM LA diam:        3.10 cm 1.98 cm/m   RA Pressure: 3.00 mmHg LA Vol (A2C):   42.1 ml 26.89 ml/m LA Vol (A4C):   30.2 ml 19.29 ml/m LA Biplane Vol: 37.8 ml 24.14 ml/m AORTIC VALVE LVOT Vmax:   125.00 cm/s LVOT Vmean:  82.300 cm/s LVOT VTI:    0.235 m AI PHT:      378 msec  AORTA Ao Root diam: 3.50 cm Ao Asc diam:  3.60 cm  MITRAL VALVE                TRICUSPID VALVE MV Area (PHT): 2.14 cm     TR Peak grad:   22.8 mmHg MV Decel Time: 355 msec     TR Vmax:        239.00 cm/s MV E velocity: 47.20 cm/s   Estimated RAP:  3.00 mmHg MV A velocity: 106.00 cm/s  RVSP:           25.8 mmHg MV E/A ratio:  0.45 SHUNTS Systemic VTI:  0.24 m Systemic Diam: 2.10 cm  Jenkins Rouge MD Electronically signed by Jenkins Rouge MD Signature Date/Time: 07/24/2022/2:47:32 PM    Final     No results found for this or any previous visit from the past 1095 days.     EKG:   07/25/2022 EKG normal sinus rhythm  Assessment     ICD-10-CM   1. TIA (transient ischemic attack)  G45.9 EKG 12-Lead    PCV CAROTID DUPLEX (BILATERAL)    2. Essential hypertension  I10     3. Hyperlipidemia LDL goal <70  E78.5  Orders Placed This Encounter  Procedures   EKG 12-Lead    Meds ordered this  encounter  Medications   amLODipine (NORVASC) 5 MG tablet    Sig: Take 1 tablet (5 mg total) by mouth at bedtime.    Dispense:  30 tablet    Refill:  2   losartan-hydrochlorothiazide (HYZAAR) 50-12.5 MG tablet    Sig: Take 1 tablet by mouth every morning.    Dispense:  30 tablet    Refill:  2   rosuvastatin (CRESTOR) 40 MG tablet    Sig: Take 1 tablet (40 mg total) by mouth at bedtime.    Dispense:  30 tablet    Refill:  2   clopidogrel (PLAVIX) 75 MG tablet    Sig: Take 1 tablet (75 mg total) by mouth daily.    Dispense:  30 tablet    Refill:  0    Medications Discontinued During This Encounter  Medication Reason   ALPRAZolam (XANAX) 0.25 MG tablet    glucosamine-chondroitin 500-400 MG tablet    hydrocortisone (ANUSOL-HC) 2.5 % rectal cream    KRILL OIL OMEGA-3 PO    lidocaine (XYLOCAINE) 2 % solution    meloxicam (MOBIC) 7.5 MG tablet    oxyCODONE-acetaminophen (PERCOCET/ROXICET) 5-325 MG per tablet      Recommendations:   Shannon Bowman is a 88 y.o. female who had a recent TIA  Recommend aggressive risk factor modification Blood pressure medications and statin sent to pharmacy Plavix 75 mg for 30 days Echo and EKG reviewed Bilateral carotid ultrasound ordered Follow-up in 6 weeks or sooner if needed    Floydene Flock, DO, [employer]  07/25/2022, 3:49 PM Office: [phone] Pager: [phone]

## 2022-08-14 ENCOUNTER — Telehealth: Payer: Self-pay

## 2022-08-14 NOTE — Telephone Encounter (Signed)
Patient called and states that she believes that she is having a reaction from the new medications that she started after her last OV with (Dr. Shellia Carwin) she states that she has been having  dizziness spells, Fatigue, shortness of breath, and her BP has been fluctuating from 80/40, 134/53, and this morning she states he BP was 85/75, she said that she was advised by Dr. Shellia Carwin to call if she had any issues.   Please advise

## 2022-08-14 NOTE — Telephone Encounter (Signed)
Spoke with patient and advised her of the instructions given by Dr. Shellia Carwin. Patient gave verbal understanding and states she will call back if things do not get any better

## 2022-08-14 NOTE — Telephone Encounter (Signed)
LVM for patient to return call to discuss above information

## 2022-08-14 NOTE — Telephone Encounter (Signed)
Tell her to stop the losartan/hctz combo and just take amlodipine at night

## 2022-08-17 ENCOUNTER — Ambulatory Visit: Payer: Medicare Other | Admitting: Cardiology

## 2022-08-17 ENCOUNTER — Encounter: Payer: Self-pay | Admitting: Cardiology

## 2022-08-17 ENCOUNTER — Telehealth: Payer: Self-pay

## 2022-08-17 ENCOUNTER — Ambulatory Visit: Payer: Medicare Other

## 2022-08-17 VITALS — BP 170/67 | HR 80 | Resp 16 | Ht 62.0 in | Wt 120.0 lb

## 2022-08-17 DIAGNOSIS — G459 Transient cerebral ischemic attack, unspecified: Secondary | ICD-10-CM

## 2022-08-17 DIAGNOSIS — R42 Dizziness and giddiness: Secondary | ICD-10-CM

## 2022-08-17 DIAGNOSIS — I10 Essential (primary) hypertension: Secondary | ICD-10-CM | POA: Diagnosis not present

## 2022-08-17 DIAGNOSIS — R0989 Other specified symptoms and signs involving the circulatory and respiratory systems: Secondary | ICD-10-CM

## 2022-08-17 DIAGNOSIS — E785 Hyperlipidemia, unspecified: Secondary | ICD-10-CM

## 2022-08-17 MED ORDER — ROSUVASTATIN CALCIUM 20 MG PO TABS: 20.0000 mg | ORAL_TABLET | Freq: Every day | ORAL | 1 refills | Status: DC

## 2022-08-17 NOTE — Telephone Encounter (Signed)
Tried calling pt no answer left a vm

## 2022-08-17 NOTE — Telephone Encounter (Signed)
Happy to see if available to see in next two min. I have a case in hospital after that. If not able to see, recommend holding amlodipine for now. Bring in for a visit with Dr. Shellia Carwin early next week, in that case.  Thanks MJP

## 2022-08-17 NOTE — Progress Notes (Unsigned)
Primary Physician/Referring:  Sueanne Margarita, DO  Patient ID: Irven Easterly, female    DOB: 1932-06-19, 88 y.o.   MRN: [id number]  Chief Complaint  Patient presents with   Dizziness   Shortness of Breath   Fatigue   HPI:    MAREENA CAVAN  is a 88 y.o. female with past medical history significant for CKD, hypertension, and hyperlipidemia, and recent TIA. She did had a extensive brain imaging the hospital which did not show a stroke and the patient was diagnosed with TIA. At last visit she was started on amlodipine and losartan/ hctz for blood pressure control. On 9/5 she called office to report dizziness, fatigue, shortness of breath, and low blood pressure readings. She was advised to stop losartan/ hctz and continue amlodipine at bedtime. She has not taken the losartan/hctz since 9/5 and she did not take amlodipine last night. She presents today with complaints of ongoing dizziness and low blood pressure.   Past Medical History:  Diagnosis Date   Arthritis    Cataract, nuclear sclerotic, left eye 06/02/2020   The nature of cataract was discussed with the patient as well as the elective nature of surgery. The patient was reassured that surgery at a later date does not put the patient at risk for a worse outcome. It was emphasized that the need for surgery is dictated by the patient's quality of life as influenced by the cataract. Patient was instructed to maintain close follow up with their general eye    Chronic kidney disease    states Dr. Delfina Redwood told her CKD stage 3   GERD (gastroesophageal reflux disease)    History of hiatal hernia    Spinal stenosis    Past Surgical History:  Procedure Laterality Date   COLONOSCOPY     eyelid surgery     to improve drooping of eyelids   INGUINAL HERNIA REPAIR N/A 07/29/2015   Procedure: LAPAROSCOPIC INGUINAL HERNIA REPAIR WITH MESH;  Surgeon: Greer Pickerel, MD;  Location: [facility] OR;  Service: General;  Laterality: N/A;   MULTIPLE TOOTH  EXTRACTIONS     TONSILLECTOMY     UPPER GI ENDOSCOPY     Family History  Problem Relation Age of Onset   Stroke Mother    Diabetes Mother    Stroke Father     Social History   Tobacco Use   Smoking status: Former    Packs/day: 0.10    Years: 3.00    Total pack years: 0.30    Types: Cigarettes    Quit date: 12/11/1963    Years since quitting: 58.7   Smokeless tobacco: Never  Substance Use Topics   Alcohol use: Yes    Comment: 1/day (wine)   Marital Status: Widowed  ROS  Review of Systems  Constitutional: Positive for malaise/fatigue.  Cardiovascular:  Negative for chest pain, leg swelling and palpitations.  Respiratory:  Positive for shortness of breath.   Neurological:  Positive for dizziness.  All other systems reviewed and are negative.  Objective  Blood pressure (!) 170/67, pulse 80, resp. rate 16, height '5\' 2"'  (1.575 m), weight 120 lb (54.4 kg), SpO2 97 %. Body mass index is 21.95 kg/m.     08/17/2022    2:38 PM 07/25/2022    1:02 PM 10/10/2021    7:45 PM  Vitals with BMI  Height '5\' 2"'  '5\' 2"'    Weight 120 lbs 123 lbs   BMI 24.23 53.61   Systolic 443 154 008  Diastolic 67  79 81  Pulse 80 74 96    Orthostatic VS for the past 72 hrs (Last 3 readings):  Orthostatic BP Patient Position BP Location Cuff Size Orthostatic Pulse  08/17/22 1444 154/69 Standing Left Arm Normal 86  08/17/22 1443 156/73 Sitting Left Arm Normal 79  08/17/22 1442 167/75 Supine Left Arm Normal 67     Physical Exam Vitals reviewed.  Neck:     Vascular: No carotid bruit.  Cardiovascular:     Rate and Rhythm: Normal rate and regular rhythm.     Pulses: Normal pulses.     Heart sounds: Normal heart sounds. No murmur heard. Pulmonary:     Effort: Pulmonary effort is normal.     Breath sounds: Normal breath sounds.  Abdominal:     General: Abdomen is flat. Bowel sounds are normal. There is abdominal bruit.     Palpations: Abdomen is soft.  Musculoskeletal:     Right lower leg: No edema.      Left lower leg: No edema.  Neurological:     Mental Status: She is alert.    Medications and allergies   Allergies  Allergen Reactions   Phenylephrine Other (See Comments)    Possible allergy to dilating drop phenylephrine 2.5% - USE Tropicamide 1% only to dilate Possible allergy to dilating drop phenylephrine 2.5% - USE Tropicamide 1% only to dilate    Penicillins Rash     Medication list after today's encounter   Current Outpatient Medications:    amLODipine (NORVASC) 5 MG tablet, Take 1 tablet (5 mg total) by mouth at bedtime., Disp: 30 tablet, Rfl: 2   calcium carbonate (OS-CAL) 600 MG TABS, Take 600 mg by mouth 2 (two) times daily with a meal., Disp: , Rfl:    cetirizine (ZYRTEC) 10 MG tablet, Take 10 mg by mouth daily as needed for allergies. , Disp: , Rfl:    Cholecalciferol (VITAMIN D3) 25 MCG (1000 UT) CAPS, Take by mouth., Disp: , Rfl:    clopidogrel (PLAVIX) 75 MG tablet, Take 1 tablet (75 mg total) by mouth daily., Disp: 30 tablet, Rfl: 0   estradiol (ESTRACE) 0.1 MG/GM vaginal cream, Place 1 Applicatorful vaginally once a week., Disp: , Rfl:    losartan-hydrochlorothiazide (HYZAAR) 50-12.5 MG tablet, Take 1 tablet by mouth every morning., Disp: 30 tablet, Rfl: 2   Probiotic CAPS, Take 2 capsules by mouth daily., Disp: , Rfl:    Propylene Glycol (SYSTANE BALANCE OP), Apply 1 drop to eye at bedtime., Disp: , Rfl:    rosuvastatin (CRESTOR) 20 MG tablet, Take 1 tablet (20 mg total) by mouth daily., Disp: 90 tablet, Rfl: 1   traMADol (ULTRAM) 50 MG tablet, Take 50 mg by mouth every 6 (six) hours as needed., Disp: , Rfl:    ezetimibe (ZETIA) 10 MG tablet, Take 1 tablet (10 mg total) by mouth daily., Disp: , Rfl:   Laboratory examination:   Lab Results  Component Value Date   NA 140 07/21/2015   K 4.4 07/21/2015   CO2 29 07/21/2015   GLUCOSE 107 (H) 07/21/2015   BUN 21 (H) 07/21/2015   CREATININE 1.10 (H) 07/20/2022   CALCIUM 9.6 07/21/2015   GFRNONAA 37 (L)  07/21/2015       Latest Ref Rng & Units 07/20/2022    5:13 PM 07/21/2015    1:35 PM  CMP  Glucose 65 - 99 mg/dL  107   BUN 6 - 20 mg/dL  21   Creatinine 0.44 - 1.00 mg/dL 1.10  1.32   Sodium 135 - 145 mmol/L  140   Potassium 3.5 - 5.1 mmol/L  4.4   Chloride 101 - 111 mmol/L  105   CO2 22 - 32 mmol/L  29   Calcium 8.9 - 10.3 mg/dL  9.6       Latest Ref Rng & Units 07/21/2015    1:35 PM  CBC  WBC 4.0 - 10.5 K/uL 5.6   Hemoglobin 12.0 - 15.0 g/dL 13.0   Hematocrit 36.0 - 46.0 % 38.3   Platelets 150 - 400 K/uL 200     Lipid Panel No results for input(s): "CHOL", "TRIG", "[place]", "VLDL", "HDL", "CHOLHDL", "LDLDIRECT" in the last 8760 hours.  HEMOGLOBIN A1C No results found for: "HGBA1C", "MPG" TSH No results for input(s): "TSH" in the last 8760 hours.  External labs:     Radiology:    Cardiac Studies:   ECHO COMPLETE WITH IMAGING ENHANCING AGENT 07/24/2022  Narrative Echo 07/24/2022  1. Left ventricular ejection fraction, by estimation, is 60 to 65%. The left ventricle has normal function. The left ventricle has no regional wall motion abnormalities. There is severe left ventricular hypertrophy. Left ventricular diastolic parameters are consistent with Grade I diastolic dysfunction (impaired relaxation). 2. Right ventricular systolic function is normal. The right ventricular size is normal. 3. The mitral valve is abnormal. Mild mitral valve regurgitation. No evidence of mitral stenosis. Moderate mitral annular calcification. 4. The aortic valve is tricuspid. There is moderate calcification of the aortic valve. Aortic valve regurgitation is mild. Aortic valve sclerosis/calcification is present, without any evidence of aortic stenosis. 5. The inferior vena cava is normal in size with greater than 50% respiratory variability, suggesting right atrial pressure of 3 mmHg.  EKG:   EKG 08/17/2022: Normal sinus rhythm at rate of 74 bpm, normal axis, poor R progression,  probably normal variant.  T wave abnormality, cannot exclude inferior ischemia.  PVCs (2).  Assessment     ICD-10-CM   1. TIA (transient ischemic attack)  G45.9     2. Essential hypertension  I10 PCV RENAL/RENAL ARTERY DUPLEX COMPLETE    3. Dizziness  R42 EKG 12-Lead    4. Hyperlipidemia LDL goal <70  E78.5 rosuvastatin (CRESTOR) 20 MG tablet    ezetimibe (ZETIA) 10 MG tablet    5. Abdominal bruit  R09.89 PCV RENAL/RENAL ARTERY DUPLEX COMPLETE       Orders Placed This Encounter  Procedures   EKG 12-Lead    Meds ordered this encounter  Medications   rosuvastatin (CRESTOR) 20 MG tablet    Sig: Take 1 tablet (20 mg total) by mouth daily.    Dispense:  90 tablet    Refill:  1    Medications Discontinued During This Encounter  Medication Reason   ezetimibe (ZETIA) 10 MG tablet    rosuvastatin (CRESTOR) 40 MG tablet      Recommendations:   JEWELDEAN DROHAN is a 88 y.o. female who had a recent TIA. She did had a extensive brain imaging the hospital which did not show a stroke and the patient was diagnosed with TIA. At last visit she was started on amlodipine and losartan/ hctz for blood pressure control. On 9/5 she called office to report dizziness, fatigue, shortness of breath, and low blood pressure readings. She was advised to stop losartan/ hctz and continue amlodipine at bedtime. She has not taken the losartan/hctz since 9/5 and she did not take amlodipine last night. She presents today with complaints of ongoing dizziness and low  blood pressure.  Her blood pressure was slightly elevated at today's visit and she was not orthostatic. We will restart the losatan/ hctz and continue amlodipine as ordered. She met with pharmacist and will enroll in RPM program. Discussed that if becomes dizzy or lightheaded she may call office. We will monitor her home blood pressure readings and adjust medications as needed.   On physical exam, she does have renal bruit. Would consider secondary  hypertension workup including renal artery duplex if blood pressure is not well controlled.  Given good lipid control in the past with Zetia 18m, plan to decrease the dose of rosuvastatin to 243mand restart Zetia 1051m  She will follow-up with Dr. CusShellia Carwin 9/27 or sooner if needed.    BriErnst SpellGNP-C  08/17/2022, 3:50 PM Office: [phone]: [phone]  JayAdrian ProwsD, FACAdvanced Surgical Care Of Baton Rouge LLC9/2023, 9:45 AM Office: [phone]: [phone]: [phone]

## 2022-08-17 NOTE — Telephone Encounter (Signed)
Patient was seen today.

## 2022-08-17 NOTE — Telephone Encounter (Signed)
Patient called back, another message in chart.

## 2022-08-17 NOTE — Telephone Encounter (Signed)
Correction: 10 min

## 2022-08-17 NOTE — Telephone Encounter (Signed)
Patient came into office today and has complaints of low blood pressure 90/40 but average 100/low 50's. HR averaging high 80's. Patient has complaints of fatigue, dizziness, and SOB.  Patient stated that she has never been diagnosed with high BP in the past, but has recently had a TIA and was put on a few new medications ( Losartan-HCTZ and Amlodipine) and Rosuvastatin) Patient is just concerned that she her BP may drop to low over the weekend and is afraid to be alone and pass out. She wants to be seen today if anyone if willing. If not, please advise.

## 2022-08-21 ENCOUNTER — Other Ambulatory Visit: Payer: Self-pay | Admitting: Internal Medicine

## 2022-08-24 ENCOUNTER — Telehealth: Payer: Self-pay

## 2022-08-24 NOTE — Telephone Encounter (Signed)
1-week follow-up from starting BP monitoring through RPM. No changes needed at this time. Will continue to follow.  Average Systolic BP Level 211.17 mmHg Lowest Systolic BP Level 356 mmHg Highest Systolic BP Level 701 mmHg  08/24/2022 Friday at 08:04 AM 118 / 66      08/23/2022 Thursday at 07:48 AM 124 / 61      08/22/2022 Wednesday at 07:31 AM 121 / 71      08/21/2022 Tuesday at 08:27 PM 117 / 65      08/21/2022 Tuesday at 07:30 AM 108 / 67      08/20/2022 Monday at 07:30 AM 116 / 71      08/19/2022 'Sunday at 03:42 PM 130 / 72      08/19/2022 Sunday at 07:46 AM 129 / 64      08/18/2022 Saturday at 07:57 PM 120 / 78      08/18/2022 Saturday at 08:19 AM 128 / 77      09'$ /07/2022 Friday at 04:04 PM 155 / 85  Current Meds  Medication Sig   amLODipine (NORVASC) 5 MG tablet Take 1 tablet (5 mg total) by mouth at bedtime.   losartan-hydrochlorothiazide (HYZAAR) 50-12.5 MG tablet Take 1 tablet by mouth every morning.

## 2022-09-05 ENCOUNTER — Ambulatory Visit: Payer: Medicare Other | Admitting: Internal Medicine

## 2022-09-05 ENCOUNTER — Encounter: Payer: Self-pay | Admitting: Internal Medicine

## 2022-09-05 VITALS — BP 153/68 | HR 93 | Temp 97.4°F | Resp 16 | Ht 62.0 in

## 2022-09-05 DIAGNOSIS — I10 Essential (primary) hypertension: Secondary | ICD-10-CM | POA: Diagnosis not present

## 2022-09-05 DIAGNOSIS — R0609 Other forms of dyspnea: Secondary | ICD-10-CM

## 2022-09-05 DIAGNOSIS — R42 Dizziness and giddiness: Secondary | ICD-10-CM | POA: Diagnosis not present

## 2022-09-05 NOTE — Progress Notes (Signed)
Primary Physician/Referring:  Sueanne Margarita, DO  Patient ID: Shannon Bowman, female    DOB: 1932-06-24, 88 y.o.   MRN: [id number]  No chief complaint on file.  HPI:    Shannon Bowman  is a 88 y.o. female with past medical history significant for CKD, hypertension, and hyperlipidemia, and recent TIA.  Patient is still having dizziness mostly with exertion.  She is also having dyspnea on exertion and she cannot even walk up a flight of stairs without getting short of breath now.  This is all very new for her.  6 months ago she was in [place] walking up various hills and walking all day without any symptoms whatsoever.  Patient has not had a stress test recently and she is agreeable to this.  She denies chest pain, palpitations, diaphoresis, syncope, claudication.  Past Medical History:  Diagnosis Date   Arthritis    Cataract, nuclear sclerotic, left eye 06/02/2020   The nature of cataract was discussed with the patient as well as the elective nature of surgery. The patient was reassured that surgery at a later date does not put the patient at risk for a worse outcome. It was emphasized that the need for surgery is dictated by the patient's quality of life as influenced by the cataract. Patient was instructed to maintain close follow up with their general eye    Chronic kidney disease    states Dr. Delfina Redwood told her CKD stage 3   GERD (gastroesophageal reflux disease)    History of hiatal hernia    Spinal stenosis    Past Surgical History:  Procedure Laterality Date   COLONOSCOPY     eyelid surgery     to improve drooping of eyelids   INGUINAL HERNIA REPAIR N/A 07/29/2015   Procedure: LAPAROSCOPIC INGUINAL HERNIA REPAIR WITH MESH;  Surgeon: Greer Pickerel, MD;  Location: [facility] OR;  Service: General;  Laterality: N/A;   MULTIPLE TOOTH EXTRACTIONS     TONSILLECTOMY     UPPER GI ENDOSCOPY     Family History  Problem Relation Age of Onset   Stroke Mother    Diabetes Mother    Stroke  Father     Social History   Tobacco Use   Smoking status: Former    Packs/day: 0.10    Years: 3.00    Total pack years: 0.30    Types: Cigarettes    Quit date: 12/11/1963    Years since quitting: 58.7   Smokeless tobacco: Never  Substance Use Topics   Alcohol use: Yes    Comment: 1/day (wine)   Marital Status: Widowed  ROS  Review of Systems  Constitutional: Negative for malaise/fatigue.  Cardiovascular:  Positive for dyspnea on exertion. Negative for chest pain, leg swelling and palpitations.  Respiratory:  Positive for shortness of breath.   Neurological:  Positive for dizziness and light-headedness.  All other systems reviewed and are negative.  Objective  Blood pressure (!) 153/68, pulse 93, temperature (!) 97.4 F (36.3 C), temperature source Temporal, resp. rate 16, height '5\' 2"'$  (1.575 m), SpO2 99 %. Body mass index is 21.95 kg/m.     09/05/2022    2:03 PM 08/17/2022    2:38 PM 07/25/2022    1:02 PM  Vitals with BMI  Height '5\' 2"'$  '5\' 2"'$  '5\' 2"'$   Weight  120 lbs 123 lbs  BMI  57.26 20.35  Systolic 597 416 384  Diastolic 68 67 79  Pulse 93 80 74  Orthostatic VS for the past 72 hrs (Last 3 readings):  Patient Position BP Location Cuff Size  09/05/22 1403 Sitting Left Arm Normal     Physical Exam Vitals reviewed.  Neck:     Vascular: No carotid bruit.  Cardiovascular:     Rate and Rhythm: Normal rate and regular rhythm.     Pulses: Normal pulses.     Heart sounds: Normal heart sounds. No murmur heard. Pulmonary:     Effort: Pulmonary effort is normal.     Breath sounds: Normal breath sounds.  Abdominal:     General: Abdomen is flat. Bowel sounds are normal. There is abdominal bruit.     Palpations: Abdomen is soft.  Musculoskeletal:     Right lower leg: No edema.     Left lower leg: No edema.  Neurological:     Mental Status: She is alert.   Medications and allergies   Allergies  Allergen Reactions   Phenylephrine Other (See Comments)    Possible  allergy to dilating drop phenylephrine 2.5% - USE Tropicamide 1% only to dilate Possible allergy to dilating drop phenylephrine 2.5% - USE Tropicamide 1% only to dilate    Penicillins Rash     Medication list after today's encounter   Current Outpatient Medications:    amLODipine (NORVASC) 5 MG tablet, Take 1 tablet (5 mg total) by mouth at bedtime., Disp: 30 tablet, Rfl: 2   calcium carbonate (OS-CAL) 600 MG TABS, Take 600 mg by mouth 2 (two) times daily with a meal., Disp: , Rfl:    cetirizine (ZYRTEC) 10 MG tablet, Take 10 mg by mouth daily as needed for allergies. , Disp: , Rfl:    Cholecalciferol (VITAMIN D3) 25 MCG (1000 UT) CAPS, Take by mouth., Disp: , Rfl:    clopidogrel (PLAVIX) 75 MG tablet, TAKE 1 TABLET BY MOUTH DAILY, Disp: 30 tablet, Rfl: 0   estradiol (ESTRACE) 0.1 MG/GM vaginal cream, Place 1 Applicatorful vaginally once a week., Disp: , Rfl:    ezetimibe (ZETIA) 10 MG tablet, Take 1 tablet (10 mg total) by mouth daily., Disp: , Rfl:    losartan-hydrochlorothiazide (HYZAAR) 50-12.5 MG tablet, Take 1 tablet by mouth every morning., Disp: 30 tablet, Rfl: 2   Probiotic CAPS, Take 2 capsules by mouth daily., Disp: , Rfl:    Propylene Glycol (SYSTANE BALANCE OP), Apply 1 drop to eye at bedtime., Disp: , Rfl:    rosuvastatin (CRESTOR) 20 MG tablet, Take 1 tablet (20 mg total) by mouth daily., Disp: 90 tablet, Rfl: 1   traMADol (ULTRAM) 50 MG tablet, Take 50 mg by mouth every 6 (six) hours as needed., Disp: , Rfl:   Laboratory examination:   Lab Results  Component Value Date   NA 140 07/21/2015   K 4.4 07/21/2015   CO2 29 07/21/2015   GLUCOSE 107 (H) 07/21/2015   BUN 21 (H) 07/21/2015   CREATININE 1.10 (H) 07/20/2022   CALCIUM 9.6 07/21/2015   GFRNONAA 37 (L) 07/21/2015       Latest Ref Rng & Units 07/20/2022    5:13 PM 07/21/2015    1:35 PM  CMP  Glucose 65 - 99 mg/dL  107   BUN 6 - 20 mg/dL  21   Creatinine 0.44 - 1.00 mg/dL 1.10  1.32   Sodium 135 - 145 mmol/L   140   Potassium 3.5 - 5.1 mmol/L  4.4   Chloride 101 - 111 mmol/L  105   CO2 22 - 32 mmol/L  29  Calcium 8.9 - 10.3 mg/dL  9.6       Latest Ref Rng & Units 07/21/2015    1:35 PM  CBC  WBC 4.0 - 10.5 K/uL 5.6   Hemoglobin 12.0 - 15.0 g/dL 13.0   Hematocrit 36.0 - 46.0 % 38.3   Platelets 150 - 400 K/uL 200     Lipid Panel No results for input(s): "CHOL", "TRIG", "[place]", "VLDL", "HDL", "CHOLHDL", "LDLDIRECT" in the last 8760 hours.  HEMOGLOBIN A1C No results found for: "HGBA1C", "MPG" TSH No results for input(s): "TSH" in the last 8760 hours.  External labs:     Radiology:    Cardiac Studies:   ECHO COMPLETE WITH IMAGING ENHANCING AGENT 07/24/2022  Narrative Echo 07/24/2022  1. Left ventricular ejection fraction, by estimation, is 60 to 65%. The left ventricle has normal function. The left ventricle has no regional wall motion abnormalities. There is severe left ventricular hypertrophy. Left ventricular diastolic parameters are consistent with Grade I diastolic dysfunction (impaired relaxation). 2. Right ventricular systolic function is normal. The right ventricular size is normal. 3. The mitral valve is abnormal. Mild mitral valve regurgitation. No evidence of mitral stenosis. Moderate mitral annular calcification. 4. The aortic valve is tricuspid. There is moderate calcification of the aortic valve. Aortic valve regurgitation is mild. Aortic valve sclerosis/calcification is present, without any evidence of aortic stenosis. 5. The inferior vena cava is normal in size with greater than 50% respiratory variability, suggesting right atrial pressure of 3 mmHg.  EKG:   EKG 08/17/2022: Normal sinus rhythm at rate of 74 bpm, normal axis, poor R progression, probably normal variant.  T wave abnormality, cannot exclude inferior ischemia.  PVCs (2).  Assessment     ICD-10-CM   1. DOE (dyspnea on exertion)  R06.09 PCV MYOCARDIAL PERFUSION WO LEXISCAN    2. Essential  hypertension  I10 PCV MYOCARDIAL PERFUSION WO LEXISCAN    3. Dizziness  R42 PCV MYOCARDIAL PERFUSION WO LEXISCAN       Orders Placed This Encounter  Procedures   PCV MYOCARDIAL PERFUSION WO LEXISCAN    Standing Status:   Future    Standing Expiration Date:   11/05/2022    No orders of the defined types were placed in this encounter.   There are no discontinued medications.    Recommendations:   Shannon Bowman is a 88 y.o. female who had a recent TIA.     DOE (dyspnea on exertion) Nuclear stress test ordered  Essential hypertension BP labile Enrolled in RPM Will reassess next visit  Renal u/s ordered  Dizziness Potentially anginal equivalent in patient as it is occurring with activity Nuclear stress test ordered Follow up in 4 weeks   TIA Take Plavix for 10 additional days, can stop after that  Total time spent with patient was 60 minutes and greater than 50% of that time was spent in counseling and coordination care with the patient regarding complex decision making and discussion as state above.       Floydene Flock, DO, [facility] 09/05/2022, 2:58 PM Office: [phone] Fax: [phone]

## 2022-09-14 DIAGNOSIS — I10 Essential (primary) hypertension: Secondary | ICD-10-CM | POA: Diagnosis not present

## 2022-09-18 ENCOUNTER — Ambulatory Visit: Payer: Medicare Other

## 2022-09-18 DIAGNOSIS — R42 Dizziness and giddiness: Secondary | ICD-10-CM

## 2022-09-18 DIAGNOSIS — R0609 Other forms of dyspnea: Secondary | ICD-10-CM | POA: Diagnosis not present

## 2022-09-18 DIAGNOSIS — I10 Essential (primary) hypertension: Secondary | ICD-10-CM

## 2022-09-19 ENCOUNTER — Other Ambulatory Visit: Payer: Self-pay | Admitting: Internal Medicine

## 2022-09-19 DIAGNOSIS — Z23 Encounter for immunization: Secondary | ICD-10-CM | POA: Diagnosis not present

## 2022-09-20 DIAGNOSIS — N952 Postmenopausal atrophic vaginitis: Secondary | ICD-10-CM | POA: Diagnosis not present

## 2022-09-20 DIAGNOSIS — Z6824 Body mass index (BMI) 24.0-24.9, adult: Secondary | ICD-10-CM | POA: Diagnosis not present

## 2022-09-20 DIAGNOSIS — Z01419 Encounter for gynecological examination (general) (routine) without abnormal findings: Secondary | ICD-10-CM | POA: Diagnosis not present

## 2022-09-20 DIAGNOSIS — L900 Lichen sclerosus et atrophicus: Secondary | ICD-10-CM | POA: Diagnosis not present

## 2022-09-24 ENCOUNTER — Telehealth: Payer: Self-pay

## 2022-09-24 NOTE — Telephone Encounter (Signed)
Patient called and states that she would like to come in sooner to be seen she states that she has not been feeling well she states that after her Cholesterol Medication was added she has started feeling Dizzy, and shortness of breath. She states that any type of physical activity causes her to lose her breath, she states that she cant even put on clothes without having to sit down and she can feel her heart pounding. Patient also states that she has a very weird feeling of a tremor or chill going through her body, as well as upper back pain. She would like to speak with you in person   Please advise

## 2022-09-25 NOTE — Telephone Encounter (Signed)
Done

## 2022-09-25 NOTE — Telephone Encounter (Signed)
Please have her scheduled

## 2022-09-26 ENCOUNTER — Ambulatory Visit: Payer: Medicare Other | Admitting: Internal Medicine

## 2022-09-26 ENCOUNTER — Ambulatory Visit
Admission: RE | Admit: 2022-09-26 | Discharge: 2022-09-26 | Disposition: A | Discharge status: Home / Self Care | Payer: Medicare Other | Source: Ambulatory Visit | Attending: Internal Medicine | Admitting: Internal Medicine

## 2022-09-26 ENCOUNTER — Encounter: Payer: Self-pay | Admitting: Internal Medicine

## 2022-09-26 ENCOUNTER — Telehealth: Payer: Self-pay

## 2022-09-26 ENCOUNTER — Encounter (HOSPITAL_COMMUNITY): Payer: Self-pay

## 2022-09-26 VITALS — BP 146/68 | HR 81 | Temp 97.4°F | Resp 16 | Ht 62.0 in | Wt 127.0 lb

## 2022-09-26 DIAGNOSIS — E785 Hyperlipidemia, unspecified: Secondary | ICD-10-CM

## 2022-09-26 DIAGNOSIS — R0609 Other forms of dyspnea: Secondary | ICD-10-CM | POA: Insufficient documentation

## 2022-09-26 DIAGNOSIS — R0602 Shortness of breath: Secondary | ICD-10-CM | POA: Diagnosis not present

## 2022-09-26 DIAGNOSIS — I10 Essential (primary) hypertension: Secondary | ICD-10-CM | POA: Diagnosis not present

## 2022-09-26 NOTE — Telephone Encounter (Signed)
Patient called and stated that she had questions regarding the rehab referral and the Pulmonary referral. She states that she has the appointment for the Pulmonary doctor, but wanted to know if she should wait to make an appointment with the rehab until after she goes to the pulmonologist, or should she just make the appointment .    Please Advise

## 2022-09-26 NOTE — Progress Notes (Signed)
Primary Physician/Referring:  Sueanne Margarita, DO  Patient ID: Shannon Bowman, female    DOB: Oct 31, 1932, 88 y.o.   MRN: [id number]  Chief Complaint  Patient presents with   Shortness of Breath    Patient states that the symptoms started about 3 weeks + , ago she states that she feels dizzy and out of breath when doing any kind of physical activities   Dizziness    Shortness of breath and dizziness    leg cramps    Leg cramps at night she states she has had this pain for while  also has upper back pain     HPI:    Shannon Bowman  is a 88 y.o. female with past medical history significant for CKD, hypertension, and hyperlipidemia, and recent TIA who is here for a follow-up visit.  Patient is still having dizziness mostly with exertion in addition to shortness of breath.  Her echocardiogram and stress test both came back normal.  Carotid duplex did not show any significant plaque in her carotid arteries.  She is also going to have a renal artery ultrasound at our office.  Patient's shortness of breath is likely not cardiac given all of the testing and benign findings.  Patient is agreeable to going to pulmonary rehab in addition to seeing a pulmonologist.  We will also send her for chest x-ray as she is still very short of breath.  She denies chest pain, palpitations, diaphoresis, syncope, claudication.  Past Medical History:  Diagnosis Date   Arthritis    Cataract, nuclear sclerotic, left eye 06/02/2020   The nature of cataract was discussed with the patient as well as the elective nature of surgery. The patient was reassured that surgery at a later date does not put the patient at risk for a worse outcome. It was emphasized that the need for surgery is dictated by the patient's quality of life as influenced by the cataract. Patient was instructed to maintain close follow up with their general eye    Chronic kidney disease    states Dr. Delfina Redwood told her CKD stage 3   GERD (gastroesophageal  reflux disease)    History of hiatal hernia    Spinal stenosis    Past Surgical History:  Procedure Laterality Date   COLONOSCOPY     eyelid surgery     to improve drooping of eyelids   INGUINAL HERNIA REPAIR N/A 07/29/2015   Procedure: LAPAROSCOPIC INGUINAL HERNIA REPAIR WITH MESH;  Surgeon: Greer Pickerel, MD;  Location: [facility] OR;  Service: General;  Laterality: N/A;   MULTIPLE TOOTH EXTRACTIONS     TONSILLECTOMY     UPPER GI ENDOSCOPY     Family History  Problem Relation Age of Onset   Stroke Mother    Diabetes Mother    Stroke Father     Social History   Tobacco Use   Smoking status: Former    Packs/day: 0.10    Years: 3.00    Total pack years: 0.30    Types: Cigarettes    Quit date: 12/11/1963    Years since quitting: 58.8   Smokeless tobacco: Never  Substance Use Topics   Alcohol use: Yes    Comment: 1/day (wine)   Marital Status: Widowed  ROS  Review of Systems  Constitutional: Negative for malaise/fatigue.  Cardiovascular:  Positive for dyspnea on exertion. Negative for chest pain, leg swelling and palpitations.  Respiratory:  Positive for shortness of breath.   Neurological:  Positive for  dizziness and light-headedness.  All other systems reviewed and are negative.  Objective  Blood pressure (!) 146/68, pulse 81, temperature (!) 97.4 F (36.3 C), temperature source Temporal, resp. rate 16, height '5\' 2"'$  (1.575 m), weight 127 lb (57.6 kg). Body mass index is 23.23 kg/m.     09/26/2022   11:04 AM 09/05/2022    2:03 PM 08/17/2022    2:38 PM  Vitals with BMI  Height '5\' 2"'$  '5\' 2"'$  '5\' 2"'$   Weight 127 lbs  120 lbs  BMI 09.23  30.07  Systolic 622 633 354  Diastolic 68 68 67  Pulse 81 93 80    Orthostatic VS for the past 72 hrs (Last 3 readings):  Patient Position BP Location Cuff Size  09/26/22 1104 Sitting Left Arm Normal     Physical Exam Vitals reviewed.  Neck:     Vascular: No carotid bruit.  Cardiovascular:     Rate and Rhythm: Normal rate and regular  rhythm.     Pulses: Normal pulses.     Heart sounds: Normal heart sounds. No murmur heard. Pulmonary:     Effort: Pulmonary effort is normal.     Breath sounds: Normal breath sounds.  Abdominal:     General: Abdomen is flat. Bowel sounds are normal. There is abdominal bruit.     Palpations: Abdomen is soft.  Musculoskeletal:     Right lower leg: No edema.     Left lower leg: No edema.  Neurological:     Mental Status: She is alert.    Medications and allergies   Allergies  Allergen Reactions   Phenylephrine Other (See Comments)    Possible allergy to dilating drop phenylephrine 2.5% - USE Tropicamide 1% only to dilate Possible allergy to dilating drop phenylephrine 2.5% - USE Tropicamide 1% only to dilate    Penicillins Rash     Medication list after today's encounter   Current Outpatient Medications:    amLODipine (NORVASC) 5 MG tablet, Take 1 tablet (5 mg total) by mouth at bedtime., Disp: 30 tablet, Rfl: 2   calcium carbonate (OS-CAL) 600 MG TABS, Take 600 mg by mouth 2 (two) times daily with a meal., Disp: , Rfl:    cetirizine (ZYRTEC) 10 MG tablet, Take 10 mg by mouth daily as needed for allergies. , Disp: , Rfl:    Cholecalciferol (VITAMIN D3) 25 MCG (1000 UT) CAPS, Take by mouth., Disp: , Rfl:    clopidogrel (PLAVIX) 75 MG tablet, TAKE 1 TABLET BY MOUTH DAILY, Disp: 30 tablet, Rfl: 0   estradiol (ESTRACE) 0.1 MG/GM vaginal cream, Place 1 Applicatorful vaginally once a week., Disp: , Rfl:    ezetimibe (ZETIA) 10 MG tablet, Take 1 tablet (10 mg total) by mouth daily., Disp: , Rfl:    losartan-hydrochlorothiazide (HYZAAR) 50-12.5 MG tablet, Take 1 tablet by mouth every morning., Disp: 30 tablet, Rfl: 2   Probiotic CAPS, Take 2 capsules by mouth daily., Disp: , Rfl:    Propylene Glycol (SYSTANE BALANCE OP), Apply 1 drop to eye at bedtime., Disp: , Rfl:    rosuvastatin (CRESTOR) 20 MG tablet, Take 1 tablet (20 mg total) by mouth daily., Disp: 90 tablet, Rfl: 1   traMADol  (ULTRAM) 50 MG tablet, Take 50 mg by mouth every 6 (six) hours as needed., Disp: , Rfl:   Laboratory examination:   Lab Results  Component Value Date   NA 140 07/21/2015   K 4.4 07/21/2015   CO2 29 07/21/2015   GLUCOSE 107 (H)  07/21/2015   BUN 21 (H) 07/21/2015   CREATININE 1.10 (H) 07/20/2022   CALCIUM 9.6 07/21/2015   GFRNONAA 37 (L) 07/21/2015       Latest Ref Rng & Units 07/20/2022    5:13 PM 07/21/2015    1:35 PM  CMP  Glucose 65 - 99 mg/dL  107   BUN 6 - 20 mg/dL  21   Creatinine 0.44 - 1.00 mg/dL 1.10  1.32   Sodium 135 - 145 mmol/L  140   Potassium 3.5 - 5.1 mmol/L  4.4   Chloride 101 - 111 mmol/L  105   CO2 22 - 32 mmol/L  29   Calcium 8.9 - 10.3 mg/dL  9.6       Latest Ref Rng & Units 07/21/2015    1:35 PM  CBC  WBC 4.0 - 10.5 K/uL 5.6   Hemoglobin 12.0 - 15.0 g/dL 13.0   Hematocrit 36.0 - 46.0 % 38.3   Platelets 150 - 400 K/uL 200     Lipid Panel No results for input(s): "CHOL", "TRIG", "LDLCALC", "VLDL", "HDL", "CHOLHDL", "LDLDIRECT" in the last 8760 hours.  HEMOGLOBIN A1C No results found for: "HGBA1C", "MPG" TSH No results for input(s): "TSH" in the last 8760 hours.  External labs:     Radiology:    Cardiac Studies:   ECHO COMPLETE WITH IMAGING ENHANCING AGENT 07/24/2022  Narrative Echo 07/24/2022  1. Left ventricular ejection fraction, by estimation, is 60 to 65%. The left ventricle has normal function. The left ventricle has no regional wall motion abnormalities. There is severe left ventricular hypertrophy. Left ventricular diastolic parameters are consistent with Grade I diastolic dysfunction (impaired relaxation). 2. Right ventricular systolic function is normal. The right ventricular size is normal. 3. The mitral valve is abnormal. Mild mitral valve regurgitation. No evidence of mitral stenosis. Moderate mitral annular calcification. 4. The aortic valve is tricuspid. There is moderate calcification of the aortic valve. Aortic valve  regurgitation is mild. Aortic valve sclerosis/calcification is present, without any evidence of aortic stenosis. 5. The inferior vena cava is normal in size with greater than 50% respiratory variability, suggesting right atrial pressure of 3 mmHg.   Exercise nuclear stress test 09/18/2022: Myocardial perfusion is normal. Overall LV systolic function is normal without regional wall motion abnormalities. Stress LV EF: 70%.  Normal ECG stress. The patient exercised for 2 minutes and 35 seconds of a Bruce protocol, achieving approximately 4.64 METs. and 90% of MPHR. Poor exercise tolerance. The blood pressure response was normal. No previous exam available for comparison. Low risk.    Carotid artery duplex 08/17/2022:  Duplex suggests stenosis in the right internal carotid artery (1-15%).  Duplex suggests stenosis in the left internal carotid artery (1-15%).  Minimal heterogeneous plaque noted in carotid arteries.  Antegrade right vertebral artery flow. Antegrade left vertebral artery  flow.  EKG:   EKG 08/17/2022: Normal sinus rhythm at rate of 74 bpm, normal axis, poor R progression, probably normal variant.  T wave abnormality, cannot exclude inferior ischemia.  PVCs (2).  Assessment     ICD-10-CM   1. DOE (dyspnea on exertion)  R06.09 AMB referral to Pulmonary Rehabilitation Maintenance Program ([place] Only)    DG Chest 2 View    Ambulatory referral to Pulmonology    2. Essential hypertension  I10 AMB referral to Pulmonary Rehabilitation Maintenance Program ([place] Only)    DG Chest 2 View    Ambulatory referral to Pulmonology    3. Hyperlipidemia LDL goal <70  E78.5  AMB referral to Pulmonary Rehabilitation Maintenance Program ([place] Only)    DG Chest 2 View    Ambulatory referral to Pulmonology       Orders Placed This Encounter  Procedures   DG Chest 2 View    Standing Status:   Future    Number of Occurrences:   1    Standing Expiration Date:   11/26/2022    Order Specific  Question:   Reason for Exam (SYMPTOM  OR DIAGNOSIS REQUIRED)    Answer:   shortness of breath    Order Specific Question:   Preferred imaging location?    Answer:   [facility]    Order Specific Question:   Radiology Contrast Protocol - do NOT remove file path    Answer:   [email or web address]   AMB referral to Pulmonary Rehabilitation Maintenance Program ([place] Only)    Referral Priority:   Routine    Referral Type:   Rehabilitation    Number of Visits Requested:   1   Ambulatory referral to Pulmonology    Referral Priority:   Routine    Referral Type:   Consultation    Referral Reason:   Specialty Services Required    Requested Specialty:   Pulmonary Disease    Number of Visits Requested:   1    No orders of the defined types were placed in this encounter.   There are no discontinued medications.    Recommendations:   Shannon Bowman is a 88 y.o. female who had a recent TIA.     DOE (dyspnea on exertion) Nuclear stress test negative, very low exercise tolerance We will send him to pulmonary rehab Chest x-ray ordered Pulmonary medicine referral provided  Essential hypertension Blood pressure still high when patient comes to the office, however better controlled at home Renal u/s ordered  Dizziness Carotid duplex negative for significant stenosis Nuclear stress test negative for ischemia No LVOT obstruction on echo   Follow-up in 2 to 3 months or sooner if needed       Floydene Flock, DO, [facility] 09/26/2022, 1:58 PM Office: [phone] Fax: [phone]

## 2022-09-27 ENCOUNTER — Other Ambulatory Visit (HOSPITAL_COMMUNITY): Payer: Self-pay | Admitting: *Deleted

## 2022-09-27 DIAGNOSIS — D3131 Benign neoplasm of right choroid: Secondary | ICD-10-CM | POA: Diagnosis not present

## 2022-09-27 DIAGNOSIS — J410 Simple chronic bronchitis: Secondary | ICD-10-CM

## 2022-09-27 DIAGNOSIS — Z961 Presence of intraocular lens: Secondary | ICD-10-CM | POA: Diagnosis not present

## 2022-09-27 DIAGNOSIS — J40 Bronchitis, not specified as acute or chronic: Secondary | ICD-10-CM

## 2022-09-27 DIAGNOSIS — H353131 Nonexudative age-related macular degeneration, bilateral, early dry stage: Secondary | ICD-10-CM | POA: Diagnosis not present

## 2022-09-27 NOTE — Telephone Encounter (Signed)
Spoke with patient and she voiced a verbal understanding regarding message above

## 2022-09-27 NOTE — Telephone Encounter (Signed)
Yes she can wait until she sees the lung doctor

## 2022-10-04 ENCOUNTER — Ambulatory Visit: Payer: Medicare Other

## 2022-10-04 DIAGNOSIS — I10 Essential (primary) hypertension: Secondary | ICD-10-CM

## 2022-10-04 DIAGNOSIS — R0989 Other specified symptoms and signs involving the circulatory and respiratory systems: Secondary | ICD-10-CM | POA: Diagnosis not present

## 2022-10-07 NOTE — Progress Notes (Signed)
Are you able to you rename these findings as renal artery duplex? Thank you!

## 2022-10-08 ENCOUNTER — Ambulatory Visit: Payer: PRIVATE HEALTH INSURANCE | Admitting: Internal Medicine

## 2022-10-15 ENCOUNTER — Other Ambulatory Visit: Payer: Self-pay

## 2022-10-15 DIAGNOSIS — I10 Essential (primary) hypertension: Secondary | ICD-10-CM | POA: Diagnosis not present

## 2022-10-15 MED ORDER — AMLODIPINE BESYLATE 5 MG PO TABS: 5.0000 mg | ORAL_TABLET | Freq: Every day | ORAL | 1 refills | Status: DC

## 2022-10-15 MED ORDER — LOSARTAN POTASSIUM-HCTZ 50-12.5 MG PO TABS: 1.0000 | ORAL_TABLET | Freq: Every morning | ORAL | 1 refills | Status: DC

## 2022-10-16 ENCOUNTER — Telehealth (HOSPITAL_COMMUNITY): Payer: Self-pay

## 2022-10-16 ENCOUNTER — Encounter: Payer: Self-pay | Admitting: Pulmonary Disease

## 2022-10-16 ENCOUNTER — Ambulatory Visit (INDEPENDENT_AMBULATORY_CARE_PROVIDER_SITE_OTHER): Payer: Medicare Other | Admitting: Pulmonary Disease

## 2022-10-16 VITALS — BP 116/78 | HR 86 | Ht 62.0 in | Wt 127.0 lb

## 2022-10-16 DIAGNOSIS — R0609 Other forms of dyspnea: Secondary | ICD-10-CM | POA: Diagnosis not present

## 2022-10-16 MED ORDER — FLUTICASONE-SALMETEROL 115-21 MCG/ACT IN AERO: 2.0000 | INHALATION_SPRAY | Freq: Two times a day (BID) | RESPIRATORY_TRACT | 1 refills | Status: DC

## 2022-10-16 NOTE — Patient Instructions (Signed)
Start advair inhaler 2 puffs twice daily - rinse mouth out after each use  We will schedule you for pulmonary function tests at earliest convenience and call you with results  Based on pulmonary function tests we will schedule you for repeat Chest x-ray or CT chest scan  Follow up in 2 months

## 2022-10-16 NOTE — Telephone Encounter (Signed)
No response from pt.  Closed referral  

## 2022-10-16 NOTE — Progress Notes (Unsigned)
Synopsis: Referred in November 2023 for shortness of breath by Floydene Flock, DO  Subjective:   PATIENT ID: Shannon Bowman GENDER: female DOB: 18-Aug-1932, MRN: [id number]  HPI  Chief Complaint  Patient presents with   Consult    Referred by cardiology for increased DOE and tremors for the past 2 months. Productive cough with clear phlegm.    Shortness of breath when getting dressed and bathed She had dyspnea trying to clean her den yesterday. She has to take multiple breaks. She will rest and take deep breaths and will sometimes have shakes. She denies loss of consciousness.   Duration: several months  August she had a TIA  +cough, mostly coughs up int he morning mucous. This has been on going.   Clears her throat a lot She has history of reflux, feels it is better now + hiatal hernia PRN   Not waking up at night from cough or shortness of breath.  She wakes up to go to the bathroom She lays flat on a mattress.   Her husband had parkinson's, she   Smoking in the past, when she was 20 and 60 years old.  Her father smoked cigars around her growing up.  Worked for [employer], Gaffer orders. No hobbies with dust or chemical exposures.   Mother had rheumatoid arthritis Cousin with rheumatoid   She complains of joint pains in her hands  Past Medical History:  Diagnosis Date   Arthritis    Cataract, nuclear sclerotic, left eye 06/02/2020   The nature of cataract was discussed with the patient as well as the elective nature of surgery. The patient was reassured that surgery at a later date does not put the patient at risk for a worse outcome. It was emphasized that the need for surgery is dictated by the patient's quality of life as influenced by the cataract. Patient was instructed to maintain close follow up with their general eye    Chronic kidney disease    states Dr. Delfina Redwood told her CKD stage 3   GERD (gastroesophageal reflux disease)    History  of hiatal hernia    Spinal stenosis      Family History  Problem Relation Age of Onset   Stroke Mother    Diabetes Mother    Stroke Father      Social History   Socioeconomic History   Marital status: Widowed    Spouse name: Not on file   Number of children: Not on file   Years of education: Not on file   Highest education level: Not on file  Occupational History   Not on file  Tobacco Use   Smoking status: Former    Packs/day: 0.10    Years: 3.00    Total pack years: 0.30    Types: Cigarettes    Quit date: 12/11/1963    Years since quitting: 58.8   Smokeless tobacco: Never  Substance and Sexual Activity   Alcohol use: Yes    Comment: 1/day (wine)   Drug use: No   Sexual activity: Not on file  Other Topics Concern   Not on file  Social History Narrative   Not on file   Social Determinants of Health   Financial Resource Strain: Not on file  Food Insecurity: Not on file  Transportation Needs: Not on file  Physical Activity: Not on file  Stress: Not on file  Social Connections: Not on file  Intimate Partner Violence: Not on file  Allergies  Allergen Reactions   Phenylephrine Other (See Comments)    Possible allergy to dilating drop phenylephrine 2.5% - USE Tropicamide 1% only to dilate Possible allergy to dilating drop phenylephrine 2.5% - USE Tropicamide 1% only to dilate    Penicillins Rash     Outpatient Medications Prior to Visit  Medication Sig Dispense Refill   amLODipine (NORVASC) 5 MG tablet Take 1 tablet (5 mg total) by mouth at bedtime. 90 tablet 1   calcium carbonate (OS-CAL) 600 MG TABS Take 600 mg by mouth 2 (two) times daily with a meal.     cetirizine (ZYRTEC) 10 MG tablet Take 10 mg by mouth daily as needed for allergies.      Cholecalciferol (VITAMIN D3) 25 MCG (1000 UT) CAPS Take by mouth.     clopidogrel (PLAVIX) 75 MG tablet TAKE 1 TABLET BY MOUTH DAILY 30 tablet 0   estradiol (ESTRACE) 0.1 MG/GM vaginal cream Place 1 Applicatorful  vaginally once a week.     ezetimibe (ZETIA) 10 MG tablet Take 1 tablet (10 mg total) by mouth daily.     losartan-hydrochlorothiazide (HYZAAR) 50-12.5 MG tablet Take 1 tablet by mouth every morning. 90 tablet 1   Probiotic CAPS Take 2 capsules by mouth daily.     Propylene Glycol (SYSTANE BALANCE OP) Apply 1 drop to eye at bedtime.     rosuvastatin (CRESTOR) 20 MG tablet Take 1 tablet (20 mg total) by mouth daily. 90 tablet 1   traMADol (ULTRAM) 50 MG tablet Take 50 mg by mouth every 6 (six) hours as needed.     No facility-administered medications prior to visit.    ROS    Objective:   Vitals:   10/16/22 1333  BP: 116/78  Pulse: 86  SpO2: 99%  Weight: 127 lb (57.6 kg)  Height: '5\' 2"'$  (1.575 m)     Physical Exam    CBC    Component Value Date/Time   WBC 5.6 07/21/2015 1335   RBC 4.05 07/21/2015 1335   HGB 13.0 07/21/2015 1335   HCT 38.3 07/21/2015 1335   PLT 200 07/21/2015 1335   MCV 94.6 07/21/2015 1335   MCH 32.1 07/21/2015 1335   MCHC 33.9 07/21/2015 1335   RDW 12.4 07/21/2015 1335     Chest imaging:  PFT:     No data to display          Labs:  Path:  Echo:  Heart Catheterization:  Assessment & Plan:   No diagnosis found.  Discussion: ***    Current Outpatient Medications:    amLODipine (NORVASC) 5 MG tablet, Take 1 tablet (5 mg total) by mouth at bedtime., Disp: 90 tablet, Rfl: 1   calcium carbonate (OS-CAL) 600 MG TABS, Take 600 mg by mouth 2 (two) times daily with a meal., Disp: , Rfl:    cetirizine (ZYRTEC) 10 MG tablet, Take 10 mg by mouth daily as needed for allergies. , Disp: , Rfl:    Cholecalciferol (VITAMIN D3) 25 MCG (1000 UT) CAPS, Take by mouth., Disp: , Rfl:    clopidogrel (PLAVIX) 75 MG tablet, TAKE 1 TABLET BY MOUTH DAILY, Disp: 30 tablet, Rfl: 0   estradiol (ESTRACE) 0.1 MG/GM vaginal cream, Place 1 Applicatorful vaginally once a week., Disp: , Rfl:    ezetimibe (ZETIA) 10 MG tablet, Take 1 tablet (10 mg total) by  mouth daily., Disp: , Rfl:    losartan-hydrochlorothiazide (HYZAAR) 50-12.5 MG tablet, Take 1 tablet by mouth every morning., Disp: 90 tablet, Rfl:  1   Probiotic CAPS, Take 2 capsules by mouth daily., Disp: , Rfl:    Propylene Glycol (SYSTANE BALANCE OP), Apply 1 drop to eye at bedtime., Disp: , Rfl:    rosuvastatin (CRESTOR) 20 MG tablet, Take 1 tablet (20 mg total) by mouth daily., Disp: 90 tablet, Rfl: 1   traMADol (ULTRAM) 50 MG tablet, Take 50 mg by mouth every 6 (six) hours as needed., Disp: , Rfl:

## 2022-10-17 ENCOUNTER — Encounter: Payer: Self-pay | Admitting: Pulmonary Disease

## 2022-10-18 ENCOUNTER — Telehealth (HOSPITAL_COMMUNITY): Payer: Self-pay

## 2022-10-18 NOTE — Telephone Encounter (Signed)
Pt called in regards to [place] and stated she is interested. Explained scheduling process, patient verbalized understanding.

## 2022-10-20 ENCOUNTER — Other Ambulatory Visit: Payer: Self-pay | Admitting: Internal Medicine

## 2022-10-24 DIAGNOSIS — R0602 Shortness of breath: Secondary | ICD-10-CM | POA: Diagnosis not present

## 2022-10-24 DIAGNOSIS — M8580 Other specified disorders of bone density and structure, unspecified site: Secondary | ICD-10-CM | POA: Diagnosis not present

## 2022-10-24 DIAGNOSIS — E785 Hyperlipidemia, unspecified: Secondary | ICD-10-CM | POA: Diagnosis not present

## 2022-10-24 DIAGNOSIS — D649 Anemia, unspecified: Secondary | ICD-10-CM | POA: Diagnosis not present

## 2022-10-24 DIAGNOSIS — I10 Essential (primary) hypertension: Secondary | ICD-10-CM | POA: Diagnosis not present

## 2022-10-24 DIAGNOSIS — R7989 Other specified abnormal findings of blood chemistry: Secondary | ICD-10-CM | POA: Diagnosis not present

## 2022-10-24 DIAGNOSIS — G459 Transient cerebral ischemic attack, unspecified: Secondary | ICD-10-CM | POA: Diagnosis not present

## 2022-10-24 DIAGNOSIS — G8929 Other chronic pain: Secondary | ICD-10-CM | POA: Diagnosis not present

## 2022-10-24 DIAGNOSIS — R110 Nausea: Secondary | ICD-10-CM | POA: Diagnosis not present

## 2022-10-24 DIAGNOSIS — N1831 Chronic kidney disease, stage 3a: Secondary | ICD-10-CM | POA: Diagnosis not present

## 2022-10-24 DIAGNOSIS — F419 Anxiety disorder, unspecified: Secondary | ICD-10-CM | POA: Diagnosis not present

## 2022-10-25 ENCOUNTER — Other Ambulatory Visit: Payer: Self-pay | Admitting: Internal Medicine

## 2022-10-25 ENCOUNTER — Ambulatory Visit
Admission: RE | Admit: 2022-10-25 | Discharge: 2022-10-25 | Disposition: A | Discharge status: Home / Self Care | Payer: Medicare Other | Source: Ambulatory Visit | Attending: Internal Medicine | Admitting: Internal Medicine

## 2022-10-25 DIAGNOSIS — R0602 Shortness of breath: Secondary | ICD-10-CM

## 2022-10-25 DIAGNOSIS — I700 Atherosclerosis of aorta: Secondary | ICD-10-CM | POA: Diagnosis not present

## 2022-10-25 DIAGNOSIS — I2510 Atherosclerotic heart disease of native coronary artery without angina pectoris: Secondary | ICD-10-CM | POA: Diagnosis not present

## 2022-10-25 DIAGNOSIS — K449 Diaphragmatic hernia without obstruction or gangrene: Secondary | ICD-10-CM | POA: Diagnosis not present

## 2022-10-25 DIAGNOSIS — G8929 Other chronic pain: Secondary | ICD-10-CM | POA: Diagnosis not present

## 2022-10-25 DIAGNOSIS — M549 Dorsalgia, unspecified: Secondary | ICD-10-CM | POA: Diagnosis not present

## 2022-10-25 MED ORDER — IOPAMIDOL (ISOVUE-370) INJECTION 76%
75.0000 mL | Freq: Once | INTRAVENOUS | Status: AC | PRN
  Administered 2022-10-25: 75 mL via INTRAVENOUS

## 2022-10-31 DIAGNOSIS — D649 Anemia, unspecified: Secondary | ICD-10-CM | POA: Diagnosis not present

## 2022-10-31 DIAGNOSIS — E785 Hyperlipidemia, unspecified: Secondary | ICD-10-CM | POA: Diagnosis not present

## 2022-10-31 DIAGNOSIS — N1831 Chronic kidney disease, stage 3a: Secondary | ICD-10-CM | POA: Diagnosis not present

## 2022-10-31 DIAGNOSIS — F419 Anxiety disorder, unspecified: Secondary | ICD-10-CM | POA: Diagnosis not present

## 2022-10-31 DIAGNOSIS — R0602 Shortness of breath: Secondary | ICD-10-CM | POA: Diagnosis not present

## 2022-10-31 DIAGNOSIS — E871 Hypo-osmolality and hyponatremia: Secondary | ICD-10-CM | POA: Diagnosis not present

## 2022-10-31 DIAGNOSIS — R110 Nausea: Secondary | ICD-10-CM | POA: Diagnosis not present

## 2022-10-31 DIAGNOSIS — I10 Essential (primary) hypertension: Secondary | ICD-10-CM | POA: Diagnosis not present

## 2022-10-31 DIAGNOSIS — M8580 Other specified disorders of bone density and structure, unspecified site: Secondary | ICD-10-CM | POA: Diagnosis not present

## 2022-10-31 DIAGNOSIS — G459 Transient cerebral ischemic attack, unspecified: Secondary | ICD-10-CM | POA: Diagnosis not present

## 2022-10-31 DIAGNOSIS — G8929 Other chronic pain: Secondary | ICD-10-CM | POA: Diagnosis not present

## 2022-10-31 DIAGNOSIS — K5900 Constipation, unspecified: Secondary | ICD-10-CM | POA: Diagnosis not present

## 2022-11-07 ENCOUNTER — Ambulatory Visit (HOSPITAL_COMMUNITY): Payer: Medicare Other

## 2022-11-07 ENCOUNTER — Telehealth (HOSPITAL_COMMUNITY): Payer: Self-pay | Admitting: Internal Medicine

## 2022-11-08 ENCOUNTER — Telehealth (HOSPITAL_COMMUNITY): Payer: Self-pay

## 2022-11-08 NOTE — Telephone Encounter (Signed)
Patient called and wanted to reschedule for Pulmonary Rehab Program. Patient will come in for orientation on 11/16/22'@10'$ :30am and will attend the 1:15pm exercise class.

## 2022-11-13 ENCOUNTER — Ambulatory Visit (HOSPITAL_COMMUNITY): Payer: Medicare Other

## 2022-11-14 DIAGNOSIS — I10 Essential (primary) hypertension: Secondary | ICD-10-CM | POA: Diagnosis not present

## 2022-11-15 ENCOUNTER — Ambulatory Visit (HOSPITAL_COMMUNITY): Payer: Medicare Other

## 2022-11-15 ENCOUNTER — Telehealth (HOSPITAL_COMMUNITY): Payer: Self-pay | Admitting: *Deleted

## 2022-11-15 NOTE — Telephone Encounter (Signed)
Called and confirmed with pt appointment for orientation tomorrow in [place]. Pt acknowledged appropriate attire. Yves Dill BS, ACSM-CEP 11/15/2022 9:39 AM

## 2022-11-16 ENCOUNTER — Encounter (HOSPITAL_COMMUNITY): Payer: Self-pay

## 2022-11-16 ENCOUNTER — Encounter (HOSPITAL_COMMUNITY)
Admission: RE | Admit: 2022-11-16 | Discharge: 2022-11-16 | Disposition: A | Discharge status: Home / Self Care | Payer: Medicare Other | Source: Ambulatory Visit | Attending: Internal Medicine | Admitting: Internal Medicine

## 2022-11-16 VITALS — BP 146/64 | HR 85 | Ht 62.0 in | Wt 130.7 lb

## 2022-11-16 DIAGNOSIS — J40 Bronchitis, not specified as acute or chronic: Secondary | ICD-10-CM | POA: Insufficient documentation

## 2022-11-16 DIAGNOSIS — J410 Simple chronic bronchitis: Secondary | ICD-10-CM | POA: Diagnosis not present

## 2022-11-16 HISTORY — DX: Unspecified cataract: H26.9

## 2022-11-16 NOTE — Progress Notes (Signed)
Shannon Bowman 88 y.o. female Pulmonary Rehab Orientation Note This patient who was referred to Pulmonary Rehab by Dr. Shellia Carwin with the diagnosis of simple chronic bronchitis arrived today in Cardiac and Pulmonary Rehab. She  arrived ambulatory with normal gait. She  does not carry portable oxygen. Physical assessment reveals, pt is alert and oriented x 4.  Heart rate is normal, breath sounds clear to auscultation, no wheezes, rales, or rhonchi, mild rales heard RLL and LLL. Reports non-productive cough. Bowel sounds present in all 4 quadrants.  Pt denies abdominal discomfort, nausea, vomiting or diarrhea. Grip strength equal, strong. Distal pulses present; +1 swelling to bilateral lower extremities. Patient's medical history, psychosocial health, and medications reviewed. Psychosocial assessment reveals patient lives with alone. Shannon Bowman is currently retired. Patient hobbies include spending time with others and shopping. Patient reports her stress level is low. Patient does not exhibit signs of depression. PHQ2/9 score 0/0. [name] shows good  coping skills with positive outlook on life. Marcha reports she does take medications as prescribed. Patient states she follows a regular  diet. The patient reports no specific efforts to gain or lose weight.. Pt's weight will be monitored closely. Demonstration and practice of PLB using pulse oximeter. Shannon Bowman able to return demonstration satisfactorily. Safety and hand hygiene in the exercise area reviewed with patient. Shannon Bowman voices understanding of the information reviewed. Department expectations discussed with patient and achievable goals were set. The patient shows enthusiasm about attending the program and we look forward to working with Shannon Bowman. Shannon Bowman completed a 6 min walk test today and is scheduled to begin exercise on 11/22/22 at 1:15pm.   [phone] Shannon Ores, RN

## 2022-11-16 NOTE — Progress Notes (Signed)
Pulmonary Individual Treatment Plan  Patient Details  Name: Shannon Bowman MRN: [id number] Date of Birth: 03-28-1932 Referring Provider:   April Manson Pulmonary Rehab Walk Test from 11/16/2022 in [facility] for Heart, Vascular, & [place]  Referring Provider Custovic       Initial Encounter Date:  Flowsheet Row Pulmonary Rehab Walk Test from 11/16/2022 in [facility] for Heart, Vascular, & [place]  Date 11/16/22       Visit Diagnosis: Simple chronic bronchitis ([place])  Patient's Home Medications on Admission:   Current Outpatient Medications:    acetaminophen (TYLENOL) 325 MG tablet, Take 650 mg by mouth every 6 (six) hours as needed for mild pain., Disp: , Rfl:    amLODipine (NORVASC) 5 MG tablet, TAKE 1 TABLET BY MOUTH AT BEDTIME, Disp: 90 tablet, Rfl: 1   ascorbic acid (VITAMIN C) 500 MG tablet, Take 500 mg by mouth daily., Disp: , Rfl:    aspirin EC 81 MG tablet, Take 81 mg by mouth daily. Swallow whole., Disp: , Rfl:    calcium carbonate (OS-CAL) 600 MG TABS, Take 600 mg by mouth 2 (two) times daily with a meal., Disp: , Rfl:    cetirizine (ZYRTEC) 10 MG tablet, Take 10 mg by mouth daily as needed for allergies. , Disp: , Rfl:    Cholecalciferol (VITAMIN D3) 25 MCG (1000 UT) CAPS, Take by mouth., Disp: , Rfl:    estradiol (ESTRACE) 0.1 MG/GM vaginal cream, Place 1 Applicatorful vaginally once a week., Disp: , Rfl:    ezetimibe (ZETIA) 10 MG tablet, Take 1 tablet (10 mg total) by mouth daily., Disp: , Rfl:    fluticasone-salmeterol (ADVAIR HFA) 115-21 MCG/ACT inhaler, Inhale 2 puffs into the lungs 2 (two) times daily., Disp: 1 each, Rfl: 1   Probiotic CAPS, Take 2 capsules by mouth daily., Disp: , Rfl:    Propylene Glycol (SYSTANE BALANCE OP), Apply 1 drop to eye at bedtime., Disp: , Rfl:    rosuvastatin (CRESTOR) 20 MG tablet, Take 1 tablet (20 mg total) by mouth daily., Disp: 90 tablet, Rfl: 1   clopidogrel (PLAVIX) 75  MG tablet, TAKE 1 TABLET BY MOUTH DAILY (Patient not taking: Reported on 11/16/2022), Disp: 30 tablet, Rfl: 0   losartan-hydrochlorothiazide (HYZAAR) 50-12.5 MG tablet, TAKE ONE TABLET BY MOUTH EVERY MORNING (Patient not taking: Reported on 11/16/2022), Disp: 90 tablet, Rfl: 1   rosuvastatin (CRESTOR) 40 MG tablet, TAKE 1 TABLET BY MOUTH AT BEDTIME (Patient not taking: Reported on 11/16/2022), Disp: 90 tablet, Rfl: 1   traMADol (ULTRAM) 50 MG tablet, Take 50 mg by mouth every 6 (six) hours as needed. (Patient not taking: Reported on 11/16/2022), Disp: , Rfl:   Past Medical History: Past Medical History:  Diagnosis Date   Arthritis    Cataract    right eye   Cataract, nuclear sclerotic, left eye 06/02/2020   The nature of cataract was discussed with the patient as well as the elective nature of surgery. The patient was reassured that surgery at a later date does not put the patient at risk for a worse outcome. It was emphasized that the need for surgery is dictated by the patient's quality of life as influenced by the cataract. Patient was instructed to maintain close follow up with their general eye    Chronic kidney disease    states Dr. Delfina Redwood told her CKD stage 3   GERD (gastroesophageal reflux disease)    History of hiatal hernia  Spinal stenosis    TIA (transient ischemic attack) 2023    Tobacco Use: Social History   Tobacco Use  Smoking Status Former   Packs/day: 0.10   Years: 3.00   Total pack years: 0.30   Types: Cigarettes   Quit date: 12/11/1963   Years since quitting: 58.9  Smokeless Tobacco Never    Labs: Review Flowsheet        No data to display          Capillary Blood Glucose: No results found for: "GLUCAP"   Pulmonary Assessment Scores:  Pulmonary Assessment Scores     Row Name 11/16/22 1112         ADL UCSD   SOB Score total 56       CAT Score   CAT Score 18       mMRC Score   mMRC Score 4             UCSD: Self-administered rating  of dyspnea associated with activities of daily living (ADLs) 6-point scale (0 = "not at all" to 5 = "maximal or unable to do because of breathlessness")  Scoring Scores range from 0 to 120.  Minimally important difference is 5 units  CAT: CAT can identify the health impairment of COPD patients and is better correlated with disease progression.  CAT has a scoring range of zero to 40. The CAT score is classified into four groups of low (less than 10), medium (10 - 20), high (21-30) and very high (31-40) based on the impact level of disease on health status. A CAT score over 10 suggests significant symptoms.  A worsening CAT score could be explained by an exacerbation, poor medication adherence, poor inhaler technique, or progression of COPD or comorbid conditions.  CAT MCID is 2 points  mMRC: mMRC (Modified Medical Research Council) Dyspnea Scale is used to assess the degree of baseline functional disability in patients of respiratory disease due to dyspnea. No minimal important difference is established. A decrease in score of 1 point or greater is considered a positive change.   Pulmonary Function Assessment:  Pulmonary Function Assessment - 11/16/22 1115       Breath   Shortness of Breath Fear of Shortness of Breath;Limiting activity             Exercise Target Goals: Exercise Program Goal: Individual exercise prescription set using results from initial 6 min walk test and THRR while considering  patient's activity barriers and safety.   Exercise Prescription Goal: Initial exercise prescription builds to 30-45 minutes a day of aerobic activity, 2-3 days per week.  Home exercise guidelines will be given to patient during program as part of exercise prescription that the participant will acknowledge.  Activity Barriers & Risk Stratification:  Activity Barriers & Cardiac Risk Stratification - 11/16/22 1114       Activity Barriers & Cardiac Risk Stratification   Activity Barriers  Deconditioning;Muscular Weakness;Shortness of Breath;Arthritis;Joint Problems;Neck/Spine Problems             6 Minute Walk:  6 Minute Walk     Row Name 11/16/22 1206         6 Minute Walk   Phase Initial     Distance 1190 feet     Walk Time 6 minutes     # of Rest Breaks 0     MPH 2.25     METS 1.96     RPE 13     Perceived Dyspnea  3  VO2 Peak 6.86     Symptoms Yes (comment)     Comments Pt c/o lightheadedness and shaky feeling 4 1/2 min in to walk test. Declined rest. Eventually resolved after 5 min rest. Sts she has had this feeling before when she gets SOB/exerted. (note:used forehead probe for SpO2)     Resting HR 85 bpm     Resting BP 146/64     Resting Oxygen Saturation  100 %     Exercise Oxygen Saturation  during 6 min walk 100 %     Max Ex. HR 101 bpm     Max Ex. BP 150/60     2 Minute Post BP 138/60       Interval HR   1 Minute HR 89     2 Minute HR 97     3 Minute HR 94     4 Minute HR 94     5 Minute HR 99     6 Minute HR 101     2 Minute Post HR 84     Interval Heart Rate? Yes       Interval Oxygen   Interval Oxygen? Yes     Baseline Oxygen Saturation % 100 %     1 Minute Oxygen Saturation % 100 %     1 Minute Liters of Oxygen 0 L     2 Minute Oxygen Saturation % 99 %     2 Minute Liters of Oxygen 0 L     3 Minute Oxygen Saturation % 100 %     3 Minute Liters of Oxygen 0 L     4 Minute Oxygen Saturation % 100 %     4 Minute Liters of Oxygen 0 L     5 Minute Oxygen Saturation % 100 %     5 Minute Liters of Oxygen 0 L     6 Minute Oxygen Saturation % 100 %     6 Minute Liters of Oxygen 0 L     2 Minute Post Oxygen Saturation % 100 %     2 Minute Post Liters of Oxygen 0 L              Oxygen Initial Assessment:  Oxygen Initial Assessment - 11/16/22 1115       Home Oxygen   Home Oxygen Device None    Sleep Oxygen Prescription None    Home Exercise Oxygen Prescription None    Home Resting Oxygen Prescription None      Initial  6 min Walk   Oxygen Used None      Program Oxygen Prescription   Program Oxygen Prescription None      Intervention   Short Term Goals To learn and understand importance of maintaining oxygen saturations>88%;To learn and understand importance of monitoring SPO2 with pulse oximeter and demonstrate accurate use of the pulse oximeter.;To learn and demonstrate proper pursed lip breathing techniques or other breathing techniques.     Long  Term Goals Maintenance of O2 saturations>88%;Verbalizes importance of monitoring SPO2 with pulse oximeter and return demonstration;Exhibits proper breathing techniques, such as pursed lip breathing or other method taught during program session             Oxygen Re-Evaluation:   Oxygen Discharge (Final Oxygen Re-Evaluation):   Initial Exercise Prescription:  Initial Exercise Prescription - 11/16/22 1200       Date of Initial Exercise RX and Referring Provider   Date 11/16/22    Referring Provider Custovic  Expected Discharge Date 11/16/22      Recumbant Bike   Level 1    RPM 70    Minutes 15      NuStep   Level 1    SPM 70    Minutes 15      Prescription Details   Frequency (times per week) 2    Duration Progress to 30 minutes of continuous aerobic without signs/symptoms of physical distress      Intensity   THRR 40-80% of Max Heartrate 53-106    Ratings of Perceived Exertion 11-13    Perceived Dyspnea 0-4      Progression   Progression Continue progressive overload as per policy without signs/symptoms or physical distress.      Resistance Training   Training Prescription Yes    Weight red bands    Reps 10-15             Perform Capillary Blood Glucose checks as needed.  Exercise Prescription Changes:   Exercise Comments:   Exercise Goals and Review:   Exercise Goals     Row Name 11/16/22 1216             Exercise Goals   Increase Physical Activity Yes       Intervention Provide advice, education,  support and counseling about physical activity/exercise needs.;Develop an individualized exercise prescription for aerobic and resistive training based on initial evaluation findings, risk stratification, comorbidities and participant's personal goals.       Expected Outcomes Short Term: Attend rehab on a regular basis to increase amount of physical activity.;Long Term: Exercising regularly at least 3-5 days a week.;Long Term: Add in home exercise to make exercise part of routine and to increase amount of physical activity.       Increase Strength and Stamina Yes       Intervention Provide advice, education, support and counseling about physical activity/exercise needs.;Develop an individualized exercise prescription for aerobic and resistive training based on initial evaluation findings, risk stratification, comorbidities and participant's personal goals.       Expected Outcomes Short Term: Increase workloads from initial exercise prescription for resistance, speed, and METs.;Short Term: Perform resistance training exercises routinely during rehab and add in resistance training at home;Long Term: Improve cardiorespiratory fitness, muscular endurance and strength as measured by increased METs and functional capacity (6MWT)       Able to understand and use rate of perceived exertion (RPE) scale Yes       Intervention Provide education and explanation on how to use RPE scale       Expected Outcomes Short Term: Able to use RPE daily in rehab to express subjective intensity level;Long Term:  Able to use RPE to guide intensity level when exercising independently       Able to understand and use Dyspnea scale Yes       Intervention Provide education and explanation on how to use Dyspnea scale       Expected Outcomes Short Term: Able to use Dyspnea scale daily in rehab to express subjective sense of shortness of breath during exertion;Long Term: Able to use Dyspnea scale to guide intensity level when exercising  independently       Knowledge and understanding of Target Heart Rate Range (THRR) Yes       Intervention Provide education and explanation of THRR including how the numbers were predicted and where they are located for reference       Expected Outcomes Short Term: Able to state/look up  THRR;Long Term: Able to use THRR to govern intensity when exercising independently;Short Term: Able to use daily as guideline for intensity in rehab       Understanding of Exercise Prescription Yes       Intervention Provide education, explanation, and written materials on patient's individual exercise prescription       Expected Outcomes Short Term: Able to explain program exercise prescription;Long Term: Able to explain home exercise prescription to exercise independently                Exercise Goals Re-Evaluation :   Discharge Exercise Prescription (Final Exercise Prescription Changes):   Nutrition:  Target Goals: Understanding of nutrition guidelines, daily intake of sodium <1554m, cholesterol <2060m calories 30% from fat and 7% or less from saturated fats, daily to have 5 or more servings of fruits and vegetables.  Biometrics:  Pre Biometrics - 11/16/22 1217       Pre Biometrics   Grip Strength 18 kg              Nutrition Therapy Plan and Nutrition Goals:   Nutrition Assessments:  MEDIFICTS Score Key: ?70 Need to make dietary changes  40-70 Heart Healthy Diet ? 40 Therapeutic Level Cholesterol Diet   Picture Your Plate Scores: <4<92nhealthy dietary pattern with much room for improvement. 41-50 Dietary pattern unlikely to meet recommendations for good health and room for improvement. 51-60 More healthful dietary pattern, with some room for improvement.  >60 Healthy dietary pattern, although there may be some specific behaviors that could be improved.    Nutrition Goals Re-Evaluation:   Nutrition Goals Discharge (Final Nutrition Goals  Re-Evaluation):   Psychosocial: Target Goals: Acknowledge presence or absence of significant depression and/or stress, maximize coping skills, provide positive support system. Participant is able to verbalize types and ability to use techniques and skills needed for reducing stress and depression.  Initial Review & Psychosocial Screening:  Initial Psych Review & Screening - 11/16/22 1108       Initial Review   Current issues with None Identified      Family Dynamics   Good Support System? Yes    Comments Pt states she and dgt go to lunch and shopping almost everyday      Barriers   Psychosocial barriers to participate in program There are no identifiable barriers or psychosocial needs.      Screening Interventions   Interventions Encouraged to exercise    Expected Outcomes Short Term goal: Utilizing psychosocial counselor, staff and physician to assist with identification of specific Stressors or current issues interfering with healing process. Setting desired goal for each stressor or current issue identified.;Long Term Goal: Stressors or current issues are controlled or eliminated.;Short Term goal: Identification and review with participant of any Quality of Life or Depression concerns found by scoring the questionnaire.;Long Term goal: The participant improves quality of Life and PHQ9 Scores as seen by post scores and/or verbalization of changes             Quality of Life Scores:  Scores of 19 and below usually indicate a poorer quality of life in these areas.  A difference of  2-3 points is a clinically meaningful difference.  A difference of 2-3 points in the total score of the Quality of Life Index has been associated with significant improvement in overall quality of life, self-image, physical symptoms, and general health in studies assessing change in quality of life.  PHQ-9: Review Flowsheet       11/16/2022  Depression screen PHQ 2/9  Decreased Interest 0  Down,  Depressed, Hopeless 0  PHQ - 2 Score 0  Altered sleeping 0  Tired, decreased energy 0  Change in appetite 0  Feeling bad or failure about yourself  0  Trouble concentrating 0  Moving slowly or fidgety/restless 0  Suicidal thoughts 0  PHQ-9 Score 0   Interpretation of Total Score  Total Score Depression Severity:  1-4 = Minimal depression, 5-9 = Mild depression, 10-14 = Moderate depression, 15-19 = Moderately severe depression, 20-27 = Severe depression   Psychosocial Evaluation and Intervention:  Psychosocial Evaluation - 11/16/22 1110       Psychosocial Evaluation & Interventions   Interventions Encouraged to exercise with the program and follow exercise prescription    Comments Pt denies any psychosocial barriers to the PR program    Expected Outcomes Pt will exercise and benefit from PR    Continue Psychosocial Services  No Follow up required             Psychosocial Re-Evaluation:   Psychosocial Discharge (Final Psychosocial Re-Evaluation):   Education: Education Goals: Education classes will be provided on a weekly basis, covering required topics. Participant will state understanding/return demonstration of topics presented.  Learning Barriers/Preferences:  Learning Barriers/Preferences - 11/16/22 1111       Learning Barriers/Preferences   Learning Barriers Hearing    Learning Preferences Verbal Instruction;Group Instruction;Individual Instruction             Education Topics: Introduction to Pulmonary Rehab Group instruction provided by PowerPoint, verbal discussion, and written material to support subject matter. Instructor reviews what Pulmonary Rehab is, the purpose of the program, and how patients are referred.     Know Your Numbers Group instruction that is supported by a PowerPoint presentation. Instructor discusses importance of knowing and understanding resting, exercise, and post-exercise oxygen saturation, heart rate, and blood pressure.  Oxygen saturation, heart rate, blood pressure, rating of perceived exertion, and dyspnea are reviewed along with a normal range for these values.    Exercise for the Pulmonary Patient Group instruction that is supported by a PowerPoint presentation. Instructor discusses benefits of exercise, core components of exercise, frequency, duration, and intensity of an exercise routine, importance of utilizing pulse oximetry during exercise, safety while exercising, and options of places to exercise outside of rehab.       MET Level  Group instruction provided by PowerPoint, verbal discussion, and written material to support subject matter. Instructor reviews what METs are and how to increase METs.    Pulmonary Medications Verbally interactive group education provided by instructor with focus on inhaled medications and proper administration.   Anatomy and Physiology of the Respiratory System Group instruction provided by PowerPoint, verbal discussion, and written material to support subject matter. Instructor reviews respiratory cycle and anatomical components of the respiratory system and their functions. Instructor also reviews differences in obstructive and restrictive respiratory diseases with examples of each.    Oxygen Safety Group instruction provided by PowerPoint, verbal discussion, and written material to support subject matter. There is an overview of "What is Oxygen" and "Why do we need it".  Instructor also reviews how to create a safe environment for oxygen use, the importance of using oxygen as prescribed, and the risks of noncompliance. There is a brief discussion on traveling with oxygen and resources the patient may utilize.   Oxygen Use Group instruction provided by PowerPoint, verbal discussion, and written material to discuss how supplemental oxygen is prescribed and different  types of oxygen supply systems. Resources for more information are provided.    Breathing  Techniques Group instruction that is supported by demonstration and informational handouts. Instructor discusses the benefits of pursed lip and diaphragmatic breathing and detailed demonstration on how to perform both.     Risk Factor Reduction Group instruction that is supported by a PowerPoint presentation. Instructor discusses the definition of a risk factor, different risk factors for pulmonary disease, and how the heart and lungs work together.   MD Day A group question and answer session with a medical doctor that allows participants to ask questions that relate to their pulmonary disease state.   Nutrition for the Pulmonary Patient Group instruction provided by PowerPoint slides, verbal discussion, and written materials to support subject matter. The instructor gives an explanation and review of healthy diet recommendations, which includes a discussion on weight management, recommendations for fruit and vegetable consumption, as well as protein, fluid, caffeine, fiber, sodium, sugar, and alcohol. Tips for eating when patients are short of breath are discussed.    Other Education Group or individual verbal, written, or video instructions that support the educational goals of the pulmonary rehab program.    Knowledge Questionnaire Score:  Knowledge Questionnaire Score - 11/16/22 1117       Knowledge Questionnaire Score   Pre Score 14/18             Core Components/Risk Factors/Patient Goals at Admission:  Personal Goals and Risk Factors at Admission - 11/16/22 1111       Core Components/Risk Factors/Patient Goals on Admission    Weight Management Weight Maintenance    Improve shortness of breath with ADL's Yes    Intervention Provide education, individualized exercise plan and daily activity instruction to help decrease symptoms of SOB with activities of daily living.    Expected Outcomes Short Term: Improve cardiorespiratory fitness to achieve a reduction of symptoms  when performing ADLs             Core Components/Risk Factors/Patient Goals Review:    Core Components/Risk Factors/Patient Goals at Discharge (Final Review):    ITP Comments:   Comments: Dr. Rodman Pickle is Medical Director for Pulmonary Rehab at [facility].

## 2022-11-20 ENCOUNTER — Ambulatory Visit (HOSPITAL_COMMUNITY): Payer: Medicare Other

## 2022-11-22 ENCOUNTER — Encounter (HOSPITAL_COMMUNITY)
Admission: RE | Admit: 2022-11-22 | Discharge: 2022-11-22 | Disposition: A | Discharge status: Home / Self Care | Payer: Medicare Other | Source: Ambulatory Visit | Attending: Internal Medicine | Admitting: Internal Medicine

## 2022-11-22 ENCOUNTER — Ambulatory Visit (HOSPITAL_COMMUNITY): Payer: Medicare Other

## 2022-11-22 DIAGNOSIS — J410 Simple chronic bronchitis: Secondary | ICD-10-CM

## 2022-11-22 NOTE — Progress Notes (Signed)
Daily Session Note  Patient Details  Name: Shannon Bowman MRN: [id number] Date of Birth: November 22, 1932 Referring Provider:   April Manson Pulmonary Rehab Walk Test from 11/16/2022 in [facility] for Heart, Vascular, & [place]  Referring Provider Custovic       Encounter Date: 11/22/2022  Check In:  Session Check In - 11/22/22 1430       Check-In   Supervising physician immediately available to respond to emergencies [facility] - Physician supervision    Physician(s) Dr. Tacy Learn    Location [facility]    Staff Present Elmon Else, MS, ACSM-CEP, Exercise Physiologist;Lisa Ysidro Evert, RN;Other;Randi Yevonne Pax, ACSM-CEP, Exercise Physiologist    Virtual Visit No    Medication changes reported     No    Fall or balance concerns reported    No    Tobacco Cessation No Change    Warm-up and Cool-down Performed as group-led instruction    Resistance Training Performed Yes    VAD Patient? No    PAD/SET Patient? No      Pain Assessment   Currently in Pain? No/denies             Capillary Blood Glucose: No results found for this or any previous visit (from the past 24 hour(s)).    Social History   Tobacco Use  Smoking Status Former   Packs/day: 0.10   Years: 3.00   Total pack years: 0.30   Types: Cigarettes   Quit date: 12/11/1963   Years since quitting: 58.9  Smokeless Tobacco Never    Goals Met:  Proper associated with RPD/PD & O2 Sat Exercise tolerated well No report of concerns or symptoms today Strength training completed today  Goals Unmet:  Not Applicable  Comments: Service time is from 1321 to 1443.    Dr. Rodman Pickle is Medical Director for Pulmonary Rehab at [facility].

## 2022-11-27 ENCOUNTER — Ambulatory Visit (HOSPITAL_COMMUNITY): Payer: Medicare Other

## 2022-11-27 ENCOUNTER — Encounter (HOSPITAL_COMMUNITY)
Admission: RE | Admit: 2022-11-27 | Discharge: 2022-11-27 | Disposition: A | Discharge status: Home / Self Care | Payer: Medicare Other | Source: Ambulatory Visit | Attending: Internal Medicine | Admitting: Internal Medicine

## 2022-11-27 DIAGNOSIS — J410 Simple chronic bronchitis: Secondary | ICD-10-CM

## 2022-11-28 NOTE — Progress Notes (Signed)
Pulmonary Individual Treatment Plan  Patient Details  Name: Shannon Bowman MRN: [id number] Date of Birth: 04-13-1932 Referring Provider:   April Manson Pulmonary Rehab Walk Test from 11/16/2022 in [facility] & [place]  Referring Provider Custovic       Initial Encounter Date:  Flowsheet Row Pulmonary Rehab Walk Test from 11/16/2022 in [facility], & [place]  Date 11/16/22       Visit Diagnosis: Simple chronic bronchitis ([place])  Patient's Home Medications on Admission:   Current Outpatient Medications:    acetaminophen (TYLENOL) 325 MG tablet, Take 650 mg by mouth every 6 (six) hours as needed for mild pain., Disp: , Rfl:    amLODipine (NORVASC) 5 MG tablet, TAKE 1 TABLET BY MOUTH AT BEDTIME, Disp: 90 tablet, Rfl: 1   ascorbic acid (VITAMIN C) 500 MG tablet, Take 500 mg by mouth daily., Disp: , Rfl:    aspirin EC 81 MG tablet, Take 81 mg by mouth daily. Swallow whole., Disp: , Rfl:    calcium carbonate (OS-CAL) 600 MG TABS, Take 600 mg by mouth 2 (two) times daily with a meal., Disp: , Rfl:    cetirizine (ZYRTEC) 10 MG tablet, Take 10 mg by mouth daily as needed for allergies. , Disp: , Rfl:    Cholecalciferol (VITAMIN D3) 25 MCG (1000 UT) CAPS, Take by mouth., Disp: , Rfl:    clopidogrel (PLAVIX) 75 MG tablet, TAKE 1 TABLET BY MOUTH DAILY (Patient not taking: Reported on 11/16/2022), Disp: 30 tablet, Rfl: 0   estradiol (ESTRACE) 0.1 MG/GM vaginal cream, Place 1 Applicatorful vaginally once a week., Disp: , Rfl:    ezetimibe (ZETIA) 10 MG tablet, Take 1 tablet (10 mg total) by mouth daily., Disp: , Rfl:    fluticasone-salmeterol (ADVAIR HFA) 115-21 MCG/ACT inhaler, Inhale 2 puffs into the lungs 2 (two) times daily., Disp: 1 each, Rfl: 1   losartan-hydrochlorothiazide (HYZAAR) 50-12.5 MG tablet, TAKE ONE TABLET BY MOUTH EVERY MORNING (Patient not taking: Reported on 11/16/2022), Disp: 90  tablet, Rfl: 1   Probiotic CAPS, Take 2 capsules by mouth daily., Disp: , Rfl:    Propylene Glycol (SYSTANE BALANCE OP), Apply 1 drop to eye at bedtime., Disp: , Rfl:    rosuvastatin (CRESTOR) 20 MG tablet, Take 1 tablet (20 mg total) by mouth daily., Disp: 90 tablet, Rfl: 1   rosuvastatin (CRESTOR) 40 MG tablet, TAKE 1 TABLET BY MOUTH AT BEDTIME (Patient not taking: Reported on 11/16/2022), Disp: 90 tablet, Rfl: 1   traMADol (ULTRAM) 50 MG tablet, Take 50 mg by mouth every 6 (six) hours as needed. (Patient not taking: Reported on 11/16/2022), Disp: , Rfl:   Past Medical History: Past Medical History:  Diagnosis Date   Arthritis    Cataract    right eye   Cataract, nuclear sclerotic, left eye 06/02/2020   The nature of cataract was discussed with the patient as well as the elective nature of surgery. The patient was reassured that surgery at a later date does not put the patient at risk for a worse outcome. It was emphasized that the need for surgery is dictated by the patient's quality of life as influenced by the cataract. Patient was instructed to maintain close follow up with their general eye    Chronic kidney disease    states Dr. Delfina Redwood told her CKD stage 3   GERD (gastroesophageal reflux disease)    History of hiatal hernia  Spinal stenosis    TIA (transient ischemic attack) 2023    Tobacco Use: Social History   Tobacco Use  Smoking Status Former   Packs/day: 0.10   Years: 3.00   Total pack years: 0.30   Types: Cigarettes   Quit date: 12/11/1963   Years since quitting: 59.0  Smokeless Tobacco Never    Labs: Review Flowsheet        No data to display          Capillary Blood Glucose: No results found for: "GLUCAP"   Pulmonary Assessment Scores:  Pulmonary Assessment Scores     Row Name 11/16/22 1112         ADL UCSD   SOB Score total 56       CAT Score   CAT Score 18       mMRC Score   mMRC Score 4             UCSD: Self-administered  rating of dyspnea associated with activities of daily living (ADLs) 6-point scale (0 = "not at all" to 5 = "maximal or unable to do because of breathlessness")  Scoring Scores range from 0 to 120.  Minimally important difference is 5 units  CAT: CAT can identify the health impairment of COPD patients and is better correlated with disease progression.  CAT has a scoring range of zero to 40. The CAT score is classified into four groups of low (less than 10), medium (10 - 20), high (21-30) and very high (31-40) based on the impact level of disease on health status. A CAT score over 10 suggests significant symptoms.  A worsening CAT score could be explained by an exacerbation, poor medication adherence, poor inhaler technique, or progression of COPD or comorbid conditions.  CAT MCID is 2 points  mMRC: mMRC (Modified Medical Research Council) Dyspnea Scale is used to assess the degree of baseline functional disability in patients of respiratory disease due to dyspnea. No minimal important difference is established. A decrease in score of 1 point or greater is considered a positive change.   Pulmonary Function Assessment:  Pulmonary Function Assessment - 11/16/22 1115       Breath   Shortness of Breath Fear of Shortness of Breath;Limiting activity             Exercise Target Goals: Exercise Program Goal: Individual exercise prescription set using results from initial 6 min walk test and THRR while considering  patient's activity barriers and safety.   Exercise Prescription Goal: Initial exercise prescription builds to 30-45 minutes a day of aerobic activity, 2-3 days per week.  Home exercise guidelines will be given to patient during program as part of exercise prescription that the participant will acknowledge.  Activity Barriers & Risk Stratification:  Activity Barriers & Cardiac Risk Stratification - 11/16/22 1114       Activity Barriers & Cardiac Risk Stratification   Activity  Barriers Deconditioning;Muscular Weakness;Shortness of Breath;Arthritis;Joint Problems;Neck/Spine Problems             6 Minute Walk:  6 Minute Walk     Row Name 11/16/22 1206         6 Minute Walk   Phase Initial     Distance 1190 feet     Walk Time 6 minutes     # of Rest Breaks 0     MPH 2.25     METS 1.96     RPE 13     Perceived Dyspnea  3  VO2 Peak 6.86     Symptoms Yes (comment)     Comments Pt c/o lightheadedness and shaky feeling 4 1/2 min in to walk test. Declined rest. Eventually resolved after 5 min rest. Sts she has had this feeling before when she gets SOB/exerted. (note:used forehead probe for SpO2)     Resting HR 85 bpm     Resting BP 146/64     Resting Oxygen Saturation  100 %     Exercise Oxygen Saturation  during 6 min walk 100 %     Max Ex. HR 101 bpm     Max Ex. BP 150/60     2 Minute Post BP 138/60       Interval HR   1 Minute HR 89     2 Minute HR 97     3 Minute HR 94     4 Minute HR 94     5 Minute HR 99     6 Minute HR 101     2 Minute Post HR 84     Interval Heart Rate? Yes       Interval Oxygen   Interval Oxygen? Yes     Baseline Oxygen Saturation % 100 %     1 Minute Oxygen Saturation % 100 %     1 Minute Liters of Oxygen 0 L     2 Minute Oxygen Saturation % 99 %     2 Minute Liters of Oxygen 0 L     3 Minute Oxygen Saturation % 100 %     3 Minute Liters of Oxygen 0 L     4 Minute Oxygen Saturation % 100 %     4 Minute Liters of Oxygen 0 L     5 Minute Oxygen Saturation % 100 %     5 Minute Liters of Oxygen 0 L     6 Minute Oxygen Saturation % 100 %     6 Minute Liters of Oxygen 0 L     2 Minute Post Oxygen Saturation % 100 %     2 Minute Post Liters of Oxygen 0 L              Oxygen Initial Assessment:  Oxygen Initial Assessment - 11/16/22 1115       Home Oxygen   Home Oxygen Device None    Sleep Oxygen Prescription None    Home Exercise Oxygen Prescription None    Home Resting Oxygen Prescription None       Initial 6 min Walk   Oxygen Used None      Program Oxygen Prescription   Program Oxygen Prescription None      Intervention   Short Term Goals To learn and understand importance of maintaining oxygen saturations>88%;To learn and understand importance of monitoring SPO2 with pulse oximeter and demonstrate accurate use of the pulse oximeter.;To learn and demonstrate proper pursed lip breathing techniques or other breathing techniques.     Long  Term Goals Maintenance of O2 saturations>88%;Verbalizes importance of monitoring SPO2 with pulse oximeter and return demonstration;Exhibits proper breathing techniques, such as pursed lip breathing or other method taught during program session             Oxygen Re-Evaluation:  Oxygen Re-Evaluation     Row Name 11/26/22 1449             Program Oxygen Prescription   Program Oxygen Prescription None         Home Oxygen  Home Oxygen Device None       Sleep Oxygen Prescription None       Home Exercise Oxygen Prescription None       Home Resting Oxygen Prescription None         Goals/Expected Outcomes   Short Term Goals To learn and understand importance of maintaining oxygen saturations>88%;To learn and understand importance of monitoring SPO2 with pulse oximeter and demonstrate accurate use of the pulse oximeter.;To learn and demonstrate proper pursed lip breathing techniques or other breathing techniques.        Long  Term Goals Maintenance of O2 saturations>88%;Verbalizes importance of monitoring SPO2 with pulse oximeter and return demonstration;Exhibits proper breathing techniques, such as pursed lip breathing or other method taught during program session       Goals/Expected Outcomes Compliance and understanding of oxygen saturation monitoring and breathing techniques to decrease shortness of breath. Compliance and understanding of oxygen saturation monitoring and breathing techniques to decrease shortness of breath.                 Oxygen Discharge (Final Oxygen Re-Evaluation):  Oxygen Re-Evaluation - 11/26/22 1449       Program Oxygen Prescription   Program Oxygen Prescription None      Home Oxygen   Home Oxygen Device None    Sleep Oxygen Prescription None    Home Exercise Oxygen Prescription None    Home Resting Oxygen Prescription None      Goals/Expected Outcomes   Short Term Goals To learn and understand importance of maintaining oxygen saturations>88%;To learn and understand importance of monitoring SPO2 with pulse oximeter and demonstrate accurate use of the pulse oximeter.;To learn and demonstrate proper pursed lip breathing techniques or other breathing techniques.     Long  Term Goals Maintenance of O2 saturations>88%;Verbalizes importance of monitoring SPO2 with pulse oximeter and return demonstration;Exhibits proper breathing techniques, such as pursed lip breathing or other method taught during program session    Goals/Expected Outcomes Compliance and understanding of oxygen saturation monitoring and breathing techniques to decrease shortness of breath. Compliance and understanding of oxygen saturation monitoring and breathing techniques to decrease shortness of breath.             Initial Exercise Prescription:  Initial Exercise Prescription - 11/16/22 1200       Date of Initial Exercise RX and Referring Provider   Date 11/16/22    Referring Provider Custovic    Expected Discharge Date 11/16/22      Recumbant Bike   Level 1    RPM 70    Minutes 15      NuStep   Level 1    SPM 70    Minutes 15      Prescription Details   Frequency (times per week) 2    Duration Progress to 30 minutes of continuous aerobic without signs/symptoms of physical distress      Intensity   THRR 40-80% of Max Heartrate 53-106    Ratings of Perceived Exertion 11-13    Perceived Dyspnea 0-4      Progression   Progression Continue progressive overload as per policy without signs/symptoms or physical  distress.      Resistance Training   Training Prescription Yes    Weight red bands    Reps 10-15             Perform Capillary Blood Glucose checks as needed.  Exercise Prescription Changes:   Exercise Comments:   Exercise Comments  [place] Name 11/22/22 1532           Exercise Comments Pt completed first day of exercise. Shannon Bowman exercised for 15 min on the recumbent bike and Nustep. She averaged 2.0 METs at level 1 on the recumbent bike and 2.0 METs at level 2 on the Nustep. She performed the warmup and cooldown standing without limitations. Discussed METs and how to increase METs                Exercise Goals and Review:   Exercise Goals     Row Name 11/16/22 1216 11/20/22 1558           Exercise Goals   Increase Physical Activity Yes Yes      Intervention Provide advice, education, support and counseling about physical activity/exercise needs.;Develop an individualized exercise prescription for aerobic and resistive training based on initial evaluation findings, risk stratification, comorbidities and participant's personal goals. Provide advice, education, support and counseling about physical activity/exercise needs.;Develop an individualized exercise prescription for aerobic and resistive training based on initial evaluation findings, risk stratification, comorbidities and participant's personal goals.      Expected Outcomes Short Term: Attend rehab on a regular basis to increase amount of physical activity.;Long Term: Exercising regularly at least 3-5 days a week.;Long Term: Add in home exercise to make exercise part of routine and to increase amount of physical activity. Short Term: Attend rehab on a regular basis to increase amount of physical activity.;Long Term: Exercising regularly at least 3-5 days a week.;Long Term: Add in home exercise to make exercise part of routine and to increase amount of physical activity.      Increase Strength and Stamina Yes Yes       Intervention Provide advice, education, support and counseling about physical activity/exercise needs.;Develop an individualized exercise prescription for aerobic and resistive training based on initial evaluation findings, risk stratification, comorbidities and participant's personal goals. Provide advice, education, support and counseling about physical activity/exercise needs.;Develop an individualized exercise prescription for aerobic and resistive training based on initial evaluation findings, risk stratification, comorbidities and participant's personal goals.      Expected Outcomes Short Term: Increase workloads from initial exercise prescription for resistance, speed, and METs.;Short Term: Perform resistance training exercises routinely during rehab and add in resistance training at home;Long Term: Improve cardiorespiratory fitness, muscular endurance and strength as measured by increased METs and functional capacity (6MWT) Short Term: Increase workloads from initial exercise prescription for resistance, speed, and METs.;Short Term: Perform resistance training exercises routinely during rehab and add in resistance training at home;Long Term: Improve cardiorespiratory fitness, muscular endurance and strength as measured by increased METs and functional capacity (6MWT)      Able to understand and use rate of perceived exertion (RPE) scale Yes Yes      Intervention Provide education and explanation on how to use RPE scale Provide education and explanation on how to use RPE scale      Expected Outcomes Short Term: Able to use RPE daily in rehab to express subjective intensity level;Long Term:  Able to use RPE to guide intensity level when exercising independently Short Term: Able to use RPE daily in rehab to express subjective intensity level;Long Term:  Able to use RPE to guide intensity level when exercising independently      Able to understand and use Dyspnea scale Yes Yes      Intervention Provide  education and explanation on how to use Dyspnea scale Provide education and explanation on how to use Dyspnea  scale      Expected Outcomes Short Term: Able to use Dyspnea scale daily in rehab to express subjective sense of shortness of breath during exertion;Long Term: Able to use Dyspnea scale to guide intensity level when exercising independently Short Term: Able to use Dyspnea scale daily in rehab to express subjective sense of shortness of breath during exertion;Long Term: Able to use Dyspnea scale to guide intensity level when exercising independently      Knowledge and understanding of Target Heart Rate Range (THRR) Yes Yes      Intervention Provide education and explanation of THRR including how the numbers were predicted and where they are located for reference Provide education and explanation of THRR including how the numbers were predicted and where they are located for reference      Expected Outcomes Short Term: Able to state/look up THRR;Long Term: Able to use THRR to govern intensity when exercising independently;Short Term: Able to use daily as guideline for intensity in rehab Short Term: Able to state/look up THRR;Long Term: Able to use THRR to govern intensity when exercising independently;Short Term: Able to use daily as guideline for intensity in rehab      Understanding of Exercise Prescription Yes Yes      Intervention Provide education, explanation, and written materials on patient's individual exercise prescription Provide education, explanation, and written materials on patient's individual exercise prescription      Expected Outcomes Short Term: Able to explain program exercise prescription;Long Term: Able to explain home exercise prescription to exercise independently Short Term: Able to explain program exercise prescription;Long Term: Able to explain home exercise prescription to exercise independently               Exercise Goals Re-Evaluation :  Exercise Goals  Re-Evaluation     Shannon Bowman Name 11/26/22 1448             Exercise Goal Re-Evaluation   Exercise Goals Review Increase Physical Activity;Able to understand and use Dyspnea scale;Understanding of Exercise Prescription;Increase Strength and Stamina;Knowledge and understanding of Target Heart Rate Range (THRR);Able to understand and use rate of perceived exertion (RPE) scale       Comments Pt has had one exercise session, which she tolerated well, averaging 2.0 METs on the recumbent bike and Nustep. It is too early to discern any progressions. Will continue to monitor.       Expected Outcomes Through exercise at rehab and home, the patient will decrease shortness of breath with daily activities and feel confident in carrying out an exercise regimen at home.                Discharge Exercise Prescription (Final Exercise Prescription Changes):   Nutrition:  Target Goals: Understanding of nutrition guidelines, daily intake of sodium <1577m, cholesterol <2052m calories 30% from fat and 7% or less from saturated fats, daily to have 5 or more servings of fruits and vegetables.  Biometrics:  Pre Biometrics - 11/16/22 1217       Pre Biometrics   Grip Strength 18 kg              Nutrition Therapy Plan and Nutrition Goals:  Nutrition Therapy & Goals - 11/22/22 1549       Nutrition Therapy   Diet Heart Healthy diet    Drug/Food Interactions Statins/Certain Fruits      Personal Nutrition Goals   Nutrition Goal Patient to continue to use the plate method as a daily guide for meal planning to include lean protein/plant  protein, fruits, vegetables whole grains, low fat dairy as part of well balanced diet.    Personal Goal #2 --    Comments Shannon Bowman lives alone as her husband passed away 1.5 years ago. She reports no nutrition concerns at this time. She reports eating a wide variety of foods and eating 2-3 meals daily. Her daughters are very supportive and provide many meals for her. She  continues regular follow-up with cardiology and does weigh daily. Her losartan/hctz was stopped on 08/14/22 due to low sodium concerns.             Nutrition Assessments:  Nutrition Assessments - 11/22/22 1601       Rate Your Plate Scores   Pre Score 57            MEDIFICTS Score Key: ?70 Need to make dietary changes  40-70 Heart Healthy Diet ? 40 Therapeutic Level Cholesterol Diet  Flowsheet Row PULMONARY REHAB OTHER RESPIRATORY from 11/22/2022 in [facility]  Picture Your Plate Total Score on Admission 57      Picture Your Plate Scores: <37 Unhealthy dietary pattern with much room for improvement. 41-50 Dietary pattern unlikely to meet recommendations for good health and room for improvement. 51-60 More healthful dietary pattern, with some room for improvement.  >60 Healthy dietary pattern, although there may be some specific behaviors that could be improved.    Nutrition Goals Re-Evaluation:  Nutrition Goals Re-Evaluation     Shannon Bowman Name 11/22/22 1549             Goals   Current Weight 130 lb 15.3 oz (59.4 kg)       Comment Lipids WNL, LDL 118       Expected Outcome Shannon Bowman lives alone as her husband passed away 1.5 years ago. She reports no nutrition concerns at this time. She reports eating a wide variety of foods, eating 2-3 meals daily, and no changes to appetite. Her daughters are very supportive and provide many meals for her. She continues regular follow-up with cardiology and does weigh daily. Her losartan/hctz was stopped on 08/14/22 due to low sodium concerns. Patient will benefit from continued attendance to pulmonary rehab.                Nutrition Goals Discharge (Final Nutrition Goals Re-Evaluation):  Nutrition Goals Re-Evaluation - 11/22/22 1549       Goals   Current Weight 130 lb 15.3 oz (59.4 kg)    Comment Lipids WNL, LDL 118    Expected Outcome Shannon Bowman lives alone as her husband  passed away 1.5 years ago. She reports no nutrition concerns at this time. She reports eating a wide variety of foods, eating 2-3 meals daily, and no changes to appetite. Her daughters are very supportive and provide many meals for her. She continues regular follow-up with cardiology and does weigh daily. Her losartan/hctz was stopped on 08/14/22 due to low sodium concerns. Patient will benefit from continued attendance to pulmonary rehab.             Psychosocial: Target Goals: Acknowledge presence or absence of significant depression and/or stress, maximize coping skills, provide positive support system. Participant is able to verbalize types and ability to use techniques and skills needed for reducing stress and depression.  Initial Review & Psychosocial Screening:  Initial Psych Review & Screening - 11/16/22 1108       Initial Review   Current issues with None Identified  Family Dynamics   Good Support System? Yes    Comments Pt states she and dgt go to lunch and shopping almost everyday      Barriers   Psychosocial barriers to participate in program There are no identifiable barriers or psychosocial needs.      Screening Interventions   Interventions Encouraged to exercise    Expected Outcomes Short Term goal: Utilizing psychosocial counselor, staff and physician to assist with identification of specific Stressors or current issues interfering with healing process. Setting desired goal for each stressor or current issue identified.;Long Term Goal: Stressors or current issues are controlled or eliminated.;Short Term goal: Identification and review with participant of any Quality of Life or Depression concerns found by scoring the questionnaire.;Long Term goal: The participant improves quality of Life and PHQ9 Scores as seen by post scores and/or verbalization of changes             Quality of Life Scores:  Scores of 19 and below usually indicate a poorer quality of life in  these areas.  A difference of  2-3 points is a clinically meaningful difference.  A difference of 2-3 points in the total score of the Quality of Life Index has been associated with significant improvement in overall quality of life, self-image, physical symptoms, and general health in studies assessing change in quality of life.  PHQ-9: Review Flowsheet       11/16/2022  Depression screen PHQ 2/9  Decreased Interest 0  Down, Depressed, Hopeless 0  PHQ - 2 Score 0  Altered sleeping 0  Tired, decreased energy 0  Change in appetite 0  Feeling bad or failure about yourself  0  Trouble concentrating 0  Moving slowly or fidgety/restless 0  Suicidal thoughts 0  PHQ-9 Score 0   Interpretation of Total Score  Total Score Depression Severity:  1-4 = Minimal depression, 5-9 = Mild depression, 10-14 = Moderate depression, 15-19 = Moderately severe depression, 20-27 = Severe depression   Psychosocial Evaluation and Intervention:  Psychosocial Evaluation - 11/16/22 1110       Psychosocial Evaluation & Interventions   Interventions Encouraged to exercise with the program and follow exercise prescription    Comments Pt denies any psychosocial barriers to the PR program    Expected Outcomes Pt will exercise and benefit from [place]  No Follow up required             Psychosocial Re-Evaluation:  Psychosocial Re-Evaluation     Shannon Bowman Name 11/22/22 1631             Psychosocial Re-Evaluation   Current issues with None Identified       Comments Shannon Bowman started in the [place] program today. we will continue to moniotr for any psychosocial barriers.       Expected Outcomes For her to participate in the program without any psychosocial issues or concerns.       Continue Psychosocial Services  No Follow up required                Psychosocial Discharge (Final Psychosocial Re-Evaluation):  Psychosocial Re-Evaluation - 11/22/22 1631       Psychosocial  Re-Evaluation   Current issues with None Identified    Comments Shannon Bowman started in the [place] program today. we will continue to moniotr for any psychosocial barriers.    Expected Outcomes For her to participate in the program without any psychosocial issues or concerns.    Continue Psychosocial Services  No Follow up required             Education: Education Goals: Education classes will be provided on a weekly basis, covering required topics. Participant will state understanding/return demonstration of topics presented.  Learning Barriers/Preferences:  Learning Barriers/Preferences - 11/16/22 1111       Learning Barriers/Preferences   Learning Barriers Hearing    Learning Preferences Verbal Instruction;Group Instruction;Individual Instruction             Education Topics: Introduction to Pulmonary Rehab Group instruction provided by PowerPoint, verbal discussion, and written material to support subject matter. Instructor reviews what Pulmonary Rehab is, the purpose of the program, and how patients are referred.     Know Your Numbers Group instruction that is supported by a PowerPoint presentation. Instructor discusses importance of knowing and understanding resting, exercise, and post-exercise oxygen saturation, heart rate, and blood pressure. Oxygen saturation, heart rate, blood pressure, rating of perceived exertion, and dyspnea are reviewed along with a normal range for these values.    Exercise for the Pulmonary Patient Group instruction that is supported by a PowerPoint presentation. Instructor discusses benefits of exercise, core components of exercise, frequency, duration, and intensity of an exercise routine, importance of utilizing pulse oximetry during exercise, safety while exercising, and options of places to exercise outside of rehab.       MET Level  Group instruction provided by PowerPoint, verbal discussion, and written material to support subject matter.  Instructor reviews what METs are and how to increase METs.    Pulmonary Medications Verbally interactive group education provided by instructor with focus on inhaled medications and proper administration.   Anatomy and Physiology of the Respiratory System Group instruction provided by PowerPoint, verbal discussion, and written material to support subject matter. Instructor reviews respiratory cycle and anatomical components of the respiratory system and their functions. Instructor also reviews differences in obstructive and restrictive respiratory diseases with examples of each.    Oxygen Safety Group instruction provided by PowerPoint, verbal discussion, and written material to support subject matter. There is an overview of "What is Oxygen" and "Why do we need it".  Instructor also reviews how to create a safe environment for oxygen use, the importance of using oxygen as prescribed, and the risks of noncompliance. There is a brief discussion on traveling with oxygen and resources the patient may utilize.   Oxygen Use Group instruction provided by PowerPoint, verbal discussion, and written material to discuss how supplemental oxygen is prescribed and different types of oxygen supply systems. Resources for more information are provided.    Breathing Techniques Group instruction that is supported by demonstration and informational handouts. Instructor discusses the benefits of pursed lip and diaphragmatic breathing and detailed demonstration on how to perform both.     Risk Factor Reduction Group instruction that is supported by a PowerPoint presentation. Instructor discusses the definition of a risk factor, different risk factors for pulmonary disease, and how the heart and lungs work together. Flowsheet Row PULMONARY REHAB OTHER RESPIRATORY from 11/22/2022 in [facility] for Heart, Vascular, & [place]  Date 11/22/22  Educator EP  Instruction Review Code 1-  Verbalizes Understanding       MD Day A group question and answer session with a medical doctor that allows participants to ask questions that relate to their pulmonary disease state.   Nutrition for the Pulmonary Patient Group instruction provided by PowerPoint slides, verbal discussion, and written materials to support subject matter. The instructor gives  an explanation and review of healthy diet recommendations, which includes a discussion on weight management, recommendations for fruit and vegetable consumption, as well as protein, fluid, caffeine, fiber, sodium, sugar, and alcohol. Tips for eating when patients are short of breath are discussed.    Other Education Group or individual verbal, written, or video instructions that support the educational goals of the pulmonary rehab program.    Knowledge Questionnaire Score:  Knowledge Questionnaire Score - 11/16/22 1117       Knowledge Questionnaire Score   Pre Score 14/18             Core Components/Risk Factors/Patient Goals at Admission:  Personal Goals and Risk Factors at Admission - 11/16/22 1111       Core Components/Risk Factors/Patient Goals on Admission    Weight Management Weight Maintenance    Improve shortness of breath with ADL's Yes    Intervention Provide education, individualized exercise plan and daily activity instruction to help decrease symptoms of SOB with activities of daily living.    Expected Outcomes Short Term: Improve cardiorespiratory fitness to achieve a reduction of symptoms when performing ADLs             Core Components/Risk Factors/Patient Goals Review:   Goals and Risk Factor Review     Row Name 11/27/22 0858             Core Components/Risk Factors/Patient Goals Review   Personal Goals Review Weight Management/Obesity;Develop more efficient breathing techniques such as purse lipped breathing and diaphragmatic breathing and practicing self-pacing with activity.;Improve  shortness of breath with ADL's;Increase knowledge of respiratory medications and ability to use respiratory devices properly.       Review Demmi has completed 1 exercise session. It is too soon to note any progresssions. Will continue to monitor.       Expected Outcomes See admission goals.                Core Components/Risk Factors/Patient Goals at Discharge (Final Review):   Goals and Risk Factor Review - 11/27/22 0858       Core Components/Risk Factors/Patient Goals Review   Personal Goals Review Weight Management/Obesity;Develop more efficient breathing techniques such as purse lipped breathing and diaphragmatic breathing and practicing self-pacing with activity.;Improve shortness of breath with ADL's;Increase knowledge of respiratory medications and ability to use respiratory devices properly.    Review Lesbia has completed 1 exercise session. It is too soon to note any progresssions. Will continue to monitor.    Expected Outcomes See admission goals.             ITP Comments: ITP REVIEW Pt is making expected progress toward pulmonary rehab goals after completing 2 sessions. Recommend continued exercise, life style modification, education, and utilization of breathing techniques to increase stamina and strength and decrease shortness of breath with exertion.    Comments: Dr. Rodman Pickle is Medical Director for Pulmonary Rehab at [facility].

## 2022-11-29 ENCOUNTER — Encounter (HOSPITAL_COMMUNITY)
Admission: RE | Admit: 2022-11-29 | Discharge: 2022-11-29 | Disposition: A | Discharge status: Home / Self Care | Payer: Medicare Other | Source: Ambulatory Visit | Attending: Internal Medicine | Admitting: Internal Medicine

## 2022-11-29 ENCOUNTER — Ambulatory Visit (HOSPITAL_COMMUNITY): Payer: Medicare Other

## 2022-11-29 DIAGNOSIS — J410 Simple chronic bronchitis: Secondary | ICD-10-CM | POA: Diagnosis not present

## 2022-11-29 DIAGNOSIS — J40 Bronchitis, not specified as acute or chronic: Secondary | ICD-10-CM | POA: Diagnosis not present

## 2022-11-29 NOTE — Progress Notes (Signed)
Daily Session Note  Patient Details  Name: Shannon Bowman MRN: [id number] Date of Birth: 04-09-1932 Referring Provider:   April Manson Pulmonary Rehab Walk Test from 11/16/2022 in [facility] for Heart, Vascular, & [place]  Referring Provider Custovic       Encounter Date: 11/29/2022  Check In:  Session Check In - 11/29/22 1429       Check-In   Supervising physician immediately available to respond to emergencies [facility] - Physician supervision    Physician(s) Dr. Gala Murdoch    Location [facility]    Staff Present Elmon Else, MS, ACSM-CEP, Exercise Physiologist;Other;Lisa Ysidro Evert, RN    Virtual Visit No    Medication changes reported     No    Warm-up and Cool-down Performed as group-led instruction    Resistance Training Performed Yes    VAD Patient? No    PAD/SET Patient? No      Pain Assessment   Currently in Pain? No/denies    Multiple Pain Sites No             Capillary Blood Glucose: No results found for this or any previous visit (from the past 24 hour(s)).    Social History   Tobacco Use  Smoking Status Former   Packs/day: 0.10   Years: 3.00   Total pack years: 0.30   Types: Cigarettes   Quit date: 12/11/1963   Years since quitting: 59.0  Smokeless Tobacco Never    Goals Met:  Exercise tolerated well No report of concerns or symptoms today Strength training completed today  Goals Unmet:  Not Applicable  Comments: Service time is from 1313 to 1439    Dr. Rodman Pickle is Medical Director for Pulmonary Rehab at [facility].

## 2022-12-04 ENCOUNTER — Encounter (HOSPITAL_COMMUNITY)
Admission: RE | Admit: 2022-12-04 | Discharge: 2022-12-04 | Disposition: A | Discharge status: Home / Self Care | Payer: Medicare Other | Source: Ambulatory Visit | Attending: Internal Medicine | Admitting: Internal Medicine

## 2022-12-04 ENCOUNTER — Ambulatory Visit (HOSPITAL_COMMUNITY): Payer: Medicare Other

## 2022-12-04 DIAGNOSIS — J410 Simple chronic bronchitis: Secondary | ICD-10-CM | POA: Diagnosis not present

## 2022-12-04 DIAGNOSIS — J40 Bronchitis, not specified as acute or chronic: Secondary | ICD-10-CM | POA: Diagnosis not present

## 2022-12-04 NOTE — Progress Notes (Signed)
Daily Session Note  Patient Details  Name: PSALM ARMAN MRN: [id number] Date of Birth: 04-23-1932 Referring Provider:   April Manson Pulmonary Rehab Walk Test from 11/16/2022 in [facility] for Heart, Vascular, & [place]  Referring Provider Custovic       Encounter Date: 12/04/2022  Check In:  Session Check In - 12/04/22 [place]       Check-In   Supervising physician immediately available to respond to emergencies [facility] - Physician supervision    Physician(s) Dr. Erskine Emery    Location [facility]    Staff Present Josephina Shih BS, ACSM-CEP, Exercise Physiologist;Derra Shartzer Ysidro Evert, RN;Johnny Starleen Blue, MS, Exercise Physiologist    Virtual Visit No    Medication changes reported     No    Fall or balance concerns reported    No    Tobacco Cessation No Change    Warm-up and Cool-down Performed as group-led instruction    Resistance Training Performed Yes    VAD Patient? No    PAD/SET Patient? No      Pain Assessment   Currently in Pain? No/denies    Multiple Pain Sites No             Capillary Blood Glucose: No results found for this or any previous visit (from the past 24 hour(s)).    Social History   Tobacco Use  Smoking Status Former   Packs/day: 0.10   Years: 3.00   Total pack years: 0.30   Types: Cigarettes   Quit date: 12/11/1963   Years since quitting: 59.0  Smokeless Tobacco Never    Goals Met:  Proper associated with RPD/PD & O2 Sat Exercise tolerated well No report of concerns or symptoms today Strength training completed today  Goals Unmet:  Not Applicable  Comments: Service time is from 1328 to 1500    Dr. Rodman Pickle is Medical Director for Pulmonary Rehab at [facility].

## 2022-12-06 ENCOUNTER — Encounter (HOSPITAL_COMMUNITY)
Admission: RE | Admit: 2022-12-06 | Discharge: 2022-12-06 | Disposition: A | Discharge status: Home / Self Care | Payer: Medicare Other | Source: Ambulatory Visit | Attending: Internal Medicine | Admitting: Internal Medicine

## 2022-12-06 ENCOUNTER — Ambulatory Visit (HOSPITAL_COMMUNITY): Payer: Medicare Other

## 2022-12-06 DIAGNOSIS — J40 Bronchitis, not specified as acute or chronic: Secondary | ICD-10-CM | POA: Diagnosis not present

## 2022-12-06 DIAGNOSIS — J410 Simple chronic bronchitis: Secondary | ICD-10-CM

## 2022-12-06 NOTE — Progress Notes (Signed)
Daily Session Note  Patient Details  Name: Shannon Bowman MRN: [id number] Date of Birth: [birth year] Referring Provider:   April Manson Pulmonary Rehab Walk Test from 11/16/2022 in [facility] for Heart, Vascular, & [place]  Referring Provider Custovic       Encounter Date: 12/06/2022  Check In:  Session Check In - 12/06/22 1710       Check-In   Supervising physician immediately available to respond to emergencies [facility] - Physician supervision    Physician(s) Erskine Emery    Location [facility]    Staff Present Rodney Langton, RN;Carlette Wilber Oliphant, RN, BSN;Manpreet Strey BS, ACSM-CEP, Exercise Physiologist;Jetta Walker BS, ACSM-CEP, Exercise Physiologist    Virtual Visit No    Medication changes reported     No    Fall or balance concerns reported    No    Tobacco Cessation No Change    Warm-up and Cool-down Performed as group-led instruction    Resistance Training Performed Yes    VAD Patient? No    PAD/SET Patient? No      Pain Assessment   Currently in Pain? No/denies             Capillary Blood Glucose: No results found for this or any previous visit (from the past 24 hour(s)).    Social History   Tobacco Use  Smoking Status Former   Packs/day: 0.10   Years: 3.00   Total pack years: 0.30   Types: Cigarettes   Quit date: 12/11/1963   Years since quitting: 59.0  Smokeless Tobacco Never    Goals Met:  Independence with exercise equipment Exercise tolerated well No report of concerns or symptoms today Strength training completed today  Goals Unmet:  Not Applicable  Comments: Pt took handout for education but declined staying for discussion. Service time is from 1329 to 1455.    Dr. Rodman Pickle is Medical Director for Pulmonary Rehab at [facility].

## 2022-12-11 ENCOUNTER — Encounter (HOSPITAL_COMMUNITY)
Admission: RE | Admit: 2022-12-11 | Discharge: 2022-12-11 | Disposition: A | Discharge status: Home / Self Care | Payer: Medicare Other | Source: Ambulatory Visit | Attending: Internal Medicine | Admitting: Internal Medicine

## 2022-12-11 ENCOUNTER — Ambulatory Visit (HOSPITAL_COMMUNITY): Payer: Medicare Other

## 2022-12-11 DIAGNOSIS — J410 Simple chronic bronchitis: Secondary | ICD-10-CM | POA: Diagnosis not present

## 2022-12-11 NOTE — Progress Notes (Signed)
Daily Session Note  Patient Details  Name: Shannon Bowman MRN: [id number] Date of Birth: Jul 24, 1932 Referring Provider:   April Manson Pulmonary Rehab Walk Test from 11/16/2022 in [facility] for Heart, Vascular, & [place]  Referring Provider Custovic       Encounter Date: 12/11/2022  Check In:  Session Check In - 12/11/22 1536       Check-In   Supervising physician immediately available to respond to emergencies [facility] - Physician supervision    Physician(s) Dr. Silas [place]    Location [facility]    Staff Present Elmon Else, MS, ACSM-CEP, Exercise Physiologist;Lisa Ysidro Evert, RN;Amenah Tucci Margette Fast, RN, BSN;Randi Reeve BS, ACSM-CEP, Exercise Physiologist    Virtual Visit No    Medication changes reported     No    Fall or balance concerns reported    No    Tobacco Cessation No Change    Warm-up and Cool-down Performed as group-led instruction    Resistance Training Performed Yes    VAD Patient? No    PAD/SET Patient? No      Pain Assessment   Currently in Pain? No/denies    Pain Score 0-No pain    Multiple Pain Sites No             Capillary Blood Glucose: No results found for this or any previous visit (from the past 24 hour(s)).   Exercise Prescription Changes - 12/11/22 1500       Response to Exercise   Blood Pressure (Admit) 104/62    Blood Pressure (Exercise) 140/68    Blood Pressure (Exit) 118/60    Heart Rate (Admit) 83 bpm    Heart Rate (Exercise) 97 bpm    Heart Rate (Exit) 80 bpm    Oxygen Saturation (Admit) 96 %    Oxygen Saturation (Exercise) 97 %    Oxygen Saturation (Exit) 97 %    Rating of Perceived Exertion (Exercise) 13    Perceived Dyspnea (Exercise) 1.5    Duration Continue with 30 min of aerobic exercise without signs/symptoms of physical distress.    Intensity THRR unchanged      Progression   Progression Continue to progress workloads to maintain intensity without signs/symptoms of physical  distress.      Resistance Training   Training Prescription Yes    Weight Red Bands    Reps 10-15    Time 10 Minutes      Recumbant Bike   Level 1    RPM 70    Minutes 15    METs 2      NuStep   Level 4    SPM 70    Minutes 15    METs 2             Social History   Tobacco Use  Smoking Status Former   Packs/day: 0.10   Years: 3.00   Total pack years: 0.30   Types: Cigarettes   Quit date: 12/11/1963   Years since quitting: 59.0  Smokeless Tobacco Never    Goals Met:  Exercise tolerated well No report of concerns or symptoms today Strength training completed today  Goals Unmet:  Not Applicable  Comments: Service time is from 1305 to 1455    Dr. Rodman Pickle is Medical Director for Pulmonary Rehab at [facility]

## 2022-12-13 ENCOUNTER — Encounter (HOSPITAL_COMMUNITY)
Admission: RE | Admit: 2022-12-13 | Discharge: 2022-12-13 | Disposition: A | Discharge status: Home / Self Care | Payer: Medicare Other | Source: Ambulatory Visit | Attending: Internal Medicine | Admitting: Internal Medicine

## 2022-12-13 ENCOUNTER — Ambulatory Visit (HOSPITAL_COMMUNITY): Payer: Medicare Other

## 2022-12-13 DIAGNOSIS — J410 Simple chronic bronchitis: Secondary | ICD-10-CM | POA: Diagnosis not present

## 2022-12-13 NOTE — Progress Notes (Signed)
Daily Session Note  Patient Details  Name: Shannon Bowman MRN: [id number] Date of Birth: 1932/08/02 Referring Provider:   April Manson Pulmonary Rehab Walk Test from 11/16/2022 in [facility] for Heart, Vascular, & [place]  Referring Provider Custovic       Encounter Date: 12/13/2022  Check In:  Session Check In - 12/13/22 1520       Check-In   Supervising physician immediately available to respond to emergencies [facility] - Physician supervision    Physician(s) Dr. Silas [place]    Location [facility]    Staff Present Elmon Else, MS, ACSM-CEP, Exercise Physiologist;Lisa Ysidro Evert, RN;Ireoluwa Gorsline Margette Fast, RN, BSN;Randi Reeve BS, ACSM-CEP, Exercise Physiologist    Virtual Visit No    Medication changes reported     No    Fall or balance concerns reported    No    Tobacco Cessation No Change    Warm-up and Cool-down Performed as group-led instruction    Resistance Training Performed Yes    VAD Patient? No    PAD/SET Patient? No      Pain Assessment   Currently in Pain? No/denies    Pain Score 0-No pain    Multiple Pain Sites No             Capillary Blood Glucose: No results found for this or any previous visit (from the past 24 hour(s)).    Social History   Tobacco Use  Smoking Status Former   Packs/day: 0.10   Years: 3.00   Total pack years: 0.30   Types: Cigarettes   Quit date: 12/11/1963   Years since quitting: 59.0  Smokeless Tobacco Never    Goals Met:  Exercise tolerated well No report of concerns or symptoms today Strength training completed today  Goals Unmet:  Not Applicable  Comments: Service time is from 1320 to 1510    Dr. Rodman Pickle is Medical Director for Pulmonary Rehab at [facility].

## 2022-12-18 ENCOUNTER — Ambulatory Visit (HOSPITAL_COMMUNITY): Payer: Medicare Other

## 2022-12-18 ENCOUNTER — Encounter (HOSPITAL_COMMUNITY)
Admission: RE | Admit: 2022-12-18 | Discharge: 2022-12-18 | Disposition: A | Discharge status: Home / Self Care | Payer: Medicare Other | Source: Ambulatory Visit | Attending: Internal Medicine | Admitting: Internal Medicine

## 2022-12-18 ENCOUNTER — Telehealth (HOSPITAL_COMMUNITY): Payer: Self-pay

## 2022-12-18 DIAGNOSIS — J410 Simple chronic bronchitis: Secondary | ICD-10-CM | POA: Diagnosis not present

## 2022-12-18 NOTE — Telephone Encounter (Signed)
Called pt about inclement weather and pt wanted to come to 1:15pm class.

## 2022-12-18 NOTE — Progress Notes (Signed)
Home Exercise Prescription I have reviewed a Home Exercise Prescription with Shannon Bowman.  She is currently not exercising, just active. Discussed adding one nonrehab day a week. She is interested in returning to the [facility] and doing exercise like she does in [place]. She could also walk on her street, building up to 30 min. Pt agreeable. The patient stated that their goals were to stay healthy and moving. We reviewed exercise guidelines, target heart rate during exercise, RPE Scale, weather conditions, endpoints for exercise, warmup and cool down. The patient is encouraged to come to me with any questions. I will continue to follow up with the patient to assist them with progression and safety.    Shannon Bowman, [place], ACSM-CEP 12/18/2022 2:50 PM

## 2022-12-18 NOTE — Progress Notes (Signed)
Daily Session Note  Patient Details  Name: Shannon Bowman MRN: [id number] Date of Birth: 08-01-1932 Referring Provider:   April Manson Pulmonary Rehab Walk Test from 11/16/2022 in [facility] for Heart, Vascular, & [facility]  Referring Provider Custovic       Encounter Date: 12/18/2022  Check In:  Session Check In - 12/18/22 1323       Check-In   Supervising physician immediately available to respond to emergencies [facility] MD immediately available    Physician(s) Mannam    Location [facility]    Staff Present Elmon Else, MS, ACSM-CEP, Exercise Physiologist;Mary Margette Fast, RN, BSN;Harshith Pursell BS, ACSM-CEP, Exercise Physiologist;Samantha [place], RD, LDN    Virtual Visit No    Medication changes reported     No    Fall or balance concerns reported    No    Tobacco Cessation No Change    Warm-up and Cool-down Performed as group-led instruction    Resistance Training Performed Yes    VAD Patient? No    PAD/SET Patient? No      Pain Assessment   Currently in Pain? No/denies    Pain Score 0-No pain    Multiple Pain Sites No             Capillary Blood Glucose: No results found for this or any previous visit (from the past 24 hour(s)).    Social History   Tobacco Use  Smoking Status Former   Packs/day: 0.10   Years: 3.00   Total pack years: 0.30   Types: Cigarettes   Quit date: 12/11/1963   Years since quitting: 59.0  Smokeless Tobacco Never    Goals Met:  Exercise tolerated well No report of concerns or symptoms today Strength training completed today  Goals Unmet:  Not Applicable  Comments: Service time is from 1311 to 1432.    Dr. Rodman Pickle is Medical Director for Pulmonary Rehab at [facility].

## 2022-12-19 ENCOUNTER — Ambulatory Visit (INDEPENDENT_AMBULATORY_CARE_PROVIDER_SITE_OTHER): Payer: Medicare Other | Admitting: Pulmonary Disease

## 2022-12-19 ENCOUNTER — Encounter: Payer: Self-pay | Admitting: Pulmonary Disease

## 2022-12-19 VITALS — BP 118/76 | HR 82 | Ht 60.5 in | Wt 127.6 lb

## 2022-12-19 DIAGNOSIS — R0609 Other forms of dyspnea: Secondary | ICD-10-CM | POA: Diagnosis not present

## 2022-12-19 DIAGNOSIS — J4520 Mild intermittent asthma, uncomplicated: Secondary | ICD-10-CM

## 2022-12-19 LAB — PULMONARY FUNCTION TEST
DL/VA % pred: 80 %
DL/VA: 3.33 ml/min/mmHg/L
DLCO cor % pred: 85 %
DLCO cor: 13.81 ml/min/mmHg
DLCO unc % pred: 85 %
DLCO unc: 13.81 ml/min/mmHg
FEF 25-75 Post: 1.43 L/sec
FEF 25-75 Pre: 1.37 L/sec
FEF2575-%Change-Post: 4 %
FEF2575-%Pred-Post: 180 %
FEF2575-%Pred-Pre: 173 %
FEV1-%Change-Post: 0 %
FEV1-%Pred-Post: 128 %
FEV1-%Pred-Pre: 127 %
FEV1-Post: 1.7 L
FEV1-Pre: 1.69 L
FEV1FVC-%Change-Post: 4 %
FEV1FVC-%Pred-Pre: 106 %
FEV6-%Change-Post: -3 %
FEV6-%Pred-Post: 122 %
FEV6-%Pred-Pre: 126 %
FEV6-Post: 2.07 L
FEV6-Pre: 2.15 L
FEV6FVC-%Change-Post: 0 %
FEV6FVC-%Pred-Post: 105 %
FEV6FVC-%Pred-Pre: 105 %
FVC-%Change-Post: -3 %
FVC-%Pred-Post: 115 %
FVC-%Pred-Pre: 120 %
FVC-Post: 2.12 L
FVC-Pre: 2.2 L
Post FEV1/FVC ratio: 80 %
Post FEV6/FVC ratio: 98 %
Pre FEV1/FVC ratio: 77 %
Pre FEV6/FVC Ratio: 98 %
RV % pred: 79 %
RV: 1.88 L
TLC % pred: 89 %
TLC: 4.06 L

## 2022-12-19 NOTE — Progress Notes (Signed)
PFT done today. 

## 2022-12-19 NOTE — Progress Notes (Signed)
Synopsis: Referred in November 2023 for shortness of breath by Floydene Flock, DO  Subjective:   PATIENT ID: Shannon Bowman GENDER: female DOB: 1932/07/03, MRN: [id number]  HPI  Chief Complaint  Patient presents with   Follow-up    F/U after PFT. States her breathing has improved since last visit. Enrolled in pulm rehab.    Shannon Bowman is an 89 year old woman, former smoker with GERD, hiatal hernia and TIA in August who returns to pulmonary clinic for dyspnea and cough.   She was started on advair 115/33mg 2 puffs twice daily at last visit with improvement in her breathing. She started to use it as needed instead of scheduled. She feels pulmonary rehab has helped with her shortness of breath.   PFTs are within normal limits today.  Initial OV 10/16/22 She reports having shortness of breath when bathing herself and getting dressed. She also notices the dyspnea when cleaning her home. Recently she had to stop to rest multiple times cleaning her den. She reports episodes where she stops to take deep breaths and will shake all over. She has not lost consciousness. She has a constant cough where she is clearing her throat. The cough can be productive of clear phlegm in the mornings. She denies night time awakenings due to the cough or dyspnea. She is sleeping flat on her mattress.   She smoked in the past for a few years. She was exposed to second hand smoke in childhood and in social settings. She does not have occupational exposures to dusts or chemicals. Denies any hobbies with dusts or chemical exposures. Her mother had rheumatoid arthritis and a cousin with RA. She does complain of joint pains in her hands.   Past Medical History:  Diagnosis Date   Arthritis    Cataract    right eye   Cataract, nuclear sclerotic, left eye 06/02/2020   The nature of cataract was discussed with the patient as well as the elective nature of surgery. The patient was reassured that surgery at a later  date does not put the patient at risk for a worse outcome. It was emphasized that the need for surgery is dictated by the patient's quality of life as influenced by the cataract. Patient was instructed to maintain close follow up with their general eye    Chronic kidney disease    states Dr. PDelfina Redwoodtold her CKD stage 3   GERD (gastroesophageal reflux disease)    History of hiatal hernia    Spinal stenosis    TIA (transient ischemic attack) 2023     Family History  Problem Relation Age of Onset   Stroke Mother    Diabetes Mother    Stroke Father      Social History   Socioeconomic History   Marital status: Widowed    Spouse name: Not on file   Number of children: 2   Years of education: Not on file   Highest education level: Not on file  Occupational History   Not on file  Tobacco Use   Smoking status: Former    Packs/day: 0.10    Years: 3.00    Total pack years: 0.30    Types: Cigarettes    Quit date: 12/11/1963    Years since quitting: 59.0   Smokeless tobacco: Never  Substance and Sexual Activity   Alcohol use: Yes    Comment: 1/day (wine)   Drug use: No   Sexual activity: Not on file  Other Topics  Concern   Not on file  Social History Narrative   Likes to go shopping, and socialize with friends and family, likes to read   Social Determinants of Health   Financial Resource Strain: Not on file  Food Insecurity: Not on file  Transportation Needs: Not on file  Physical Activity: Not on file  Stress: Not on file  Social Connections: Not on file  Intimate Partner Violence: Not on file     Allergies  Allergen Reactions   Phenylephrine Other (See Comments)    Possible allergy to dilating drop phenylephrine 2.5% - USE Tropicamide 1% only to dilate Possible allergy to dilating drop phenylephrine 2.5% - USE Tropicamide 1% only to dilate    Penicillins Rash     Outpatient Medications Prior to Visit  Medication Sig Dispense Refill   acetaminophen (TYLENOL) 325  MG tablet Take 650 mg by mouth every 6 (six) hours as needed for mild pain.     amLODipine (NORVASC) 5 MG tablet TAKE 1 TABLET BY MOUTH AT BEDTIME 90 tablet 1   ascorbic acid (VITAMIN C) 500 MG tablet Take 500 mg by mouth daily.     aspirin EC 81 MG tablet Take 81 mg by mouth daily. Swallow whole.     calcium carbonate (OS-CAL) 600 MG TABS Take 600 mg by mouth 2 (two) times daily with a meal.     cetirizine (ZYRTEC) 10 MG tablet Take 10 mg by mouth daily as needed for allergies.      Cholecalciferol (VITAMIN D3) 25 MCG (1000 UT) CAPS Take by mouth.     estradiol (ESTRACE) 0.1 MG/GM vaginal cream Place 1 Applicatorful vaginally once a week.     ezetimibe (ZETIA) 10 MG tablet Take 1 tablet (10 mg total) by mouth daily.     fluticasone-salmeterol (ADVAIR HFA) 115-21 MCG/ACT inhaler Inhale 2 puffs into the lungs 2 (two) times daily. 1 each 1   Probiotic CAPS Take 2 capsules by mouth daily.     Propylene Glycol (SYSTANE BALANCE OP) Apply 1 drop to eye at bedtime.     clopidogrel (PLAVIX) 75 MG tablet TAKE 1 TABLET BY MOUTH DAILY (Patient not taking: Reported on 11/16/2022) 30 tablet 0   losartan-hydrochlorothiazide (HYZAAR) 50-12.5 MG tablet TAKE ONE TABLET BY MOUTH EVERY MORNING (Patient not taking: Reported on 11/16/2022) 90 tablet 1   rosuvastatin (CRESTOR) 20 MG tablet Take 1 tablet (20 mg total) by mouth daily. 90 tablet 1   rosuvastatin (CRESTOR) 40 MG tablet TAKE 1 TABLET BY MOUTH AT BEDTIME (Patient not taking: Reported on 11/16/2022) 90 tablet 1   traMADol (ULTRAM) 50 MG tablet Take 50 mg by mouth every 6 (six) hours as needed. (Patient not taking: Reported on 11/16/2022)     No facility-administered medications prior to visit.   Review of Systems  Constitutional:  Negative for chills, fever, malaise/fatigue and weight loss.  HENT:  Negative for congestion, sinus pain and sore throat.   Eyes: Negative.   Respiratory:  Negative for cough, hemoptysis, sputum production, shortness of breath and  wheezing.   Cardiovascular:  Negative for chest pain, palpitations, orthopnea, claudication and leg swelling.  Gastrointestinal:  Negative for abdominal pain, heartburn, nausea and vomiting.  Genitourinary: Negative.   Musculoskeletal:  Negative for myalgias.  Skin:  Negative for rash.  Neurological:  Negative for weakness.  Endo/Heme/Allergies: Negative.   Psychiatric/Behavioral: Negative.     Objective:   Vitals:   12/19/22 1556  BP: 118/76  Pulse: 82  SpO2: 100%  Weight:  127 lb 9.6 oz (57.9 kg)  Height: 5' 0.5" (1.537 m)     Physical Exam Constitutional:      General: She is not in acute distress.    Appearance: She is not ill-appearing.  HENT:     Head: Normocephalic and atraumatic.  Eyes:     General: No scleral icterus.    Conjunctiva/sclera: Conjunctivae normal.  Cardiovascular:     Rate and Rhythm: Normal rate and regular rhythm.     Pulses: Normal pulses.     Heart sounds: Normal heart sounds. No murmur heard. Pulmonary:     Effort: Pulmonary effort is normal.     Breath sounds: Normal breath sounds. No wheezing, rhonchi or rales.  Musculoskeletal:     Right lower leg: No edema.     Left lower leg: No edema.  Lymphadenopathy:     Cervical: No cervical adenopathy.  Skin:    General: Skin is warm and dry.  Neurological:     General: No focal deficit present.     Mental Status: She is alert.    CBC    Component Value Date/Time   WBC 5.6 07/21/2015 1335   RBC 4.05 07/21/2015 1335   HGB 13.0 07/21/2015 1335   HCT 38.3 07/21/2015 1335   PLT 200 07/21/2015 1335   MCV 94.6 07/21/2015 1335   MCH 32.1 07/21/2015 1335   MCHC 33.9 07/21/2015 1335   RDW 12.4 07/21/2015 1335   Chest imaging: CXR 09/26/22 Cardiomediastinal silhouette within normal limits in size and contour. No evidence of central vascular congestion. No interlobular septal thickening.   Airspace opacity in the upper right and lower right lung with no comparison. Coarsened interstitial  markings.   No pneumothorax or pleural effusion.   No acute displaced fracture. Degenerative changes of the spine  PFT:    Latest Ref Rng & Units 12/19/2022    2:38 PM  PFT Results  FVC-Pre L 2.20  P  FVC-Predicted Pre % 120  P  FVC-Post L 2.12  P  FVC-Predicted Post % 115  P  Pre FEV1/FVC % % 77  P  Post FEV1/FCV % % 80  P  FEV1-Pre L 1.69  P  FEV1-Predicted Pre % 127  P  FEV1-Post L 1.70  P  DLCO uncorrected ml/min/mmHg 13.81  P  DLCO UNC% % 85  P  DLCO corrected ml/min/mmHg 13.81  P  DLCO COR %Predicted % 85  P  DLVA Predicted % 80  P  TLC L 4.06  P  TLC % Predicted % 89  P  RV % Predicted % 79  P    P Preliminary result    Labs:  Path:  Echo:  Heart Catheterization:  Assessment & Plan:   Mild intermittent asthma without complication  Discussion: Shannon Bowman is an 89 year old woman, former smoker with GERD, hiatal hernia and TIA in August who returns to pulmonary clinic for mild intermittent asthma.   She is to continue advair HFA 115-62mg 2 puffs twice daily as needed. Pfts are within normal limits today.  She is to continue pulmonary rehab.  Follow up in 6 months.  JFreda Jackson MD [facility] Office: [phone]  Current Outpatient Medications:    acetaminophen (TYLENOL) 325 MG tablet, Take 650 mg by mouth every 6 (six) hours as needed for mild pain., Disp: , Rfl:    amLODipine (NORVASC) 5 MG tablet, TAKE 1 TABLET BY MOUTH AT BEDTIME, Disp: 90 tablet, Rfl: 1  ascorbic acid (VITAMIN C) 500 MG tablet, Take 500 mg by mouth daily., Disp: , Rfl:    aspirin EC 81 MG tablet, Take 81 mg by mouth daily. Swallow whole., Disp: , Rfl:    calcium carbonate (OS-CAL) 600 MG TABS, Take 600 mg by mouth 2 (two) times daily with a meal., Disp: , Rfl:    cetirizine (ZYRTEC) 10 MG tablet, Take 10 mg by mouth daily as needed for allergies. , Disp: , Rfl:    Cholecalciferol (VITAMIN D3) 25 MCG (1000 UT) CAPS, Take by mouth., Disp: , Rfl:     estradiol (ESTRACE) 0.1 MG/GM vaginal cream, Place 1 Applicatorful vaginally once a week., Disp: , Rfl:    ezetimibe (ZETIA) 10 MG tablet, Take 1 tablet (10 mg total) by mouth daily., Disp: , Rfl:    fluticasone-salmeterol (ADVAIR HFA) 115-21 MCG/ACT inhaler, Inhale 2 puffs into the lungs 2 (two) times daily., Disp: 1 each, Rfl: 1   Probiotic CAPS, Take 2 capsules by mouth daily., Disp: , Rfl:    Propylene Glycol (SYSTANE BALANCE OP), Apply 1 drop to eye at bedtime., Disp: , Rfl:

## 2022-12-19 NOTE — Patient Instructions (Addendum)
You can use advair inhaler as needed for shortness of breath  Your breathing tests are within normal limits today  Continue cardiopulmonary rehab  Follow up in 6 months

## 2022-12-20 ENCOUNTER — Ambulatory Visit (HOSPITAL_COMMUNITY): Payer: Medicare Other

## 2022-12-20 ENCOUNTER — Encounter (HOSPITAL_COMMUNITY)
Admission: RE | Admit: 2022-12-20 | Discharge: 2022-12-20 | Disposition: A | Discharge status: Home / Self Care | Payer: Medicare Other | Source: Ambulatory Visit | Attending: Internal Medicine | Admitting: Internal Medicine

## 2022-12-20 DIAGNOSIS — J410 Simple chronic bronchitis: Secondary | ICD-10-CM | POA: Diagnosis not present

## 2022-12-20 NOTE — Progress Notes (Signed)
Daily Session Note  Patient Details  Name: Shannon Bowman MRN: [id number] Date of Birth: 1932-09-10 Referring Provider:   April Manson Pulmonary Rehab Walk Test from 11/16/2022 in [facility]  Referring Provider Custovic       Encounter Date: 12/20/2022  Check In:  Session Check In - 12/20/22 1425       Check-In   Supervising physician immediately available to respond to emergencies [facility] MD immediately available    Physician(s) Mannam    Location [facility]    Staff Present Elmon Else, MS, ACSM-CEP, Exercise Physiologist;Mary Margette Fast, RN, BSN;Randi Reeve BS, ACSM-CEP, Exercise Physiologist;Samantha [place], RD, LDN;Jetta Walker BS, ACSM-CEP, Exercise Physiologist    Virtual Visit No    Medication changes reported     No    Fall or balance concerns reported    No    Tobacco Cessation No Change    Warm-up and Cool-down Performed as group-led instruction    Resistance Training Performed Yes    VAD Patient? No    PAD/SET Patient? No      Pain Assessment   Currently in Pain? No/denies    Pain Score 0-No pain    Multiple Pain Sites No             Capillary Blood Glucose: No results found for this or any previous visit (from the past 24 hour(s)).    Social History   Tobacco Use  Smoking Status Former   Packs/day: 0.10   Years: 3.00   Total pack years: 0.30   Types: Cigarettes   Quit date: 12/11/1963   Years since quitting: 59.0  Smokeless Tobacco Never    Goals Met:  Proper associated with RPD/PD & O2 Sat Exercise tolerated well No report of concerns or symptoms today Strength training completed today  Goals Unmet:  Not Applicable  Comments: Service time is from 1318 to 1441.    Dr. Rodman Pickle is Medical Director for Pulmonary Rehab at [facility].

## 2022-12-22 ENCOUNTER — Encounter: Payer: Self-pay | Admitting: Pulmonary Disease

## 2022-12-25 ENCOUNTER — Ambulatory Visit (HOSPITAL_COMMUNITY): Payer: Medicare Other

## 2022-12-25 ENCOUNTER — Encounter (HOSPITAL_COMMUNITY)
Admission: RE | Admit: 2022-12-25 | Discharge: 2022-12-25 | Disposition: A | Discharge status: Home / Self Care | Payer: Medicare Other | Source: Ambulatory Visit | Attending: Internal Medicine | Admitting: Internal Medicine

## 2022-12-25 VITALS — Wt 126.3 lb

## 2022-12-25 DIAGNOSIS — J410 Simple chronic bronchitis: Secondary | ICD-10-CM

## 2022-12-25 NOTE — Progress Notes (Signed)
Daily Session Note  Patient Details  Name: Shannon Bowman MRN: [id number] Date of Birth: 09/01/1932 Referring Provider:   April Manson Pulmonary Rehab Walk Test from 11/16/2022 in [facility]  Referring Provider Custovic       Encounter Date: 12/25/2022  Check In:  Session Check In - 12/25/22 1425       Check-In   Supervising physician immediately available to respond to emergencies [facility] MD immediately available    Physician(s) Eric Form, NP    Location [facility]    Staff Present Elmon Else, MS, ACSM-CEP, Exercise Physiologist;Delainey Winstanley Margette Fast, RN, BSN;Randi Reeve BS, ACSM-CEP, Exercise Physiologist;Other    Virtual Visit No    Medication changes reported     No    Fall or balance concerns reported    No    Tobacco Cessation No Change    Warm-up and Cool-down Performed as group-led instruction    Resistance Training Performed Yes    VAD Patient? No    PAD/SET Patient? No      Pain Assessment   Currently in Pain? No/denies    Multiple Pain Sites No             Capillary Blood Glucose: No results found for this or any previous visit (from the past 24 hour(s)).   Exercise Prescription Changes - 12/25/22 1500       Response to Exercise   Blood Pressure (Admit) 144/60    Blood Pressure (Exercise) 158/70    Blood Pressure (Exit) 120/64    Heart Rate (Admit) 68 bpm    Heart Rate (Exercise) 101 bpm    Heart Rate (Exit) 80 bpm    Oxygen Saturation (Admit) 99 %    Oxygen Saturation (Exercise) 98 %    Oxygen Saturation (Exit) 98 %    Rating of Perceived Exertion (Exercise) 13    Perceived Dyspnea (Exercise) 1    Duration Continue with 30 min of aerobic exercise without signs/symptoms of physical distress.    Intensity THRR unchanged      Progression   Progression Continue to progress workloads to maintain intensity without signs/symptoms of physical distress.      Resistance Training    Training Prescription Yes    Weight Red Bands    Reps 10-15    Time 10 Minutes      Recumbant Bike   Level 1    RPM 70    Minutes 15    METs 2.7      NuStep   Level 4    SPM 70    Minutes 15    METs 2.5             Social History   Tobacco Use  Smoking Status Former   Packs/day: 0.10   Years: 3.00   Total pack years: 0.30   Types: Cigarettes   Quit date: 12/11/1963   Years since quitting: 59.0  Smokeless Tobacco Never    Goals Met:  Improved SOB with ADL's Exercise tolerated well No report of concerns or symptoms today Strength training completed today  Goals Unmet:  Not Applicable  Comments: Service time is from 1318 to [place]    Dr. Rodman Pickle is Medical Director for Pulmonary Rehab at [facility].

## 2022-12-26 ENCOUNTER — Telehealth: Payer: Self-pay

## 2022-12-26 ENCOUNTER — Encounter: Payer: Self-pay | Admitting: Internal Medicine

## 2022-12-26 ENCOUNTER — Ambulatory Visit: Payer: Medicare Other | Admitting: Internal Medicine

## 2022-12-26 VITALS — BP 132/60 | HR 72 | Ht 60.0 in | Wt 126.2 lb

## 2022-12-26 DIAGNOSIS — I10 Essential (primary) hypertension: Secondary | ICD-10-CM

## 2022-12-26 DIAGNOSIS — E785 Hyperlipidemia, unspecified: Secondary | ICD-10-CM

## 2022-12-26 DIAGNOSIS — R0609 Other forms of dyspnea: Secondary | ICD-10-CM

## 2022-12-26 NOTE — Progress Notes (Signed)
Primary Physician/Referring:  Sueanne Margarita, DO  Patient ID: Shannon Bowman, female    DOB: [birth year], 89 y.o.   MRN: [id number]  Chief Complaint  Patient presents with   dyspnea on exertion   Follow-up    HPI:    Shannon Bowman  is a 89 y.o. female with past medical history significant for CKD, hypertension, and hyperlipidemia, and recent TIA who is here for a follow-up visit.  Patient is doing very well. She just had her follow-up with pulmonology and she is using the inhaler PRN. She admits she still gets winded with heavy exertion but nothing like she used to get. Patient is also getting better with pulmonary rehab and [facility]. She denies chest pain, palpitations, diaphoresis, syncope, claudication.  Past Medical History:  Diagnosis Date   Arthritis    Cataract    right eye   Cataract, nuclear sclerotic, left eye 06/02/2020   The nature of cataract was discussed with the patient as well as the elective nature of surgery. The patient was reassured that surgery at a later date does not put the patient at risk for a worse outcome. It was emphasized that the need for surgery is dictated by the patient's quality of life as influenced by the cataract. Patient was instructed to maintain close follow up with their general eye    Chronic kidney disease    states Dr. Delfina Redwood told her CKD stage 3   GERD (gastroesophageal reflux disease)    History of hiatal hernia    Spinal stenosis    TIA (transient ischemic attack) 2023   Past Surgical History:  Procedure Laterality Date   COLONOSCOPY     eyelid surgery     to improve drooping of eyelids   INGUINAL HERNIA REPAIR N/A 07/29/2015   Procedure: LAPAROSCOPIC INGUINAL HERNIA REPAIR WITH MESH;  Surgeon: Greer Pickerel, MD;  Location: [facility];  Service: General;  Laterality: N/A;   MULTIPLE TOOTH EXTRACTIONS     TONSILLECTOMY     UPPER GI ENDOSCOPY     Family History   Problem Relation Age of Onset   Stroke Mother    Diabetes Mother    Stroke Father     Social History   Tobacco Use   Smoking status: Former    Packs/day: 0.10    Years: 3.00    Total pack years: 0.30    Types: Cigarettes    Quit date: 12/11/1963    Years since quitting: 59.0   Smokeless tobacco: Never  Substance Use Topics   Alcohol use: Yes    Comment: 1/day (wine)   Marital Status: Widowed  ROS  Review of Systems  Constitutional: Negative for malaise/fatigue.  Cardiovascular:  Positive for dyspnea on exertion (improved significantly). Negative for chest pain, leg swelling and palpitations.  Respiratory:  Negative for shortness of breath.   Neurological:  Negative for light-headedness.  All other systems reviewed and are negative.  Objective  Blood pressure 132/60, pulse 72, height 5' (1.524 m), weight 126 lb 3.2 oz (57.2 kg), SpO2 98 %. Body mass index is 24.65 kg/m.     12/26/2022   11:05 AM 12/26/2022   11:03 AM 12/26/2022   10:59 AM  Vitals with BMI  Height   '5\' 0"'$   Weight   126 lbs 3 oz  BMI   09.47  Systolic 096 283 662  Diastolic 60 70 71  Pulse 72 79 74    Orthostatic VS for the past 72 hrs (Last 3 readings):  Patient Position BP Location Cuff Size  12/26/22 1105 Sitting Left Arm Small  12/26/22 1103 Sitting Left Arm Small  12/26/22 1059 Sitting Left Arm Small     Physical Exam Vitals reviewed.  Neck:     Vascular: No carotid bruit.  Cardiovascular:     Rate and Rhythm: Normal rate and regular rhythm.     Pulses: Normal pulses.     Heart sounds: Normal heart sounds. No murmur heard. Pulmonary:     Effort: Pulmonary effort is normal.     Breath sounds: Normal breath sounds.  Abdominal:     General: Abdomen is flat. Bowel sounds are normal. There is abdominal bruit.     Palpations: Abdomen is soft.  Musculoskeletal:     Right lower leg: No edema.     Left lower leg: No edema.  Neurological:     Mental Status: She is alert.    Medications  and allergies   Allergies  Allergen Reactions   Phenylephrine Other (See Comments)    Possible allergy to dilating drop phenylephrine 2.5% - USE Tropicamide 1% only to dilate Possible allergy to dilating drop phenylephrine 2.5% - USE Tropicamide 1% only to dilate    Penicillins Rash     Medication list after today's encounter   Current Outpatient Medications:    acetaminophen (TYLENOL) 325 MG tablet, Take 650 mg by mouth every 6 (six) hours as needed for mild pain., Disp: , Rfl:    amLODipine (NORVASC) 5 MG tablet, TAKE 1 TABLET BY MOUTH AT BEDTIME, Disp: 90 tablet, Rfl: 1   ascorbic acid (VITAMIN C) 500 MG tablet, Take 500 mg by mouth daily., Disp: , Rfl:    aspirin EC 81 MG tablet, Take 81 mg by mouth daily. Swallow whole., Disp: , Rfl:    calcium carbonate (OS-CAL) 600 MG TABS, Take 600 mg by mouth 2 (two) times daily with a meal., Disp: , Rfl:    cetirizine (ZYRTEC) 10 MG tablet, Take 10 mg by mouth daily as needed for allergies. , Disp: , Rfl:    Cholecalciferol (VITAMIN D3) 25 MCG (1000 UT) CAPS, Take by mouth., Disp: , Rfl:    estradiol (ESTRACE) 0.1 MG/GM vaginal cream, Place 1 Applicatorful vaginally once a week., Disp: , Rfl:    ezetimibe (ZETIA) 10 MG tablet, Take 1 tablet (10 mg total) by mouth daily., Disp: , Rfl:    omeprazole (PRILOSEC) 40 MG capsule, Take 40 mg by mouth as needed., Disp: , Rfl:    Probiotic CAPS, Take 2 capsules by mouth daily., Disp: , Rfl:    Propylene Glycol (SYSTANE BALANCE OP), Apply 1 drop to eye at bedtime., Disp: , Rfl:    rosuvastatin (CRESTOR) 20 MG tablet, Take 20 mg by mouth daily., Disp: , Rfl:   Laboratory examination:   Lab Results  Component Value Date   NA 140 07/21/2015   K 4.4 07/21/2015   CO2 29 07/21/2015   GLUCOSE 107 (H) 07/21/2015   BUN 21 (H) 07/21/2015   CREATININE 1.10 (H) 07/20/2022   CALCIUM 9.6 07/21/2015   GFRNONAA 37 (L) 07/21/2015       Latest Ref Rng & Units 07/20/2022    5:13 PM 07/21/2015    1:35 PM  CMP   Glucose 65 - 99 mg/dL  107   BUN 6 - 20 mg/dL  21   Creatinine 0.44 - 1.00 mg/dL 1.10  1.32   Sodium 135 - 145 mmol/L  140   Potassium 3.5 - 5.1 mmol/L  4.4   Chloride 101 - 111 mmol/L  105   CO2 22 - 32 mmol/L  29   Calcium 8.9 - 10.3 mg/dL  9.6       Latest Ref Rng & Units 07/21/2015    1:35 PM  CBC  WBC 4.0 - 10.5 K/uL 5.6   Hemoglobin 12.0 - 15.0 g/dL 13.0   Hematocrit 36.0 - 46.0 % 38.3   Platelets 150 - 400 K/uL 200     Lipid Panel No results for input(s): "CHOL", "TRIG", "[place]", "VLDL", "HDL", "CHOLHDL", "LDLDIRECT" in the last 8760 hours.  HEMOGLOBIN A1C No results found for: "HGBA1C", "MPG" TSH No results for input(s): "TSH" in the last 8760 hours.  External labs:  Lab # Date BUN 11.000 10/31/2022 Cholesterol 183.000 07/23/2022 Creatinine 0.980 07/21/2022 eGFR 59.100 10/31/2022 Glucose, Rand 93.000 10/31/2022 HDL 50.000 07/23/2022 Hemoglobin 12.600 02/23/2021 INR 1.000 07/21/2022 LDL 118.000 07/23/2022 Potassium 4.500 10/31/2022 Triglycerides 74.000 07/23/2022 TSH 3.190 10/24/2022   Radiology:    Cardiac Studies:   ECHO COMPLETE WITH IMAGING ENHANCING AGENT 07/24/2022  Narrative Echo 07/24/2022  1. Left ventricular ejection fraction, by estimation, is 60 to 65%. The left ventricle has normal function. The left ventricle has no regional wall motion abnormalities. There is severe left ventricular hypertrophy. Left ventricular diastolic parameters are consistent with Grade I diastolic dysfunction (impaired relaxation). 2. Right ventricular systolic function is normal. The right ventricular size is normal. 3. The mitral valve is abnormal. Mild mitral valve regurgitation. No evidence of mitral stenosis. Moderate mitral annular calcification. 4. The aortic valve is tricuspid. There is moderate calcification of the aortic valve. Aortic valve regurgitation is mild. Aortic valve sclerosis/calcification is present, without any evidence of aortic stenosis. 5. The  inferior vena cava is normal in size with greater than 50% respiratory variability, suggesting right atrial pressure of 3 mmHg.  Renal artery duplex 10/04/2022:  No evidence of renal artery occlusive disease in either renal artery.  Normal intrarenal vascular perfusion is noted in both kidneys.  Renal length is within normal limits for both kidneys.  Mild diffuse plaque noted in the abdominal aorta.    Exercise nuclear stress test 09/18/2022: Myocardial perfusion is normal. Overall LV systolic function is normal without regional wall motion abnormalities. Stress LV EF: 70%.  Normal ECG stress. The patient exercised for 2 minutes and 35 seconds of a Bruce protocol, achieving approximately 4.64 METs. and 90% of MPHR. Poor exercise tolerance. The blood pressure response was normal. No previous exam available for comparison. Low risk.    Carotid artery duplex 08/17/2022:  Duplex suggests stenosis in the right internal carotid artery (1-15%).  Duplex suggests stenosis in the left internal carotid artery (1-15%).  Minimal heterogeneous plaque noted in carotid arteries.  Antegrade right vertebral artery flow. Antegrade left vertebral artery  flow.  EKG:   EKG 08/17/2022: Normal sinus rhythm at rate of 74 bpm, normal axis, poor R progression, probably normal variant.  T wave abnormality, cannot exclude inferior ischemia.  PVCs (2).  Assessment     ICD-10-CM   1. Essential hypertension  I10     2. DOE (dyspnea on exertion)  R06.09     3. Hyperlipidemia LDL goal <70  E78.5        No orders of the defined types were placed in this encounter.   No orders of the defined types were placed in this encounter.  Medications Discontinued During This Encounter  Medication Reason   fluticasone-salmeterol (ADVAIR HFA) 115-21 MCG/ACT inhaler Completed Course      Recommendations:   Shannon Bowman is a 89 y.o. female who had a recent TIA.     DOE (dyspnea on exertion) Nuclear stress  test negative, very low exercise tolerance Patient is doing better in pulmonary rehab DOE significantly improving Following with pulmonology, has inhaler to use PRN   Essential hypertension Continue current cardiac medications. Encourage low-sodium diet, less than 2000 mg daily. Renal u/s within normal limits Standing BP well controlled, manage HTN based on STANDING BP ONLY   Hyperlipidemia LDL goal <70 Continue Crestor   Follow-up in 6 months or sooner if needed     Floydene Flock, DO, [facility] 12/26/2022, 11:27 AM Office: [phone] Fax: [phone]

## 2022-12-26 NOTE — Telephone Encounter (Signed)
Patient home BP overall is controlled on current antihypertensive regimen.   Average Systolic BP Level 419.37 mmHg Lowest Systolic BP Level 902 mmHg Highest Systolic BP Level 409 mmHg  Average Diastolic BP Level 73.5 mmHg Lowest Diastolic BP Level 57 mmHg Highest Diastolic BP Level 97 mmHg  12/26/2022 Wednesday at 07:46 AM 121 / 71      12/25/2022 Tuesday at 08:48 AM 135 / 71      12/24/2022 Monday at 08:30 AM 133 / 64      12/22/2022 Saturday at 09:16 AM 126 / 64      12/21/2022 Friday at 08:27 AM 128 / 75      12/20/2022 Thursday at 08:26 AM 125 / 69      12/15/2022 Saturday at 08:04 AM 135 / 73      12/14/2022 Friday at 07:18 AM 123 / 72      12/12/2022 Wednesday at 08:13 AM 139 / 59      12/11/2022 Tuesday at 08:34 AM 118 / 57      12/10/2022 Monday at 08:35 AM 129 / 93

## 2022-12-26 NOTE — Progress Notes (Signed)
Pulmonary Individual Treatment Plan  Patient Details  Name: Shannon Bowman MRN: [id number] Date of Birth: 1932-04-07 Referring Provider:   April Manson Pulmonary Rehab Walk Test from 11/16/2022 in [facility] [place]  Referring Provider Custovic       Initial Encounter Date:  Flowsheet Row Pulmonary Rehab Walk Test from 11/16/2022 in [facility] [place]  Date 11/16/22       Visit Diagnosis: Simple chronic bronchitis ([place])  Patient's Home Medications on Admission:   Current Outpatient Medications:    acetaminophen (TYLENOL) 325 MG tablet, Take 650 mg by mouth every 6 (six) hours as needed for mild pain., Disp: , Rfl:    amLODipine (NORVASC) 5 MG tablet, TAKE 1 TABLET BY MOUTH AT BEDTIME, Disp: 90 tablet, Rfl: 1   ascorbic acid (VITAMIN C) 500 MG tablet, Take 500 mg by mouth daily., Disp: , Rfl:    aspirin EC 81 MG tablet, Take 81 mg by mouth daily. Swallow whole., Disp: , Rfl:    calcium carbonate (OS-CAL) 600 MG TABS, Take 600 mg by mouth 2 (two) times daily with a meal., Disp: , Rfl:    cetirizine (ZYRTEC) 10 MG tablet, Take 10 mg by mouth daily as needed for allergies. , Disp: , Rfl:    Cholecalciferol (VITAMIN D3) 25 MCG (1000 UT) CAPS, Take by mouth., Disp: , Rfl:    estradiol (ESTRACE) 0.1 MG/GM vaginal cream, Place 1 Applicatorful vaginally once a week., Disp: , Rfl:    ezetimibe (ZETIA) 10 MG tablet, Take 1 tablet (10 mg total) by mouth daily., Disp: , Rfl:    omeprazole (PRILOSEC) 40 MG capsule, Take 40 mg by mouth as needed., Disp: , Rfl:    Probiotic CAPS, Take 2 capsules by mouth daily., Disp: , Rfl:    Propylene Glycol (SYSTANE BALANCE OP), Apply 1 drop to eye at bedtime., Disp: , Rfl:    rosuvastatin (CRESTOR) 20 MG tablet, Take 20 mg by mouth daily., Disp: , Rfl:   Past Medical History: Past Medical History:  Diagnosis Date   Arthritis    Cataract    right eye    Cataract, nuclear sclerotic, left eye 06/02/2020   The nature of cataract was discussed with the patient as well as the elective nature of surgery. The patient was reassured that surgery at a later date does not put the patient at risk for a worse outcome. It was emphasized that the need for surgery is dictated by the patient's quality of life as influenced by the cataract. Patient was instructed to maintain close follow up with their general eye    Chronic kidney disease    states Dr. Delfina Redwood told her CKD stage 3   GERD (gastroesophageal reflux disease)    History of hiatal hernia    Spinal stenosis    TIA (transient ischemic attack) 2023    Tobacco Use: Social History   Tobacco Use  Smoking Status Former   Packs/day: 0.10   Years: 3.00   Total pack years: 0.30   Types: Cigarettes   Quit date: 12/11/1963   Years since quitting: 59.0  Smokeless Tobacco Never    Labs: Review Flowsheet        No data to display          Capillary Blood Glucose: No results found for: "GLUCAP"   Pulmonary Assessment Scores:  Pulmonary Assessment Scores     Row Name 11/16/22 1112  ADL UCSD   SOB Score total 56       CAT Score   CAT Score 18       mMRC Score   mMRC Score 4             UCSD: Self-administered rating of dyspnea associated with activities of daily living (ADLs) 6-point scale (0 = "not at all" to 5 = "maximal or unable to do because of breathlessness")  Scoring Scores range from 0 to 120.  Minimally important difference is 5 units  CAT: CAT can identify the health impairment of COPD patients and is better correlated with disease progression.  CAT has a scoring range of zero to 40. The CAT score is classified into four groups of low (less than 10), medium (10 - 20), high (21-30) and very high (31-40) based on the impact level of disease on health status. A CAT score over 10 suggests significant symptoms.  A worsening CAT score could be explained by an  exacerbation, poor medication adherence, poor inhaler technique, or progression of COPD or comorbid conditions.  CAT MCID is 2 points  mMRC: mMRC (Modified Medical Research Council) Dyspnea Scale is used to assess the degree of baseline functional disability in patients of respiratory disease due to dyspnea. No minimal important difference is established. A decrease in score of 1 point or greater is considered a positive change.   Pulmonary Function Assessment:  Pulmonary Function Assessment - 11/16/22 1115       Breath   Shortness of Breath Fear of Shortness of Breath;Limiting activity             Exercise Target Goals: Exercise Program Goal: Individual exercise prescription set using results from initial 6 min walk test and THRR while considering  patient's activity barriers and safety.   Exercise Prescription Goal: Initial exercise prescription builds to 30-45 minutes a day of aerobic activity, 2-3 days per week.  Home exercise guidelines will be given to patient during program as part of exercise prescription that the participant will acknowledge.  Activity Barriers & Risk Stratification:  Activity Barriers & Cardiac Risk Stratification - 11/16/22 1114       Activity Barriers & Cardiac Risk Stratification   Activity Barriers Deconditioning;Muscular Weakness;Shortness of Breath;Arthritis;Joint Problems;Neck/Spine Problems             6 Minute Walk:  6 Minute Walk     Row Name 11/16/22 1206         6 Minute Walk   Phase Initial     Distance 1190 feet     Walk Time 6 minutes     # of Rest Breaks 0     MPH 2.25     METS 1.96     RPE 13     Perceived Dyspnea  3     VO2 Peak 6.86     Symptoms Yes (comment)     Comments Pt c/o lightheadedness and shaky feeling 4 1/2 min in to walk test. Declined rest. Eventually resolved after 5 min rest. Sts she has had this feeling before when she gets SOB/exerted. (note:used forehead probe for SpO2)     Resting HR 85 bpm      Resting BP 146/64     Resting Oxygen Saturation  100 %     Exercise Oxygen Saturation  during 6 min walk 100 %     Max Ex. HR 101 bpm     Max Ex. BP 150/60     2 Minute Post  BP 138/60       Interval HR   1 Minute HR 89     2 Minute HR 97     3 Minute HR 94     4 Minute HR 94     5 Minute HR 99     6 Minute HR 101     2 Minute Post HR 84     Interval Heart Rate? Yes       Interval Oxygen   Interval Oxygen? Yes     Baseline Oxygen Saturation % 100 %     1 Minute Oxygen Saturation % 100 %     1 Minute Liters of Oxygen 0 L     2 Minute Oxygen Saturation % 99 %     2 Minute Liters of Oxygen 0 L     3 Minute Oxygen Saturation % 100 %     3 Minute Liters of Oxygen 0 L     4 Minute Oxygen Saturation % 100 %     4 Minute Liters of Oxygen 0 L     5 Minute Oxygen Saturation % 100 %     5 Minute Liters of Oxygen 0 L     6 Minute Oxygen Saturation % 100 %     6 Minute Liters of Oxygen 0 L     2 Minute Post Oxygen Saturation % 100 %     2 Minute Post Liters of Oxygen 0 L              Oxygen Initial Assessment:  Oxygen Initial Assessment - 11/16/22 1115       Home Oxygen   Home Oxygen Device None    Sleep Oxygen Prescription None    Home Exercise Oxygen Prescription None    Home Resting Oxygen Prescription None      Initial 6 min Walk   Oxygen Used None      Program Oxygen Prescription   Program Oxygen Prescription None      Intervention   Short Term Goals To learn and understand importance of maintaining oxygen saturations>88%;To learn and understand importance of monitoring SPO2 with pulse oximeter and demonstrate accurate use of the pulse oximeter.;To learn and demonstrate proper pursed lip breathing techniques or other breathing techniques.     Long  Term Goals Maintenance of O2 saturations>88%;Verbalizes importance of monitoring SPO2 with pulse oximeter and return demonstration;Exhibits proper breathing techniques, such as pursed lip breathing or other method  taught during program session             Oxygen Re-Evaluation:  Oxygen Re-Evaluation     Row Name 11/26/22 1449 12/25/22 0947           Program Oxygen Prescription   Program Oxygen Prescription None None        Home Oxygen   Home Oxygen Device None None      Sleep Oxygen Prescription None None      Home Exercise Oxygen Prescription None None      Home Resting Oxygen Prescription None None        Goals/Expected Outcomes   Short Term Goals To learn and understand importance of maintaining oxygen saturations>88%;To learn and understand importance of monitoring SPO2 with pulse oximeter and demonstrate accurate use of the pulse oximeter.;To learn and demonstrate proper pursed lip breathing techniques or other breathing techniques.  To learn and understand importance of maintaining oxygen saturations>88%;To learn and understand importance of monitoring SPO2 with pulse oximeter and demonstrate accurate use  of the pulse oximeter.;To learn and demonstrate proper pursed lip breathing techniques or other breathing techniques.       Long  Term Goals Maintenance of O2 saturations>88%;Verbalizes importance of monitoring SPO2 with pulse oximeter and return demonstration;Exhibits proper breathing techniques, such as pursed lip breathing or other method taught during program session Maintenance of O2 saturations>88%;Verbalizes importance of monitoring SPO2 with pulse oximeter and return demonstration;Exhibits proper breathing techniques, such as pursed lip breathing or other method taught during program session      Comments -- no change      Goals/Expected Outcomes Compliance and understanding of oxygen saturation monitoring and breathing techniques to decrease shortness of breath. Compliance and understanding of oxygen saturation monitoring and breathing techniques to decrease shortness of breath. Compliance and understanding of oxygen saturation monitoring and breathing techniques to decrease  shortness of breath.               Oxygen Discharge (Final Oxygen Re-Evaluation):  Oxygen Re-Evaluation - 12/25/22 0947       Program Oxygen Prescription   Program Oxygen Prescription None      Home Oxygen   Home Oxygen Device None    Sleep Oxygen Prescription None    Home Exercise Oxygen Prescription None    Home Resting Oxygen Prescription None      Goals/Expected Outcomes   Short Term Goals To learn and understand importance of maintaining oxygen saturations>88%;To learn and understand importance of monitoring SPO2 with pulse oximeter and demonstrate accurate use of the pulse oximeter.;To learn and demonstrate proper pursed lip breathing techniques or other breathing techniques.     Long  Term Goals Maintenance of O2 saturations>88%;Verbalizes importance of monitoring SPO2 with pulse oximeter and return demonstration;Exhibits proper breathing techniques, such as pursed lip breathing or other method taught during program session    Comments no change    Goals/Expected Outcomes Compliance and understanding of oxygen saturation monitoring and breathing techniques to decrease shortness of breath.             Initial Exercise Prescription:  Initial Exercise Prescription - 11/16/22 1200       Date of Initial Exercise RX and Referring Provider   Date 11/16/22    Referring Provider Custovic    Expected Discharge Date 11/16/22      Recumbant Bike   Level 1    RPM 70    Minutes 15      NuStep   Level 1    SPM 70    Minutes 15      Prescription Details   Frequency (times per week) 2    Duration Progress to 30 minutes of continuous aerobic without signs/symptoms of physical distress      Intensity   THRR 40-80% of Max Heartrate 53-106    Ratings of Perceived Exertion 11-13    Perceived Dyspnea 0-4      Progression   Progression Continue progressive overload as per policy without signs/symptoms or physical distress.      Resistance Training   Training  Prescription Yes    Weight red bands    Reps 10-15             Perform Capillary Blood Glucose checks as needed.  Exercise Prescription Changes:   Exercise Prescription Changes     Row Name 12/11/22 1500 12/25/22 1500           Response to Exercise   Blood Pressure (Admit) 104/62 144/60      Blood Pressure (Exercise) 140/68 158/70  Blood Pressure (Exit) 118/60 120/64      Heart Rate (Admit) 83 bpm 68 bpm      Heart Rate (Exercise) 97 bpm 101 bpm      Heart Rate (Exit) 80 bpm 80 bpm      Oxygen Saturation (Admit) 96 % 99 %      Oxygen Saturation (Exercise) 97 % 98 %      Oxygen Saturation (Exit) 97 % 98 %      Rating of Perceived Exertion (Exercise) 13 13      Perceived Dyspnea (Exercise) 1.5 1      Duration Continue with 30 min of aerobic exercise without signs/symptoms of physical distress. Continue with 30 min of aerobic exercise without signs/symptoms of physical distress.      Intensity THRR unchanged THRR unchanged        Progression   Progression Continue to progress workloads to maintain intensity without signs/symptoms of physical distress. Continue to progress workloads to maintain intensity without signs/symptoms of physical distress.        Resistance Training   Training Prescription Yes Yes      Weight Red Bands Red Bands      Reps 10-15 10-15      Time 10 Minutes 10 Minutes        Recumbant Bike   Level 1 1      RPM 70 70      Minutes 15 15      METs 2 2.7        NuStep   Level 4 4      SPM 70 70      Minutes 15 15      METs 2 2.5               Exercise Comments:   Exercise Comments     Row Name 11/22/22 1532 12/18/22 1446         Exercise Comments Pt completed first day of exercise. Shannon Bowman exercised for 15 min on the recumbent bike and Nustep. She averaged 2.0 METs at level 1 on the recumbent bike and 2.0 METs at level 2 on the Nustep. She performed the warmup and cooldown standing without limitations. Discussed METs and how to  increase METs Discussed with pt home exercise plan. She is currently not exercising, just active. Discussed adding one nonrehab day a week. She is interested in returning to the [facility] and doing exercise like she does in [place]. She could also walk on her street, building up to 30 min. Pt agreeable.               Exercise Goals and Review:   Exercise Goals     Row Name 11/16/22 1216 11/20/22 1558 12/25/22 0942         Exercise Goals   Increase Physical Activity Yes Yes Yes     Intervention Provide advice, education, support and counseling about physical activity/exercise needs.;Develop an individualized exercise prescription for aerobic and resistive training based on initial evaluation findings, risk stratification, comorbidities and participant's personal goals. Provide advice, education, support and counseling about physical activity/exercise needs.;Develop an individualized exercise prescription for aerobic and resistive training based on initial evaluation findings, risk stratification, comorbidities and participant's personal goals. Provide advice, education, support and counseling about physical activity/exercise needs.;Develop an individualized exercise prescription for aerobic and resistive training based on initial evaluation findings, risk stratification, comorbidities and participant's personal goals.     Expected Outcomes Short Term: Attend rehab on a regular basis  to increase amount of physical activity.;Long Term: Exercising regularly at least 3-5 days a week.;Long Term: Add in home exercise to make exercise part of routine and to increase amount of physical activity. Short Term: Attend rehab on a regular basis to increase amount of physical activity.;Long Term: Exercising regularly at least 3-5 days a week.;Long Term: Add in home exercise to make exercise part of routine and to increase amount of physical activity. Short Term: Attend rehab on a regular basis to increase amount of  physical activity.;Long Term: Exercising regularly at least 3-5 days a week.;Long Term: Add in home exercise to make exercise part of routine and to increase amount of physical activity.     Increase Strength and Stamina Yes Yes Yes     Intervention Provide advice, education, support and counseling about physical activity/exercise needs.;Develop an individualized exercise prescription for aerobic and resistive training based on initial evaluation findings, risk stratification, comorbidities and participant's personal goals. Provide advice, education, support and counseling about physical activity/exercise needs.;Develop an individualized exercise prescription for aerobic and resistive training based on initial evaluation findings, risk stratification, comorbidities and participant's personal goals. Provide advice, education, support and counseling about physical activity/exercise needs.;Develop an individualized exercise prescription for aerobic and resistive training based on initial evaluation findings, risk stratification, comorbidities and participant's personal goals.     Expected Outcomes Short Term: Increase workloads from initial exercise prescription for resistance, speed, and METs.;Short Term: Perform resistance training exercises routinely during rehab and add in resistance training at home;Long Term: Improve cardiorespiratory fitness, muscular endurance and strength as measured by increased METs and functional capacity (6MWT) Short Term: Increase workloads from initial exercise prescription for resistance, speed, and METs.;Short Term: Perform resistance training exercises routinely during rehab and add in resistance training at home;Long Term: Improve cardiorespiratory fitness, muscular endurance and strength as measured by increased METs and functional capacity (6MWT) Short Term: Increase workloads from initial exercise prescription for resistance, speed, and METs.;Short Term: Perform resistance  training exercises routinely during rehab and add in resistance training at home;Long Term: Improve cardiorespiratory fitness, muscular endurance and strength as measured by increased METs and functional capacity (6MWT)     Able to understand and use rate of perceived exertion (RPE) scale Yes Yes Yes     Intervention Provide education and explanation on how to use RPE scale Provide education and explanation on how to use RPE scale Provide education and explanation on how to use RPE scale     Expected Outcomes Short Term: Able to use RPE daily in rehab to express subjective intensity level;Long Term:  Able to use RPE to guide intensity level when exercising independently Short Term: Able to use RPE daily in rehab to express subjective intensity level;Long Term:  Able to use RPE to guide intensity level when exercising independently Short Term: Able to use RPE daily in rehab to express subjective intensity level;Long Term:  Able to use RPE to guide intensity level when exercising independently     Able to understand and use Dyspnea scale Yes Yes Yes     Intervention Provide education and explanation on how to use Dyspnea scale Provide education and explanation on how to use Dyspnea scale Provide education and explanation on how to use Dyspnea scale     Expected Outcomes Short Term: Able to use Dyspnea scale daily in rehab to express subjective sense of shortness of breath during exertion;Long Term: Able to use Dyspnea scale to guide intensity level when exercising independently Short Term: Able to use  Dyspnea scale daily in rehab to express subjective sense of shortness of breath during exertion;Long Term: Able to use Dyspnea scale to guide intensity level when exercising independently Short Term: Able to use Dyspnea scale daily in rehab to express subjective sense of shortness of breath during exertion;Long Term: Able to use Dyspnea scale to guide intensity level when exercising independently     Knowledge and  understanding of Target Heart Rate Range (THRR) Yes Yes Yes     Intervention Provide education and explanation of THRR including how the numbers were predicted and where they are located for reference Provide education and explanation of THRR including how the numbers were predicted and where they are located for reference Provide education and explanation of THRR including how the numbers were predicted and where they are located for reference     Expected Outcomes Short Term: Able to state/look up THRR;Long Term: Able to use THRR to govern intensity when exercising independently;Short Term: Able to use daily as guideline for intensity in rehab Short Term: Able to state/look up THRR;Long Term: Able to use THRR to govern intensity when exercising independently;Short Term: Able to use daily as guideline for intensity in rehab Short Term: Able to state/look up THRR;Long Term: Able to use THRR to govern intensity when exercising independently;Short Term: Able to use daily as guideline for intensity in rehab     Understanding of Exercise Prescription Yes Yes Yes     Intervention Provide education, explanation, and written materials on patient's individual exercise prescription Provide education, explanation, and written materials on patient's individual exercise prescription Provide education, explanation, and written materials on patient's individual exercise prescription     Expected Outcomes Short Term: Able to explain program exercise prescription;Long Term: Able to explain home exercise prescription to exercise independently Short Term: Able to explain program exercise prescription;Long Term: Able to explain home exercise prescription to exercise independently Short Term: Able to explain program exercise prescription;Long Term: Able to explain home exercise prescription to exercise independently              Exercise Goals Re-Evaluation :  Exercise Goals Re-Evaluation     Row Name 11/26/22 1448  12/25/22 0943           Exercise Goal Re-Evaluation   Exercise Goals Review Increase Physical Activity;Able to understand and use Dyspnea scale;Understanding of Exercise Prescription;Increase Strength and Stamina;Knowledge and understanding of Target Heart Rate Range (THRR);Able to understand and use rate of perceived exertion (RPE) scale Increase Physical Activity;Able to understand and use Dyspnea scale;Understanding of Exercise Prescription;Increase Strength and Stamina;Knowledge and understanding of Target Heart Rate Range (THRR);Able to understand and use rate of perceived exertion (RPE) scale      Comments Pt has had one exercise session, which she tolerated well, averaging 2.0 METs on the recumbent bike and Nustep. It is too early to discern any progressions. Will continue to monitor. Pt has had 9 exercise sessions. She has not missed. She is exercising 15 min on the recumbent bike and Nustep. She is on level 2 on the recumbent bike, METs 2.6. She is then exercising on the nustep at level 4, METs 2.1. She is progressing well.      Expected Outcomes Through exercise at rehab and home, the patient will decrease shortness of breath with daily activities and feel confident in carrying out an exercise regimen at home. Through exercise at rehab and home, the patient will decrease shortness of breath with daily activities and feel confident in carrying out an  exercise regimen at home.               Discharge Exercise Prescription (Final Exercise Prescription Changes):  Exercise Prescription Changes - 12/25/22 1500       Response to Exercise   Blood Pressure (Admit) 144/60    Blood Pressure (Exercise) 158/70    Blood Pressure (Exit) 120/64    Heart Rate (Admit) 68 bpm    Heart Rate (Exercise) 101 bpm    Heart Rate (Exit) 80 bpm    Oxygen Saturation (Admit) 99 %    Oxygen Saturation (Exercise) 98 %    Oxygen Saturation (Exit) 98 %    Rating of Perceived Exertion (Exercise) 13     Perceived Dyspnea (Exercise) 1    Duration Continue with 30 min of aerobic exercise without signs/symptoms of physical distress.    Intensity THRR unchanged      Progression   Progression Continue to progress workloads to maintain intensity without signs/symptoms of physical distress.      Resistance Training   Training Prescription Yes    Weight Red Bands    Reps 10-15    Time 10 Minutes      Recumbant Bike   Level 1    RPM 70    Minutes 15    METs 2.7      NuStep   Level 4    SPM 70    Minutes 15    METs 2.5             Nutrition:  Target Goals: Understanding of nutrition guidelines, daily intake of sodium '1500mg'$ , cholesterol '200mg'$ , calories 30% from fat and 7% or less from saturated fats, daily to have 5 or more servings of fruits and vegetables.  Biometrics:  Pre Biometrics - 11/16/22 1217       Pre Biometrics   Grip Strength 18 kg              Nutrition Therapy Plan and Nutrition Goals:  Nutrition Therapy & Goals - 12/20/22 1402       Nutrition Therapy   Diet Heart Healthy diet    Drug/Food Interactions Statins/Certain [employer]      Personal Nutrition Goals   Nutrition Goal Patient to continue to use the plate method as a daily guide for meal planning to include lean protein/plant protein, fruits, vegetables whole grains, low fat dairy as part of well balanced diet.    Comments Shannon Bowman lives alone as her husband passed away 1.5 years ago. She reports no nutrition concerns at this time. She reports eating a wide variety of foods and eating 2-3 meals daily. Her daughters are very supportive and provide many meals for her. She continues regular follow-up with cardiology and does weigh daily. Her losartan/hctz was stopped on 08/14/22 due to low sodium concerns; she continues amlodipine and mindfulness of sodium intake. She continues regular follow-up with pulmonary and PCP.      Intervention Plan   Intervention Prescribe, educate and counsel regarding  individualized specific dietary modifications aiming towards targeted core components such as weight, hypertension, lipid management, diabetes, heart failure and other comorbidities.;Nutrition handout(s) given to patient.    Expected Outcomes Short Term Goal: Understand basic principles of dietary content, such as calories, fat, sodium, cholesterol and nutrients.;Long Term Goal: Adherence to prescribed nutrition plan.             Nutrition Assessments:  Nutrition Assessments - 11/22/22 1601       Rate Your Plate Scores   Pre  Score 57            MEDIFICTS Score Key: ?70 Need to make dietary changes  40-70 Heart Healthy Diet ? 40 Therapeutic Level Cholesterol Diet  Flowsheet Row PULMONARY REHAB OTHER RESPIRATORY from 11/22/2022 in [facility] for Heart, Vascular, & Lung Health  Picture Your Plate Total Score on Admission 57      Picture Your Plate Scores: <37 Unhealthy dietary pattern with much room for improvement. 41-50 Dietary pattern unlikely to meet recommendations for good health and room for improvement. 51-60 More healthful dietary pattern, with some room for improvement.  >60 Healthy dietary pattern, although there may be some specific behaviors that could be improved.    Nutrition Goals Re-Evaluation:  Nutrition Goals Re-Evaluation     Shannon Bowman Name 11/22/22 1549 12/20/22 1402           Goals   Current Weight 130 lb 15.3 oz (59.4 kg) 127 lb 3.3 oz (57.7 kg)      Comment Lipids WNL, LDL 118 no new labs at this time; Lipids WNL, LDL 118      Expected Outcome Shannon Bowman lives alone as her husband passed away 1.5 years ago. She reports no nutrition concerns at this time. She reports eating a wide variety of foods, eating 2-3 meals daily, and no changes to appetite. Her daughters are very supportive and provide many meals for her. She continues regular follow-up with cardiology and does weigh daily. Her losartan/hctz was stopped on 08/14/22 due to low  sodium concerns. Patient will benefit from continued attendance to pulmonary rehab. Goals in action. Shannon Bowman lives alone as her husband passed away 1.5 years ago. She reports no nutrition concerns at this time. She reports eating a wide variety of foods and eating 2-3 meals daily. She is down 3.5# since starting with our program.  Her daughters are very supportive and provide many meals for her. She continues regular follow-up with cardiology and does weigh daily. Her losartan/hctz was stopped on 08/14/22 due to low sodium concerns; she continues amlodipine and mindfulness of sodium intake. She continues regular follow-up with pulmonary and PCP.               Nutrition Goals Discharge (Final Nutrition Goals Re-Evaluation):  Nutrition Goals Re-Evaluation - 12/20/22 1402       Goals   Current Weight 127 lb 3.3 oz (57.7 kg)    Comment no new labs at this time; Lipids WNL, LDL 118    Expected Outcome Goals in action. Tashawn lives alone as her husband passed away 1.5 years ago. She reports no nutrition concerns at this time. She reports eating a wide variety of foods and eating 2-3 meals daily. She is down 3.5# since starting with our program.  Her daughters are very supportive and provide many meals for her. She continues regular follow-up with cardiology and does weigh daily. Her losartan/hctz was stopped on 08/14/22 due to low sodium concerns; she continues amlodipine and mindfulness of sodium intake. She continues regular follow-up with pulmonary and PCP.             Psychosocial: Target Goals: Acknowledge presence or absence of significant depression and/or stress, maximize coping skills, provide positive support system. Participant is able to verbalize types and ability to use techniques and skills needed for reducing stress and depression.  Initial Review & Psychosocial Screening:  Initial Psych Review & Screening - 11/16/22 1108       Initial Review   Current issues  with None Identified       Family Dynamics   Good Support System? Yes    Comments Pt states she and dgt go to lunch and shopping almost everyday      Barriers   Psychosocial barriers to participate in program There are no identifiable barriers or psychosocial needs.      Screening Interventions   Interventions Encouraged to exercise    Expected Outcomes Short Term goal: Utilizing psychosocial counselor, staff and physician to assist with identification of specific Stressors or current issues interfering with healing process. Setting desired goal for each stressor or current issue identified.;Long Term Goal: Stressors or current issues are controlled or eliminated.;Short Term goal: Identification and review with participant of any Quality of Life or Depression concerns found by scoring the questionnaire.;Long Term goal: The participant improves quality of Life and PHQ9 Scores as seen by post scores and/or verbalization of changes             Quality of Life Scores:  Scores of 19 and below usually indicate a poorer quality of life in these areas.  A difference of  2-3 points is a clinically meaningful difference.  A difference of 2-3 points in the total score of the Quality of Life Index has been associated with significant improvement in overall quality of life, self-image, physical symptoms, and general health in studies assessing change in quality of life.  PHQ-9: Review Flowsheet       11/16/2022  Depression screen PHQ 2/9  Decreased Interest 0  Down, Depressed, Hopeless 0  PHQ - 2 Score 0  Altered sleeping 0  Tired, decreased energy 0  Change in appetite 0  Feeling bad or failure about yourself  0  Trouble concentrating 0  Moving slowly or fidgety/restless 0  Suicidal thoughts 0  PHQ-9 Score 0   Interpretation of Total Score  Total Score Depression Severity:  1-4 = Minimal depression, 5-9 = Mild depression, 10-14 = Moderate depression, 15-19 = Moderately severe depression, 20-27 = Severe  depression   Psychosocial Evaluation and Intervention:  Psychosocial Evaluation - 11/16/22 1110       Psychosocial Evaluation & Interventions   Interventions Encouraged to exercise with the program and follow exercise prescription    Comments Pt denies any psychosocial barriers to the PR program    Expected Outcomes Pt will exercise and benefit from [place]  No Follow up required             Psychosocial Re-Evaluation:  Psychosocial Re-Evaluation     Whigham Name 11/22/22 1631 12/25/22 1962           Psychosocial Re-Evaluation   Current issues with None Identified None Identified      Comments Shannon Bowman started in the PR program today. we will continue to moniotr for any psychosocial barriers. Shannon Bowman has attended 9 PR classes so far. She denies any psychosocial barriers or concerns. Shannon Bowman states that she has good support from her daughter. We will continue to monitor for any psychosocial barriers or concerns.      Expected Outcomes For her to participate in the program without any psychosocial issues or concerns. For Shannon Bowman to participate in the program without any psychosocial issues or concerns.      Interventions -- Encouraged to attend Pulmonary Rehabilitation for the exercise      Continue Psychosocial Services  No Follow up required No Follow up required  Psychosocial Discharge (Final Psychosocial Re-Evaluation):  Psychosocial Re-Evaluation - 12/25/22 8786       Psychosocial Re-Evaluation   Current issues with None Identified    Comments Shannon Bowman has attended 9 PR classes so far. She denies any psychosocial barriers or concerns. Shannon Bowman states that she has good support from her daughter. We will continue to monitor for any psychosocial barriers or concerns.    Expected Outcomes For Shannon Bowman to participate in the program without any psychosocial issues or concerns.    Interventions Encouraged to attend Pulmonary Rehabilitation for the  exercise    Continue Psychosocial Services  No Follow up required             Education: Education Goals: Education classes will be provided on a weekly basis, covering required topics. Participant will state understanding/return demonstration of topics presented.  Learning Barriers/Preferences:  Learning Barriers/Preferences - 11/16/22 1111       Learning Barriers/Preferences   Learning Barriers Hearing    Learning Preferences Verbal Instruction;Group Instruction;Individual Instruction             Education Topics: Introduction to Pulmonary Rehab Group instruction provided by PowerPoint, verbal discussion, and written material to support subject matter. Instructor reviews what Pulmonary Rehab is, the purpose of the program, and how patients are referred.     Know Your Numbers Group instruction that is supported by a PowerPoint presentation. Instructor discusses importance of knowing and understanding resting, exercise, and post-exercise oxygen saturation, heart rate, and blood pressure. Oxygen saturation, heart rate, blood pressure, rating of perceived exertion, and dyspnea are reviewed along with a normal range for these values.    Exercise for the Pulmonary Patient Group instruction that is supported by a PowerPoint presentation. Instructor discusses benefits of exercise, core components of exercise, frequency, duration, and intensity of an exercise routine, importance of utilizing pulse oximetry during exercise, safety while exercising, and options of places to exercise outside of rehab.  Flowsheet Row PULMONARY REHAB OTHER RESPIRATORY from 12/13/2022 in [facility] for Heart, Vascular, & Lung Health  Date 12/06/22  Educator EP  Instruction Review Code 1- Verbalizes Understanding  [took handout]          MET Level  Group instruction provided by PowerPoint, verbal discussion, and written material to support subject matter. Instructor reviews  what METs are and how to increase METs.    Pulmonary Medications Verbally interactive group education provided by instructor with focus on inhaled medications and proper administration. Flowsheet Row PULMONARY REHAB OTHER RESPIRATORY from 12/20/2022 in [facility] for Heart, Vascular, & [place]  Date 12/20/22  Educator Ep  Instruction Review Code 1- Verbalizes Understanding       Anatomy and Physiology of the Respiratory System Group instruction provided by PowerPoint, verbal discussion, and written material to support subject matter. Instructor reviews respiratory cycle and anatomical components of the respiratory system and their functions. Instructor also reviews differences in obstructive and restrictive respiratory diseases with examples of each.  Flowsheet Row PULMONARY REHAB OTHER RESPIRATORY from 12/13/2022 in [facility] for Heart, Vascular, & [place]  Date 12/13/22  Educator Kelle Darting R.  Instruction Review Code 1- Verbalizes Understanding       Oxygen Safety Group instruction provided by PowerPoint, verbal discussion, and written material to support subject matter. There is an overview of "What is Oxygen" and "Why do we need it".  Instructor also reviews how to create a safe environment for oxygen use, the importance of  using oxygen as prescribed, and the risks of noncompliance. There is a brief discussion on traveling with oxygen and resources the patient may utilize.   Oxygen Use Group instruction provided by PowerPoint, verbal discussion, and written material to discuss how supplemental oxygen is prescribed and different types of oxygen supply systems. Resources for more information are provided.    Breathing Techniques Group instruction that is supported by demonstration and informational handouts. Instructor discusses the benefits of pursed lip and diaphragmatic breathing and detailed demonstration on how to perform  both.     Risk Factor Reduction Group instruction that is supported by a PowerPoint presentation. Instructor discusses the definition of a risk factor, different risk factors for pulmonary disease, and how the heart and lungs work together. Flowsheet Row PULMONARY REHAB OTHER RESPIRATORY from 12/13/2022 in [facility] for Heart, Vascular, & [place]  Date 11/22/22  Educator EP  Instruction Review Code 1- Verbalizes Understanding       MD Day A group question and answer session with a medical doctor that allows participants to ask questions that relate to their pulmonary disease state.   Nutrition for the Pulmonary Patient Group instruction provided by PowerPoint slides, verbal discussion, and written materials to support subject matter. The instructor gives an explanation and review of healthy diet recommendations, which includes a discussion on weight management, recommendations for fruit and vegetable consumption, as well as protein, fluid, caffeine, fiber, sodium, sugar, and alcohol. Tips for eating when patients are short of breath are discussed.    Other Education Group or individual verbal, written, or video instructions that support the educational goals of the pulmonary rehab program.    Knowledge Questionnaire Score:  Knowledge Questionnaire Score - 11/16/22 1117       Knowledge Questionnaire Score   Pre Score 14/18             Core Components/Risk Factors/Patient Goals at Admission:  Personal Goals and Risk Factors at Admission - 11/16/22 1111       Core Components/Risk Factors/Patient Goals on Admission    Weight Management Weight Maintenance    Improve shortness of breath with ADL's Yes    Intervention Provide education, individualized exercise plan and daily activity instruction to help decrease symptoms of SOB with activities of daily living.    Expected Outcomes Short Term: Improve cardiorespiratory fitness to achieve a reduction  of symptoms when performing ADLs             Core Components/Risk Factors/Patient Goals Review:   Goals and Risk Factor Review     Row Name 11/27/22 0858 12/25/22 0835           Core Components/Risk Factors/Patient Goals Review   Personal Goals Review Weight Management/Obesity;Develop more efficient breathing techniques such as purse lipped breathing and diaphragmatic breathing and practicing self-pacing with activity.;Improve shortness of breath with ADL's;Increase knowledge of respiratory medications and ability to use respiratory devices properly. Weight Management/Obesity;Develop more efficient breathing techniques such as purse lipped breathing and diaphragmatic breathing and practicing self-pacing with activity.;Improve shortness of breath with ADL's;Increase knowledge of respiratory medications and ability to use respiratory devices properly.      Review Shannon Bowman has completed 1 exercise session. It is too soon to note any progresssions. Will continue to monitor. Shannon Bowman has completed 9 exercise session. She exercises on the recumbent bike and the NuStep. She is making progress in improving her intensity and METs. Her METs has gone from 2 to 2.6 on the recumbent bike and  2 to 2.9 on the NuStep. Shannon Bowman can verbalize her dyspnea scale as well as her rate of perceived exertion. She can demonstrate pursed lip breathing when she is short of breath. Shannon Bowman has also attended education classes on anatomy and physiology and exercise for the pulmonary patient. Shannon Bowman enjoys coming to class and socializing with her peers.      Expected Outcomes See admission goals. See admission goals               Core Components/Risk Factors/Patient Goals at Discharge (Final Review):   Goals and Risk Factor Review - 12/25/22 0835       Core Components/Risk Factors/Patient Goals Review   Personal Goals Review Weight Management/Obesity;Develop more efficient breathing techniques such as purse lipped  breathing and diaphragmatic breathing and practicing self-pacing with activity.;Improve shortness of breath with ADL's;Increase knowledge of respiratory medications and ability to use respiratory devices properly.    Review Shannon Bowman has completed 9 exercise session. She exercises on the recumbent bike and the NuStep. She is making progress in improving her intensity and METs. Her METs has gone from 2 to 2.6 on the recumbent bike and 2 to 2.9 on the NuStep. Shannon Bowman can verbalize her dyspnea scale as well as her rate of perceived exertion. She can demonstrate pursed lip breathing when she is short of breath. Shannon Bowman has also attended education classes on anatomy and physiology and exercise for the pulmonary patient. Shannon Bowman enjoys coming to class and socializing with her peers.    Expected Outcomes See admission goals             ITP Comments:   Comments: Pt is making expected progress toward Pulmonary Rehab goals after completing 10 sessions. Recommend continued exercise, life style modification, education, and utilization of breathing techniques to increase stamina and strength, while also decreasing shortness of breath with exertion.  Dr. Rodman Pickle is Medical Director for Pulmonary Rehab at [facility].

## 2022-12-27 ENCOUNTER — Ambulatory Visit (HOSPITAL_COMMUNITY): Payer: Medicare Other

## 2022-12-27 ENCOUNTER — Encounter (HOSPITAL_COMMUNITY)
Admission: RE | Admit: 2022-12-27 | Discharge: 2022-12-27 | Disposition: A | Discharge status: Home / Self Care | Payer: Medicare Other | Source: Ambulatory Visit | Attending: Internal Medicine | Admitting: Internal Medicine

## 2022-12-27 ENCOUNTER — Ambulatory Visit: Payer: Medicare Other | Admitting: Internal Medicine

## 2022-12-27 DIAGNOSIS — J410 Simple chronic bronchitis: Secondary | ICD-10-CM

## 2022-12-27 NOTE — Progress Notes (Signed)
Daily Session Note  Patient Details  Name: Shannon Bowman MRN: [id number] Date of Birth: [birth year] Referring Provider:   April Manson Pulmonary Rehab Walk Test from 11/16/2022 in [facility] for Heart, Vascular, & Lung Health  Referring Provider Custovic       Encounter Date: 12/27/2022  Check In:  Session Check In - 12/27/22 1539       Check-In   Physician(s) --    Location --    Staff Present --    Virtual Visit --    Medication changes reported     --    Fall or balance concerns reported    --    Tobacco Cessation --    Warm-up and Cool-down --    Resistance Training Performed --    VAD Patient? --             Capillary Blood Glucose: No results found for this or any previous visit (from the past 24 hour(s)).    Social History   Tobacco Use  Smoking Status Former   Packs/day: 0.10   Years: 3.00   Total pack years: 0.30   Types: Cigarettes   Quit date: 12/11/1963   Years since quitting: 59.0  Smokeless Tobacco Never    Goals Met:  Improved SOB with ADL's Exercise tolerated well No report of concerns or symptoms today Strength training completed today  Goals Unmet:  Not Applicable  Comments: Service time is from 1325 to [place]  Dr. Rodman Pickle is Medical Director for Pulmonary Rehab at [facility].

## 2023-01-01 ENCOUNTER — Encounter (HOSPITAL_COMMUNITY)
Admission: RE | Admit: 2023-01-01 | Discharge: 2023-01-01 | Disposition: A | Discharge status: Home / Self Care | Payer: Medicare Other | Source: Ambulatory Visit | Attending: Internal Medicine | Admitting: Internal Medicine

## 2023-01-01 ENCOUNTER — Telehealth (HOSPITAL_COMMUNITY): Payer: Self-pay

## 2023-01-01 ENCOUNTER — Ambulatory Visit (HOSPITAL_COMMUNITY): Payer: Medicare Other

## 2023-01-01 DIAGNOSIS — J410 Simple chronic bronchitis: Secondary | ICD-10-CM | POA: Diagnosis not present

## 2023-01-01 NOTE — Telephone Encounter (Signed)
Pt insurance is active and benefits verified through [facility] Medicare Co-pay 0, DED 0/0 met, out of pocket 0/0 met, co-insurance 0%. no pre-authorization required, [place]/[employer] 01/01/2023'@1'$ :30pm, REF# [id number]

## 2023-01-01 NOTE — Progress Notes (Signed)
Daily Session Note  Patient Details  Name: Shannon Bowman MRN: [id number] Date of Birth: Dec 13, 1931 Referring Provider:   April Manson Pulmonary Rehab Walk Test from 11/16/2022 in [facility] for Heart, Vascular, & [place]  Referring Provider Custovic       Encounter Date: 01/01/2023  Check In:  Session Check In - 01/01/23 1420       Check-In   Supervising physician immediately available to respond to emergencies [facility] MD immediately available    Physician(s) Anselm Lis, NP    Location [facility]    Staff Present Elmon Else, MS, ACSM-CEP, Exercise Physiologist;Mary Margette Fast, RN, BSN;Randi Reeve BS, ACSM-CEP, Exercise Physiologist;Other    Virtual Visit No    Medication changes reported     No    Fall or balance concerns reported    No    Tobacco Cessation No Change    Warm-up and Cool-down Performed as group-led instruction    Resistance Training Performed Yes    VAD Patient? No      Pain Assessment   Currently in Pain? No/denies    Multiple Pain Sites No             Capillary Blood Glucose: No results found for this or any previous visit (from the past 24 hour(s)).    Social History   Tobacco Use  Smoking Status Former   Packs/day: 0.10   Years: 3.00   Total pack years: 0.30   Types: Cigarettes   Quit date: 12/11/1963   Years since quitting: 59.0  Smokeless Tobacco Never    Goals Met:  Proper associated with RPD/PD & O2 Sat Independence with exercise equipment Exercise tolerated well No report of concerns or symptoms today Strength training completed today  Goals Unmet:  Not Applicable  Comments: Service time is from 1324 to 1444.    Dr. Rodman Pickle is Medical Director for Pulmonary Rehab at [facility].

## 2023-01-03 ENCOUNTER — Ambulatory Visit (HOSPITAL_COMMUNITY): Payer: Medicare Other

## 2023-01-03 ENCOUNTER — Encounter (HOSPITAL_COMMUNITY)
Admission: RE | Admit: 2023-01-03 | Discharge: 2023-01-03 | Disposition: A | Discharge status: Home / Self Care | Payer: Medicare Other | Source: Ambulatory Visit | Attending: Internal Medicine | Admitting: Internal Medicine

## 2023-01-03 DIAGNOSIS — J410 Simple chronic bronchitis: Secondary | ICD-10-CM

## 2023-01-03 NOTE — Progress Notes (Signed)
Daily Session Note  Patient Details  Name: Shannon Bowman MRN: [id number] Date of Birth: [birth year] Referring Provider:   April Manson Pulmonary Rehab Walk Test from 11/16/2022 in [facility] for Heart, Vascular, & Lung Health  Referring Provider Custovic       Encounter Date: 01/03/2023  Check In:  Session Check In - 01/03/23 1322       Check-In   Supervising physician immediately available to respond to emergencies [facility] MD immediately available    Physician(s) Dr. Marshell Garfinkel    Location [facility]    Staff Present Janine Ores, RN, Quentin Ore, MS, ACSM-CEP, Exercise Physiologist;Randi Yevonne Pax, ACSM-CEP, Exercise Physiologist;Other    Virtual Visit No    Medication changes reported     No    Fall or balance concerns reported    No    Tobacco Cessation No Change    Warm-up and Cool-down Performed as group-led instruction    Resistance Training Performed Yes    VAD Patient? No    PAD/SET Patient? No      Pain Assessment   Currently in Pain? No/denies    Pain Score 0-No pain    Multiple Pain Sites No             Capillary Blood Glucose: No results found for this or any previous visit (from the past 24 hour(s)).    Social History   Tobacco Use  Smoking Status Former   Packs/day: 0.10   Years: 3.00   Total pack years: 0.30   Types: Cigarettes   Quit date: 12/11/1963   Years since quitting: 59.1  Smokeless Tobacco Never    Goals Met:  Proper associated with RPD/PD & O2 Sat Independence with exercise equipment Exercise tolerated well Strength training completed today  Goals Unmet:  Not Applicable  Comments: Service time is from 1309 to 1440.    Dr. Rodman Pickle is Medical Director for Pulmonary Rehab at [facility].

## 2023-01-07 ENCOUNTER — Encounter (INDEPENDENT_AMBULATORY_CARE_PROVIDER_SITE_OTHER): Payer: Medicare Other | Admitting: Ophthalmology

## 2023-01-08 ENCOUNTER — Ambulatory Visit (HOSPITAL_COMMUNITY): Payer: Medicare Other

## 2023-01-08 ENCOUNTER — Encounter (HOSPITAL_COMMUNITY)
Admission: RE | Admit: 2023-01-08 | Discharge: 2023-01-08 | Disposition: A | Discharge status: Home / Self Care | Payer: Medicare Other | Source: Ambulatory Visit | Attending: Internal Medicine | Admitting: Internal Medicine

## 2023-01-08 VITALS — Wt 127.4 lb

## 2023-01-08 DIAGNOSIS — J410 Simple chronic bronchitis: Secondary | ICD-10-CM

## 2023-01-08 NOTE — Progress Notes (Signed)
Daily Session Note  Patient Details  Name: Shannon Bowman MRN: [id number] Date of Birth: 04/28/1932 Referring Provider:   April Manson Pulmonary Rehab Walk Test from 11/16/2022 in [facility]  Referring Provider Custovic       Encounter Date: 01/08/2023  Check In:  Session Check In - 01/08/23 1339       Check-In   Physician(s) Dr. Marshell Garfinkel    Location [facility]    Staff Present Janine Ores, RN, Quentin Ore, MS, ACSM-CEP, Exercise Physiologist;Randi Yevonne Pax, ACSM-CEP, Exercise Physiologist;Other    Virtual Visit No    Medication changes reported     No    Fall or balance concerns reported    No    Tobacco Cessation No Change    Warm-up and Cool-down Performed as group-led instruction    Resistance Training Performed Yes    VAD Patient? No    PAD/SET Patient? No      Pain Assessment   Currently in Pain? No/denies    Pain Score 0-No pain    Multiple Pain Sites No             Capillary Blood Glucose: No results found for this or any previous visit (from the past 24 hour(s)).   Exercise Prescription Changes - 01/08/23 1500       Response to Exercise   Blood Pressure (Admit) 142/76    Blood Pressure (Exercise) 146/70    Blood Pressure (Exit) 128/70    Heart Rate (Admit) 79 bpm    Heart Rate (Exercise) 105 bpm    Heart Rate (Exit) 86 bpm    Oxygen Saturation (Admit) 96 %    Oxygen Saturation (Exercise) 95 %    Oxygen Saturation (Exit) 96 %    Rating of Perceived Exertion (Exercise) 13    Perceived Dyspnea (Exercise) 1    Duration Continue with 30 min of aerobic exercise without signs/symptoms of physical distress.    Intensity THRR unchanged      Progression   Progression Continue to progress workloads to maintain intensity without signs/symptoms of physical distress.      Resistance Training   Training Prescription Yes    Weight Red bands    Reps 10-15    Time 10  Minutes      NuStep   Level 5    SPM 70    Minutes 15    METs 3.6      Recumbant Elliptical   Level 3    RPM 70    Minutes 15    METs 4.7             Social History   Tobacco Use  Smoking Status Former   Packs/day: 0.10   Years: 3.00   Total pack years: 0.30   Types: Cigarettes   Quit date: 12/11/1963   Years since quitting: 59.1  Smokeless Tobacco Never    Goals Met:  Proper associated with RPD/PD & O2 Sat Independence with exercise equipment Exercise tolerated well No report of concerns or symptoms today Strength training completed today  Goals Unmet:  Not Applicable  Comments: Service time is from 1328 to 1448.    Dr. Rodman Pickle is Medical Director for Pulmonary Rehab at [facility].

## 2023-01-10 ENCOUNTER — Encounter (HOSPITAL_COMMUNITY)
Admission: RE | Admit: 2023-01-10 | Discharge: 2023-01-10 | Disposition: A | Discharge status: Home / Self Care | Payer: Medicare Other | Source: Ambulatory Visit | Attending: Internal Medicine | Admitting: Internal Medicine

## 2023-01-10 ENCOUNTER — Ambulatory Visit (HOSPITAL_COMMUNITY): Payer: Medicare Other

## 2023-01-10 DIAGNOSIS — J410 Simple chronic bronchitis: Secondary | ICD-10-CM | POA: Insufficient documentation

## 2023-01-10 DIAGNOSIS — J40 Bronchitis, not specified as acute or chronic: Secondary | ICD-10-CM | POA: Diagnosis present

## 2023-01-15 ENCOUNTER — Encounter (HOSPITAL_COMMUNITY)
Admission: RE | Admit: 2023-01-15 | Discharge: 2023-01-15 | Disposition: A | Discharge status: Home / Self Care | Payer: Medicare Other | Source: Ambulatory Visit | Attending: Internal Medicine | Admitting: Internal Medicine

## 2023-01-15 DIAGNOSIS — J410 Simple chronic bronchitis: Secondary | ICD-10-CM

## 2023-01-15 NOTE — Progress Notes (Signed)
Daily Session Note  Patient Details  Name: Shannon Bowman MRN: [id number] Date of Birth: 09-17-1932 Referring Provider:   April Manson Pulmonary Rehab Walk Test from 11/16/[facility] in [facility]  Referring Provider Custovic       Encounter Date: 01/15/2023  Check In:  Session Check In - 01/15/23 1426       Check-In   Supervising physician immediately available to respond to emergencies [facility] MD immediately available    Physician(s) Clung    Location [facility]    Staff Present Janine Ores, RN, Quentin Ore, MS, ACSM-CEP, Exercise Physiologist;Randi Yevonne Pax, ACSM-CEP, Exercise Physiologist;Other    Virtual Visit No    Medication changes reported     Yes    Comments started crestor '20mg'$     Fall or balance concerns reported    Yes    Comments Pt stated she fell last Thursday after class    Tobacco Cessation No Change    Warm-up and Cool-down Performed as group-led instruction    Resistance Training Performed Yes    VAD Patient? No    PAD/SET Patient? No      Pain Assessment   Currently in Pain? No/denies    Pain Score 0-No pain    Multiple Pain Sites No             Capillary Blood Glucose: No results found for this or any previous visit (from the past 24 hour(s)).    Social History   Tobacco Use  Smoking Status Former   Packs/day: 0.10   Years: 3.00   Total pack years: 0.30   Types: Cigarettes   Quit date: 12/11/1963   Years since quitting: 59.1  Smokeless Tobacco Never    Goals Met:  Independence with exercise equipment Exercise tolerated well Queuing for purse lip breathing No report of concerns or symptoms today Strength training completed today  Goals Unmet:  Not Applicable  Comments: Pt's post exercise heart rate was 108. Pt usually runs in the 80s. EKG checked and pt in ST. Pt stated she was anxious today about driving her new car.   Service time is from 1306 to  [place]    Dr. Rodman Pickle is Medical Director for Pulmonary Rehab at [facility].

## 2023-01-17 ENCOUNTER — Encounter (HOSPITAL_COMMUNITY)
Admission: RE | Admit: 2023-01-17 | Discharge: 2023-01-17 | Disposition: A | Discharge status: Home / Self Care | Payer: Medicare Other | Source: Ambulatory Visit | Attending: Internal Medicine | Admitting: Internal Medicine

## 2023-01-17 DIAGNOSIS — J410 Simple chronic bronchitis: Secondary | ICD-10-CM | POA: Diagnosis not present

## 2023-01-17 NOTE — Progress Notes (Signed)
Daily Session Note  Patient Details  Name: Shannon Bowman MRN: [id number] Date of Birth: Apr 01, 1932 Referring Provider:   April Manson Pulmonary Rehab Walk Test from 11/16/2022 in [facility]  Referring Provider Custovic       Encounter Date: 01/17/2023  Check In:  Session Check In - 01/17/23 1435       Check-In   Supervising physician immediately available to respond to emergencies [facility] MD immediately available    Physician(s) Dr Vaughan Browner    Location [facility]    Staff Present Janine Ores, RN, Quentin Ore, MS, ACSM-CEP, Exercise Physiologist;Samantha [place], RD, LDN;Other;Jetta Gilford Rile BS, ACSM-CEP, Exercise Physiologist    Virtual Visit No    Medication changes reported     No    Fall or balance concerns reported    No    Tobacco Cessation No Change    Warm-up and Cool-down Performed as group-led instruction    Resistance Training Performed Yes    VAD Patient? No    PAD/SET Patient? No      Pain Assessment   Currently in Pain? No/denies    Multiple Pain Sites No             Capillary Blood Glucose: No results found for this or any previous visit (from the past 24 hour(s)).    Social History   Tobacco Use  Smoking Status Former   Packs/day: 0.10   Years: 3.00   Total pack years: 0.30   Types: Cigarettes   Quit date: 12/11/1963   Years since quitting: 59.1  Smokeless Tobacco Never    Goals Met:  Independence with exercise equipment Exercise tolerated well No report of concerns or symptoms today Strength training completed today  Goals Unmet:  Not Applicable  Comments: Service time is from 1321 to 1440    Dr. Rodman Pickle is Medical Director for Pulmonary Rehab at [facility].

## 2023-01-22 ENCOUNTER — Encounter (HOSPITAL_COMMUNITY)
Admission: RE | Admit: 2023-01-22 | Discharge: 2023-01-22 | Disposition: A | Discharge status: Home / Self Care | Payer: Medicare Other | Source: Ambulatory Visit | Attending: Internal Medicine | Admitting: Internal Medicine

## 2023-01-22 ENCOUNTER — Encounter (HOSPITAL_COMMUNITY): Payer: Self-pay

## 2023-01-22 VITALS — Wt 129.6 lb

## 2023-01-22 DIAGNOSIS — J410 Simple chronic bronchitis: Secondary | ICD-10-CM | POA: Diagnosis not present

## 2023-01-22 NOTE — Progress Notes (Signed)
Daily Session Note  Patient Details  Name: Shannon Bowman MRN: [id number] Date of Birth: 19-Jun-1932 Referring Provider:   April Manson Pulmonary Rehab Walk Test from 11/16/2022 in [facility], Vascular, & [place]  Referring Provider Custovic       Encounter Date: 01/22/2023  Check In:  Session Check In - 01/22/23 1529       Check-In   Supervising physician immediately available to respond to emergencies [facility] MD immediately available    Physician(s) Sharolyn Douglas    Location [facility]    Staff Present Janine Ores, RN, Quentin Ore, MS, ACSM-CEP, Exercise Physiologist;Carlette Wilber Oliphant, RN, BSN    Virtual Visit No    Medication changes reported     No    Fall or balance concerns reported    No    Tobacco Cessation No Change    Warm-up and Cool-down Performed as group-led instruction    Resistance Training Performed Yes    VAD Patient? No    PAD/SET Patient? No      Pain Assessment   Currently in Pain? No/denies    Pain Score 0-No pain    Multiple Pain Sites No             Capillary Blood Glucose: No results found for this or any previous visit (from the past 24 hour(s)).   Exercise Prescription Changes - 01/22/23 1500       Response to Exercise   Blood Pressure (Admit) 136/68    Blood Pressure (Exercise) 148/78    Blood Pressure (Exit) 121/76    Heart Rate (Admit) 88 bpm    Heart Rate (Exercise) 108 bpm    Heart Rate (Exit) 98 bpm    Oxygen Saturation (Admit) 98 %    Oxygen Saturation (Exercise) 99 %    Oxygen Saturation (Exit) 98 %    Rating of Perceived Exertion (Exercise) 13    Perceived Dyspnea (Exercise) 2    Duration Continue with 30 min of aerobic exercise without signs/symptoms of physical distress.    Intensity THRR unchanged      Progression   Progression Continue to progress workloads to maintain intensity without signs/symptoms of physical distress.      Resistance Training   Training  Prescription Yes    Weight Red bands    Reps 10-15    Time 10 Minutes      NuStep   Level 5    SPM 70    Minutes 15    METs 2.9      Recumbant Elliptical   Level 5    RPM 70    Minutes 15    METs 5.4             Social History   Tobacco Use  Smoking Status Former   Packs/day: 0.10   Years: 3.00   Total pack years: 0.30   Types: Cigarettes   Quit date: 12/11/1963   Years since quitting: 59.1  Smokeless Tobacco Never    Goals Met:  Independence with exercise equipment Improved SOB with ADL's Exercise tolerated well No report of concerns or symptoms today Strength training completed today  Goals Unmet:  Not Applicable  Comments: Service time is from 1315 to 1452    Dr. Rodman Pickle is Medical Director for Pulmonary Rehab at [facility].

## 2023-01-23 NOTE — Progress Notes (Signed)
Pulmonary Individual Treatment Plan  Patient Details  Name: Shannon Bowman MRN: [id number] Date of Birth: 1932/10/24 Referring Provider:   April Manson Pulmonary Rehab Walk Test from 11/16/2022 in [facility] [place]  Referring Provider Custovic       Initial Encounter Date:  Flowsheet Row Pulmonary Rehab Walk Test from 11/16/2022 in [facility] [place]  Date 11/16/22       Visit Diagnosis: Simple chronic bronchitis ([place])  Patient's Home Medications on Admission:   Current Outpatient Medications:    acetaminophen (TYLENOL) 325 MG tablet, Take 650 mg by mouth every 6 (six) hours as needed for mild pain., Disp: , Rfl:    amLODipine (NORVASC) 5 MG tablet, TAKE 1 TABLET BY MOUTH AT BEDTIME, Disp: 90 tablet, Rfl: 1   ascorbic acid (VITAMIN C) 500 MG tablet, Take 500 mg by mouth daily., Disp: , Rfl:    aspirin EC 81 MG tablet, Take 81 mg by mouth daily. Swallow whole., Disp: , Rfl:    calcium carbonate (OS-CAL) 600 MG TABS, Take 600 mg by mouth 2 (two) times daily with a meal., Disp: , Rfl:    cetirizine (ZYRTEC) 10 MG tablet, Take 10 mg by mouth daily as needed for allergies. , Disp: , Rfl:    Cholecalciferol (VITAMIN D3) 25 MCG (1000 UT) CAPS, Take by mouth., Disp: , Rfl:    estradiol (ESTRACE) 0.1 MG/GM vaginal cream, Place 1 Applicatorful vaginally once a week., Disp: , Rfl:    ezetimibe (ZETIA) 10 MG tablet, Take 1 tablet (10 mg total) by mouth daily., Disp: , Rfl:    omeprazole (PRILOSEC) 40 MG capsule, Take 40 mg by mouth as needed., Disp: , Rfl:    Probiotic CAPS, Take 2 capsules by mouth daily., Disp: , Rfl:    Propylene Glycol (SYSTANE BALANCE OP), Apply 1 drop to eye at bedtime., Disp: , Rfl:    rosuvastatin (CRESTOR) 20 MG tablet, Take 20 mg by mouth daily., Disp: , Rfl:   Past Medical History: Past Medical History:  Diagnosis Date   Arthritis    Cataract    right eye    Cataract, nuclear sclerotic, left eye 06/02/2020   The nature of cataract was discussed with the patient as well as the elective nature of surgery. The patient was reassured that surgery at a later date does not put the patient at risk for a worse outcome. It was emphasized that the need for surgery is dictated by the patient's quality of life as influenced by the cataract. Patient was instructed to maintain close follow up with their general eye    Chronic kidney disease    states Dr. Delfina Redwood told her CKD stage 3   GERD (gastroesophageal reflux disease)    History of hiatal hernia    Spinal stenosis    TIA (transient ischemic attack) 2023    Tobacco Use: Social History   Tobacco Use  Smoking Status Former   Packs/day: 0.10   Years: 3.00   Total pack years: 0.30   Types: Cigarettes   Quit date: 12/11/1963   Years since quitting: 59.1  Smokeless Tobacco Never    Labs: Review Flowsheet        No data to display          Capillary Blood Glucose: No results found for: "GLUCAP"   Pulmonary Assessment Scores:  Pulmonary Assessment Scores     Row Name 11/16/22 1112  01/18/23 1423       ADL UCSD   ADL Phase -- Exit    SOB Score total 56 18      CAT Score   CAT Score 18 6      mMRC Score   mMRC Score 4 --            UCSD: Self-administered rating of dyspnea associated with activities of daily living (ADLs) 6-point scale (0 = "not at all" to 5 = "maximal or unable to do because of breathlessness")  Scoring Scores range from 0 to 120.  Minimally important difference is 5 units  CAT: CAT can identify the health impairment of COPD patients and is better correlated with disease progression.  CAT has a scoring range of zero to 40. The CAT score is classified into four groups of low (less than 10), medium (10 - 20), high (21-30) and very high (31-40) based on the impact level of disease on health status. A CAT score over 10 suggests significant symptoms.  A worsening  CAT score could be explained by an exacerbation, poor medication adherence, poor inhaler technique, or progression of COPD or comorbid conditions.  CAT MCID is 2 points  mMRC: mMRC (Modified Medical Research Council) Dyspnea Scale is used to assess the degree of baseline functional disability in patients of respiratory disease due to dyspnea. No minimal important difference is established. A decrease in score of 1 point or greater is considered a positive change.   Pulmonary Function Assessment:  Pulmonary Function Assessment - 11/16/22 1115       Breath   Shortness of Breath Fear of Shortness of Breath;Limiting activity             Exercise Target Goals: Exercise Program Goal: Individual exercise prescription set using results from initial 6 min walk test and THRR while considering  patient's activity barriers and safety.   Exercise Prescription Goal: Initial exercise prescription builds to 30-45 minutes a day of aerobic activity, 2-3 days per week.  Home exercise guidelines will be given to patient during program as part of exercise prescription that the participant will acknowledge.  Activity Barriers & Risk Stratification:  Activity Barriers & Cardiac Risk Stratification - 11/16/22 1114       Activity Barriers & Cardiac Risk Stratification   Activity Barriers Deconditioning;Muscular Weakness;Shortness of Breath;Arthritis;Joint Problems;Neck/Spine Problems             6 Minute Walk:  6 Minute Walk     Row Name 11/16/22 1206         6 Minute Walk   Phase Initial     Distance 1190 feet     Walk Time 6 minutes     # of Rest Breaks 0     MPH 2.25     METS 1.96     RPE 13     Perceived Dyspnea  3     VO2 Peak 6.86     Symptoms Yes (comment)     Comments Pt c/o lightheadedness and shaky feeling 4 1/2 min in to walk test. Declined rest. Eventually resolved after 5 min rest. Sts she has had this feeling before when she gets SOB/exerted. (note:used forehead probe  for SpO2)     Resting HR 85 bpm     Resting BP 146/64     Resting Oxygen Saturation  100 %     Exercise Oxygen Saturation  during 6 min walk 100 %     Max Ex. HR 101 bpm  Max Ex. BP 150/60     2 Minute Post BP 138/60       Interval HR   1 Minute HR 89     2 Minute HR 97     3 Minute HR 94     4 Minute HR 94     5 Minute HR 99     6 Minute HR 101     2 Minute Post HR 84     Interval Heart Rate? Yes       Interval Oxygen   Interval Oxygen? Yes     Baseline Oxygen Saturation % 100 %     1 Minute Oxygen Saturation % 100 %     1 Minute Liters of Oxygen 0 L     2 Minute Oxygen Saturation % 99 %     2 Minute Liters of Oxygen 0 L     3 Minute Oxygen Saturation % 100 %     3 Minute Liters of Oxygen 0 L     4 Minute Oxygen Saturation % 100 %     4 Minute Liters of Oxygen 0 L     5 Minute Oxygen Saturation % 100 %     5 Minute Liters of Oxygen 0 L     6 Minute Oxygen Saturation % 100 %     6 Minute Liters of Oxygen 0 L     2 Minute Post Oxygen Saturation % 100 %     2 Minute Post Liters of Oxygen 0 L              Oxygen Initial Assessment:  Oxygen Initial Assessment - 11/16/22 1115       Home Oxygen   Home Oxygen Device None    Sleep Oxygen Prescription None    Home Exercise Oxygen Prescription None    Home Resting Oxygen Prescription None      Initial 6 min Walk   Oxygen Used None      Program Oxygen Prescription   Program Oxygen Prescription None      Intervention   Short Term Goals To learn and understand importance of maintaining oxygen saturations>88%;To learn and understand importance of monitoring SPO2 with pulse oximeter and demonstrate accurate use of the pulse oximeter.;To learn and demonstrate proper pursed lip breathing techniques or other breathing techniques.     Long  Term Goals Maintenance of O2 saturations>88%;Verbalizes importance of monitoring SPO2 with pulse oximeter and return demonstration;Exhibits proper breathing techniques, such as pursed  lip breathing or other method taught during program session             Oxygen Re-Evaluation:  Oxygen Re-Evaluation     Row Name 11/26/22 1449 12/25/22 0947 01/14/23 1223         Program Oxygen Prescription   Program Oxygen Prescription None None None       Home Oxygen   Home Oxygen Device None None None     Sleep Oxygen Prescription None None None     Home Exercise Oxygen Prescription None None None     Home Resting Oxygen Prescription None None None       Goals/Expected Outcomes   Short Term Goals To learn and understand importance of maintaining oxygen saturations>88%;To learn and understand importance of monitoring SPO2 with pulse oximeter and demonstrate accurate use of the pulse oximeter.;To learn and demonstrate proper pursed lip breathing techniques or other breathing techniques.  To learn and understand importance of maintaining oxygen saturations>88%;To learn and understand  importance of monitoring SPO2 with pulse oximeter and demonstrate accurate use of the pulse oximeter.;To learn and demonstrate proper pursed lip breathing techniques or other breathing techniques.  To learn and understand importance of maintaining oxygen saturations>88%;To learn and understand importance of monitoring SPO2 with pulse oximeter and demonstrate accurate use of the pulse oximeter.;To learn and demonstrate proper pursed lip breathing techniques or other breathing techniques.      Long  Term Goals Maintenance of O2 saturations>88%;Verbalizes importance of monitoring SPO2 with pulse oximeter and return demonstration;Exhibits proper breathing techniques, such as pursed lip breathing or other method taught during program session Maintenance of O2 saturations>88%;Verbalizes importance of monitoring SPO2 with pulse oximeter and return demonstration;Exhibits proper breathing techniques, such as pursed lip breathing or other method taught during program session Maintenance of O2 saturations>88%;Verbalizes  importance of monitoring SPO2 with pulse oximeter and return demonstration;Exhibits proper breathing techniques, such as pursed lip breathing or other method taught during program session     Comments -- no change no change     Goals/Expected Outcomes Compliance and understanding of oxygen saturation monitoring and breathing techniques to decrease shortness of breath. Compliance and understanding of oxygen saturation monitoring and breathing techniques to decrease shortness of breath. Compliance and understanding of oxygen saturation monitoring and breathing techniques to decrease shortness of breath. Compliance and understanding of oxygen saturation monitoring and breathing techniques to decrease shortness of breath.              Oxygen Discharge (Final Oxygen Re-Evaluation):  Oxygen Re-Evaluation - 01/14/23 1223       Program Oxygen Prescription   Program Oxygen Prescription None      Home Oxygen   Home Oxygen Device None    Sleep Oxygen Prescription None    Home Exercise Oxygen Prescription None    Home Resting Oxygen Prescription None      Goals/Expected Outcomes   Short Term Goals To learn and understand importance of maintaining oxygen saturations>88%;To learn and understand importance of monitoring SPO2 with pulse oximeter and demonstrate accurate use of the pulse oximeter.;To learn and demonstrate proper pursed lip breathing techniques or other breathing techniques.     Long  Term Goals Maintenance of O2 saturations>88%;Verbalizes importance of monitoring SPO2 with pulse oximeter and return demonstration;Exhibits proper breathing techniques, such as pursed lip breathing or other method taught during program session    Comments no change    Goals/Expected Outcomes Compliance and understanding of oxygen saturation monitoring and breathing techniques to decrease shortness of breath.             Initial Exercise Prescription:  Initial Exercise Prescription - 11/16/22 1200        Date of Initial Exercise RX and Referring Provider   Date 11/16/22    Referring Provider Custovic    Expected Discharge Date 11/16/22      Recumbant Bike   Level 1    RPM 70    Minutes 15      NuStep   Level 1    SPM 70    Minutes 15      Prescription Details   Frequency (times per week) 2    Duration Progress to 30 minutes of continuous aerobic without signs/symptoms of physical distress      Intensity   THRR 40-80% of Max Heartrate 53-106    Ratings of Perceived Exertion 11-13    Perceived Dyspnea 0-4      Progression   Progression Continue progressive overload as per policy without signs/symptoms or  physical distress.      Resistance Training   Training Prescription Yes    Weight red bands    Reps 10-15             Perform Capillary Blood Glucose checks as needed.  Exercise Prescription Changes:   Exercise Prescription Changes     Row Name 12/11/22 1500 12/25/22 1500 01/08/23 1500 01/22/23 1500       Response to Exercise   Blood Pressure (Admit) 104/62 144/60 142/76 136/68    Blood Pressure (Exercise) 140/68 158/70 146/70 148/78    Blood Pressure (Exit) 118/60 120/64 128/70 121/76    Heart Rate (Admit) 83 bpm 68 bpm 79 bpm 88 bpm    Heart Rate (Exercise) 97 bpm 101 bpm 105 bpm 108 bpm    Heart Rate (Exit) 80 bpm 80 bpm 86 bpm 98 bpm    Oxygen Saturation (Admit) 96 % 99 % 96 % 98 %    Oxygen Saturation (Exercise) 97 % 98 % 95 % 99 %    Oxygen Saturation (Exit) 97 % 98 % 96 % 98 %    Rating of Perceived Exertion (Exercise) 13 13 13 13    $ Perceived Dyspnea (Exercise) 1.5 1 1 2    $ Duration Continue with 30 min of aerobic exercise without signs/symptoms of physical distress. Continue with 30 min of aerobic exercise without signs/symptoms of physical distress. Continue with 30 min of aerobic exercise without signs/symptoms of physical distress. Continue with 30 min of aerobic exercise without signs/symptoms of physical distress.    Intensity THRR  unchanged THRR unchanged THRR unchanged THRR unchanged      Progression   Progression Continue to progress workloads to maintain intensity without signs/symptoms of physical distress. Continue to progress workloads to maintain intensity without signs/symptoms of physical distress. Continue to progress workloads to maintain intensity without signs/symptoms of physical distress. Continue to progress workloads to maintain intensity without signs/symptoms of physical distress.      Resistance Training   Training Prescription Yes Yes Yes Yes    Weight Red Bands Red Bands Red bands Red bands    Reps 10-15 10-15 10-15 10-15    Time 10 Minutes 10 Minutes 10 Minutes 10 Minutes      Recumbant Bike   Level 1 1 -- --    RPM 70 70 -- --    Minutes 15 15 -- --    METs 2 2.7 -- --      NuStep   Level 4 4 5 5    $ SPM 70 70 70 70    Minutes 15 15 15 15    $ METs 2 2.5 3.6 2.9      Recumbant Elliptical   Level -- -- 3 5    RPM -- -- 70 70    Minutes -- -- 15 15    METs -- -- 4.7 5.4             Exercise Comments:   Exercise Comments     Row Name 11/22/22 1532 12/18/22 1446         Exercise Comments Pt completed first day of exercise. Shannon Bowman exercised for 15 min on the recumbent bike and Nustep. She averaged 2.0 METs at level 1 on the recumbent bike and 2.0 METs at level 2 on the Nustep. She performed the warmup and cooldown standing without limitations. Discussed METs and how to increase METs Discussed with pt home exercise plan. She is currently not exercising, just active. Discussed adding one  nonrehab day a week. She is interested in returning to the [facility] and doing exercise like she does in [place]. She could also walk on her street, building up to 30 min. Pt agreeable.               Exercise Goals and Review:   Exercise Goals     Row Name 11/16/22 1216 11/20/22 1558 12/25/22 0942 01/14/23 1217       Exercise Goals   Increase Physical Activity Yes Yes Yes Yes    Intervention  Provide advice, education, support and counseling about physical activity/exercise needs.;Develop an individualized exercise prescription for aerobic and resistive training based on initial evaluation findings, risk stratification, comorbidities and participant's personal goals. Provide advice, education, support and counseling about physical activity/exercise needs.;Develop an individualized exercise prescription for aerobic and resistive training based on initial evaluation findings, risk stratification, comorbidities and participant's personal goals. Provide advice, education, support and counseling about physical activity/exercise needs.;Develop an individualized exercise prescription for aerobic and resistive training based on initial evaluation findings, risk stratification, comorbidities and participant's personal goals. Provide advice, education, support and counseling about physical activity/exercise needs.;Develop an individualized exercise prescription for aerobic and resistive training based on initial evaluation findings, risk stratification, comorbidities and participant's personal goals.    Expected Outcomes Short Term: Attend rehab on a regular basis to increase amount of physical activity.;Long Term: Exercising regularly at least 3-5 days a week.;Long Term: Add in home exercise to make exercise part of routine and to increase amount of physical activity. Short Term: Attend rehab on a regular basis to increase amount of physical activity.;Long Term: Exercising regularly at least 3-5 days a week.;Long Term: Add in home exercise to make exercise part of routine and to increase amount of physical activity. Short Term: Attend rehab on a regular basis to increase amount of physical activity.;Long Term: Exercising regularly at least 3-5 days a week.;Long Term: Add in home exercise to make exercise part of routine and to increase amount of physical activity. Short Term: Attend rehab on a regular basis to  increase amount of physical activity.;Long Term: Exercising regularly at least 3-5 days a week.;Long Term: Add in home exercise to make exercise part of routine and to increase amount of physical activity.    Increase Strength and Stamina Yes Yes Yes Yes    Intervention Provide advice, education, support and counseling about physical activity/exercise needs.;Develop an individualized exercise prescription for aerobic and resistive training based on initial evaluation findings, risk stratification, comorbidities and participant's personal goals. Provide advice, education, support and counseling about physical activity/exercise needs.;Develop an individualized exercise prescription for aerobic and resistive training based on initial evaluation findings, risk stratification, comorbidities and participant's personal goals. Provide advice, education, support and counseling about physical activity/exercise needs.;Develop an individualized exercise prescription for aerobic and resistive training based on initial evaluation findings, risk stratification, comorbidities and participant's personal goals. Provide advice, education, support and counseling about physical activity/exercise needs.;Develop an individualized exercise prescription for aerobic and resistive training based on initial evaluation findings, risk stratification, comorbidities and participant's personal goals.    Expected Outcomes Short Term: Increase workloads from initial exercise prescription for resistance, speed, and METs.;Short Term: Perform resistance training exercises routinely during rehab and add in resistance training at home;Long Term: Improve cardiorespiratory fitness, muscular endurance and strength as measured by increased METs and functional capacity (6MWT) Short Term: Increase workloads from initial exercise prescription for resistance, speed, and METs.;Short Term: Perform resistance training exercises routinely during rehab and add in  resistance training at home;Long Term: Improve cardiorespiratory fitness, muscular endurance and strength as measured by increased METs and functional capacity (6MWT) Short Term: Increase workloads from initial exercise prescription for resistance, speed, and METs.;Short Term: Perform resistance training exercises routinely during rehab and add in resistance training at home;Long Term: Improve cardiorespiratory fitness, muscular endurance and strength as measured by increased METs and functional capacity (6MWT) Short Term: Increase workloads from initial exercise prescription for resistance, speed, and METs.;Short Term: Perform resistance training exercises routinely during rehab and add in resistance training at home;Long Term: Improve cardiorespiratory fitness, muscular endurance and strength as measured by increased METs and functional capacity (6MWT)    Able to understand and use rate of perceived exertion (RPE) scale Yes Yes Yes Yes    Intervention Provide education and explanation on how to use RPE scale Provide education and explanation on how to use RPE scale Provide education and explanation on how to use RPE scale Provide education and explanation on how to use RPE scale    Expected Outcomes Short Term: Able to use RPE daily in rehab to express subjective intensity level;Long Term:  Able to use RPE to guide intensity level when exercising independently Short Term: Able to use RPE daily in rehab to express subjective intensity level;Long Term:  Able to use RPE to guide intensity level when exercising independently Short Term: Able to use RPE daily in rehab to express subjective intensity level;Long Term:  Able to use RPE to guide intensity level when exercising independently Short Term: Able to use RPE daily in rehab to express subjective intensity level;Long Term:  Able to use RPE to guide intensity level when exercising independently    Able to understand and use Dyspnea scale Yes Yes Yes Yes     Intervention Provide education and explanation on how to use Dyspnea scale Provide education and explanation on how to use Dyspnea scale Provide education and explanation on how to use Dyspnea scale Provide education and explanation on how to use Dyspnea scale    Expected Outcomes Short Term: Able to use Dyspnea scale daily in rehab to express subjective sense of shortness of breath during exertion;Long Term: Able to use Dyspnea scale to guide intensity level when exercising independently Short Term: Able to use Dyspnea scale daily in rehab to express subjective sense of shortness of breath during exertion;Long Term: Able to use Dyspnea scale to guide intensity level when exercising independently Short Term: Able to use Dyspnea scale daily in rehab to express subjective sense of shortness of breath during exertion;Long Term: Able to use Dyspnea scale to guide intensity level when exercising independently Short Term: Able to use Dyspnea scale daily in rehab to express subjective sense of shortness of breath during exertion;Long Term: Able to use Dyspnea scale to guide intensity level when exercising independently    Knowledge and understanding of Target Heart Rate Range (THRR) Yes Yes Yes Yes    Intervention Provide education and explanation of THRR including how the numbers were predicted and where they are located for reference Provide education and explanation of THRR including how the numbers were predicted and where they are located for reference Provide education and explanation of THRR including how the numbers were predicted and where they are located for reference Provide education and explanation of THRR including how the numbers were predicted and where they are located for reference    Expected Outcomes Short Term: Able to state/look up THRR;Long Term: Able to use THRR to govern intensity when exercising  independently;Short Term: Able to use daily as guideline for intensity in rehab Short Term: Able  to state/look up THRR;Long Term: Able to use THRR to govern intensity when exercising independently;Short Term: Able to use daily as guideline for intensity in rehab Short Term: Able to state/look up THRR;Long Term: Able to use THRR to govern intensity when exercising independently;Short Term: Able to use daily as guideline for intensity in rehab Short Term: Able to state/look up THRR;Long Term: Able to use THRR to govern intensity when exercising independently;Short Term: Able to use daily as guideline for intensity in rehab    Understanding of Exercise Prescription Yes Yes Yes Yes    Intervention Provide education, explanation, and written materials on patient's individual exercise prescription Provide education, explanation, and written materials on patient's individual exercise prescription Provide education, explanation, and written materials on patient's individual exercise prescription Provide education, explanation, and written materials on patient's individual exercise prescription    Expected Outcomes Short Term: Able to explain program exercise prescription;Long Term: Able to explain home exercise prescription to exercise independently Short Term: Able to explain program exercise prescription;Long Term: Able to explain home exercise prescription to exercise independently Short Term: Able to explain program exercise prescription;Long Term: Able to explain home exercise prescription to exercise independently Short Term: Able to explain program exercise prescription;Long Term: Able to explain home exercise prescription to exercise independently             Exercise Goals Re-Evaluation :  Exercise Goals Re-Evaluation     Row Name 11/26/22 1448 12/25/22 0943 01/14/23 1217         Exercise Goal Re-Evaluation   Exercise Goals Review Increase Physical Activity;Able to understand and use Dyspnea scale;Understanding of Exercise Prescription;Increase Strength and Stamina;Knowledge and  understanding of Target Heart Rate Range (THRR);Able to understand and use rate of perceived exertion (RPE) scale Increase Physical Activity;Able to understand and use Dyspnea scale;Understanding of Exercise Prescription;Increase Strength and Stamina;Knowledge and understanding of Target Heart Rate Range (THRR);Able to understand and use rate of perceived exertion (RPE) scale Increase Physical Activity;Able to understand and use Dyspnea scale;Understanding of Exercise Prescription;Increase Strength and Stamina;Knowledge and understanding of Target Heart Rate Range (THRR);Able to understand and use rate of perceived exertion (RPE) scale     Comments Pt has had one exercise session, which she tolerated well, averaging 2.0 METs on the recumbent bike and Nustep. It is too early to discern any progressions. Will continue to monitor. Pt has had 9 exercise sessions. She has not missed. She is exercising 15 min on the recumbent bike and Nustep. She is on level 2 on the recumbent bike, METs 2.6. She is then exercising on the nustep at level 4, METs 2.1. She is progressing well. Pt has had 15 exercise sessions with good attendance.  She is exercising 15 min on the recumbent stepper and recumbent elliptical. She is on level 5 on the recumbent stepper, METs 3.1. She is then exercising on the recumbent elliptical at level 5, METs 4.4. She has been very motivated and has progressed well.     Expected Outcomes Through exercise at rehab and home, the patient will decrease shortness of breath with daily activities and feel confident in carrying out an exercise regimen at home. Through exercise at rehab and home, the patient will decrease shortness of breath with daily activities and feel confident in carrying out an exercise regimen at home. Through exercise at rehab and home, the patient will decrease shortness of breath with daily activities and  feel confident in carrying out an exercise regimen at home.               Discharge Exercise Prescription (Final Exercise Prescription Changes):  Exercise Prescription Changes - 01/22/23 1500       Response to Exercise   Blood Pressure (Admit) 136/68    Blood Pressure (Exercise) 148/78    Blood Pressure (Exit) 121/76    Heart Rate (Admit) 88 bpm    Heart Rate (Exercise) 108 bpm    Heart Rate (Exit) 98 bpm    Oxygen Saturation (Admit) 98 %    Oxygen Saturation (Exercise) 99 %    Oxygen Saturation (Exit) 98 %    Rating of Perceived Exertion (Exercise) 13    Perceived Dyspnea (Exercise) 2    Duration Continue with 30 min of aerobic exercise without signs/symptoms of physical distress.    Intensity THRR unchanged      Progression   Progression Continue to progress workloads to maintain intensity without signs/symptoms of physical distress.      Resistance Training   Training Prescription Yes    Weight Red bands    Reps 10-15    Time 10 Minutes      NuStep   Level 5    SPM 70    Minutes 15    METs 2.9      Recumbant Elliptical   Level 5    RPM 70    Minutes 15    METs 5.4             Nutrition:  Target Goals: Understanding of nutrition guidelines, daily intake of sodium <1536m, cholesterol <2067m calories 30% from fat and 7% or less from saturated fats, daily to have 5 or more servings of fruits and vegetables.  Biometrics:  Pre Biometrics - 11/16/22 1217       Pre Biometrics   Grip Strength 18 kg              Nutrition Therapy Plan and Nutrition Goals:  Nutrition Therapy & Goals - 01/15/23 1540       Nutrition Therapy   Diet Heart Healthy diet    Drug/Food Interactions Statins/Certain Fruits      Personal Nutrition Goals   Nutrition Goal Patient to continue to use the plate method as a daily guide for meal planning to include lean protein/plant protein, fruits, vegetables whole grains, low fat dairy as part of well balanced diet.    Comments RaRecieives alone as her husband passed away 1.5 years ago. She reports  no nutrition concerns at this time. She reports eating a wide variety of foods and eating 2-3 meals daily. Her daughters are very supportive and provide many meals for her. She continues regular follow-up with cardiology and does weigh daily. Her losartan/hctz was stopped on 08/14/22 due to low sodium concerns; she continues amlodipine and mindfulness of sodium intake. She continues regular follow-up with pulmonary and PCP; she did recently re-start crestor.      Intervention Plan   Intervention Prescribe, educate and counsel regarding individualized specific dietary modifications aiming towards targeted core components such as weight, hypertension, lipid management, diabetes, heart failure and other comorbidities.;Nutrition handout(s) given to patient.    Expected Outcomes Short Term Goal: Understand basic principles of dietary content, such as calories, fat, sodium, cholesterol and nutrients.;Long Term Goal: Adherence to prescribed nutrition plan.             Nutrition Assessments:  Nutrition Assessments - 01/18/23 0825  Rate Your Plate Scores   Post Score 64            MEDIFICTS Score Key: ?70 Need to make dietary changes  40-70 Heart Healthy Diet ? 40 Therapeutic Level Cholesterol Diet  Flowsheet Row PULMONARY REHAB OTHER RESPIRATORY from [facility]17/20[facility] in [facility]  Picture Your Plate Total Score on Discharge 64      Picture Your Plate Scores: [id number] Unhealthy dietary pattern with much room for improvement. 41-50 Dietary pattern unlikely to meet recommendations for good health and room for improvement. 51-60 More healthful dietary pattern, with some room for improvement.  >60 Healthy dietary pattern, although there may be some specific behaviors that could be improved.    Nutrition Goals Re-Evaluation:  Nutrition Goals Re-Evaluation     Shannon Bowman Name 11/22/22 1549 12/20/22 1402 01/15/23 1540         Goals    Current Weight 130 lb 15.3 oz (59.4 kg) 127 lb 3.3 oz (57.7 kg) 128 lb 4.9 oz (58.2 kg)     Comment Lipids WNL, LDL 118 no new labs at this time; Lipids WNL, LDL 118 --     Expected Outcome Shannon Bowman lives alone as her husband passed away 1.5 years ago. She reports no nutrition concerns at this time. She reports eating a wide variety of foods, eating 2-3 meals daily, and no changes to appetite. Her daughters are very supportive and provide many meals for her. She continues regular follow-up with cardiology and does weigh daily. Her losartan/hctz was stopped on 08/14/22 due to low sodium concerns. Patient will benefit from continued attendance to pulmonary rehab. Goals in action. Shannon Bowman lives alone as her husband passed away 1.5 years ago. She reports no nutrition concerns at this time. She reports eating a wide variety of foods and eating 2-3 meals daily. She is down 3.5# since starting with our program.  Her daughters are very supportive and provide many meals for her. She continues regular follow-up with cardiology and does weigh daily. Her losartan/hctz was stopped on 08/14/22 due to low sodium concerns; she continues amlodipine and mindfulness of sodium intake. She continues regular follow-up with pulmonary and PCP. Shannon Bowman lives alone as her husband passed away 1.5 years ago. She reports no nutrition concerns at this time. She reports eating a wide variety of foods and eating 2-3 meals daily. Her daughters are very supportive and provide many meals for her. She continues regular follow-up with cardiology and does weigh daily. Her losartan/hctz was stopped on 08/14/22 due to low sodium concerns; she continues amlodipine and mindfulness of sodium intake. She continues regular follow-up with pulmonary and PCP; she did recently re-start crestor. She will benefit from continued participation in pulmonary rehab for nutrition and exercise support.              Nutrition Goals Discharge (Final Nutrition Goals  Re-Evaluation):  Nutrition Goals Re-Evaluation - 01/15/23 1540       Goals   Current Weight 128 lb 4.9 oz (58.2 kg)    Expected Outcome Shannon Bowman lives alone as her husband passed away 1.5 years ago. She reports no nutrition concerns at this time. She reports eating a wide variety of foods and eating 2-3 meals daily. Her daughters are very supportive and provide many meals for her. She continues regular follow-up with cardiology and does weigh daily. Her losartan/hctz was stopped on 08/14/22 due to low sodium concerns; she continues amlodipine and mindfulness of sodium intake. She  continues regular follow-up with pulmonary and PCP; she did recently re-start crestor. She will benefit from continued participation in pulmonary rehab for nutrition and exercise support.             Psychosocial: Target Goals: Acknowledge presence or absence of significant depression and/or stress, maximize coping skills, provide positive support system. Participant is able to verbalize types and ability to use techniques and skills needed for reducing stress and depression.  Initial Review & Psychosocial Screening:  Initial Psych Review & Screening - 11/16/22 1108       Initial Review   Current issues with None Identified      Family Dynamics   Good Support System? Yes    Comments Pt states she and dgt go to lunch and shopping almost everyday      Barriers   Psychosocial barriers to participate in program There are no identifiable barriers or psychosocial needs.      Screening Interventions   Interventions Encouraged to exercise    Expected Outcomes Short Term goal: Utilizing psychosocial counselor, staff and physician to assist with identification of specific Stressors or current issues interfering with healing process. Setting desired goal for each stressor or current issue identified.;Long Term Goal: Stressors or current issues are controlled or eliminated.;Short Term goal: Identification and review with  participant of any Quality of Life or Depression concerns found by scoring the questionnaire.;Long Term goal: The participant improves quality of Life and PHQ9 Scores as seen by post scores and/or verbalization of changes             Quality of Life Scores:  Scores of 19 and below usually indicate a poorer quality of life in these areas.  A difference of  2-3 points is a clinically meaningful difference.  A difference of 2-3 points in the total score of the Quality of Life Index has been associated with significant improvement in overall quality of life, self-image, physical symptoms, and general health in studies assessing change in quality of life.  PHQ-9: Review Flowsheet       01/18/2023 11/16/2022  Depression screen PHQ 2/9  Decreased Interest 0 0  Down, Depressed, Hopeless 0 0  PHQ - 2 Score 0 0  Altered sleeping 0 0  Tired, decreased energy 0 0  Change in appetite 0 0  Feeling bad or failure about yourself  0 0  Trouble concentrating 0 0  Moving slowly or fidgety/restless 0 0  Suicidal thoughts 0 0  PHQ-9 Score 0 0   Interpretation of Total Score  Total Score Depression Severity:  1-4 = Minimal depression, 5-9 = Mild depression, 10-14 = Moderate depression, 15-19 = Moderately severe depression, 20-27 = Severe depression   Psychosocial Evaluation and Intervention:  Psychosocial Evaluation - 11/16/22 1110       Psychosocial Evaluation & Interventions   Interventions Encouraged to exercise with the program and follow exercise prescription    Comments Pt denies any psychosocial barriers to the PR program    Expected Outcomes Pt will exercise and benefit from [place]  No Follow up required             Psychosocial Re-Evaluation:  Psychosocial Re-Evaluation     Shannon Bowman Name 11/22/22 1631 12/25/22 0822 01/15/23 0830         Psychosocial Re-Evaluation   Current issues with None Identified None Identified None Identified     Comments  Shannon Bowman started in the PR program today. we will continue to moniotr  for any psychosocial barriers. Shannon Bowman has attended 9 PR classes so far. She denies any psychosocial barriers or concerns. Shannon Bowman states that she has good support from her daughter. We will continue to monitor for any psychosocial barriers or concerns. Shannon Bowman has attended 15 PR classes so far. She states she has a little bit of stress because she needs to buy a new car. Shannon Bowman states that she has good support from her daughter. We will continue to monitor for any psychosocial barriers or concerns.     Expected Outcomes For her to participate in the program without any psychosocial issues or concerns. For Shannon Bowman to participate in the program without any psychosocial issues or concerns. For Shannon Bowman to participate in the program without any psychosocial issues or concerns.     Interventions -- Encouraged to attend Pulmonary Rehabilitation for the exercise Encouraged to attend Pulmonary Rehabilitation for the exercise     Continue Psychosocial Services  No Follow up required No Follow up required No Follow up required              Psychosocial Discharge (Final Psychosocial Re-Evaluation):  Psychosocial Re-Evaluation - 01/15/23 0830       Psychosocial Re-Evaluation   Current issues with None Identified    Comments Shannon Bowman has attended 15 PR classes so far. She states she has a little bit of stress because she needs to buy a new car. Shannon Bowman states that she has good support from her daughter. We will continue to monitor for any psychosocial barriers or concerns.    Expected Outcomes For Albertha to participate in the program without any psychosocial issues or concerns.    Interventions Encouraged to attend Pulmonary Rehabilitation for the exercise    Continue Psychosocial Services  No Follow up required             Education: Education Goals: Education classes will be provided on a weekly basis, covering required topics. Participant  will state understanding/return demonstration of topics presented.  Learning Barriers/Preferences:  Learning Barriers/Preferences - 11/16/22 1111       Learning Barriers/Preferences   Learning Barriers Hearing    Learning Preferences Verbal Instruction;Group Instruction;Individual Instruction             Education Topics: Introduction to Pulmonary Rehab Group instruction provided by PowerPoint, verbal discussion, and written material to support subject matter. Instructor reviews what Pulmonary Rehab is, the purpose of the program, and how patients are referred.     Know Your Numbers Group instruction that is supported by a PowerPoint presentation. Instructor discusses importance of knowing and understanding resting, exercise, and post-exercise oxygen saturation, heart rate, and blood pressure. Oxygen saturation, heart rate, blood pressure, rating of perceived exertion, and dyspnea are reviewed along with a normal range for these values.  Flowsheet Row PULMONARY REHAB OTHER RESPIRATORY from 12/27/2022 in [facility] for Heart, Vascular, & [place]  Date 12/27/22  Educator Kelle Darting, [place]  Instruction Review Code 1- Verbalizes Understanding       Exercise for the Pulmonary Patient Group instruction that is supported by a PowerPoint presentation. Instructor discusses benefits of exercise, core components of exercise, frequency, duration, and intensity of an exercise routine, importance of utilizing pulse oximetry during exercise, safety while exercising, and options of places to exercise outside of rehab.  Flowsheet Row PULMONARY REHAB OTHER RESPIRATORY from 12/13/2022 in [facility] for Heart, Vascular, & [place]  Date 12/06/22  Educator EP  Instruction Review Code 1- Verbalizes Understanding  [  took handout]       MET Level  Group instruction provided by PowerPoint, verbal discussion, and written material to support subject  matter. Instructor reviews what METs are and how to increase METs.    Pulmonary Medications Verbally interactive group education provided by instructor with focus on inhaled medications and proper administration. Flowsheet Row PULMONARY REHAB OTHER RESPIRATORY from 12/20/2022 in [facility] for Heart, Vascular, & [place]  Date 12/20/22  Educator Ep  Instruction Review Code 1- Verbalizes Understanding       Anatomy and Physiology of the Respiratory System Group instruction provided by PowerPoint, verbal discussion, and written material to support subject matter. Instructor reviews respiratory cycle and anatomical components of the respiratory system and their functions. Instructor also reviews differences in obstructive and restrictive respiratory diseases with examples of each.  Flowsheet Row PULMONARY REHAB OTHER RESPIRATORY from 12/13/2022 in [facility] for Heart, Vascular, & [place]  Date 12/13/22  Educator Kelle Darting R.  Instruction Review Code 1- Verbalizes Understanding       Oxygen Safety Group instruction provided by PowerPoint, verbal discussion, and written material to support subject matter. There is an overview of "What is Oxygen" and "Why do we need it".  Instructor also reviews how to create a safe environment for oxygen use, the importance of using oxygen as prescribed, and the risks of noncompliance. There is a brief discussion on traveling with oxygen and resources the patient may utilize. Flowsheet Row PULMONARY REHAB OTHER RESPIRATORY from 01/03/2023 in [facility] for Heart, Vascular, & Lung Health  Date 01/03/23  Educator RN  Instruction Review Code 1- Verbalizes Understanding       Oxygen Use Group instruction provided by PowerPoint, verbal discussion, and written material to discuss how supplemental oxygen is prescribed and different types of oxygen supply systems. Resources for more  information are provided.  Flowsheet Row PULMONARY REHAB OTHER RESPIRATORY from 01/10/2023 in [facility] for Heart, Vascular, & [place]  Date 01/10/23  Educator RT  Instruction Review Code 1- Verbalizes Understanding  [handouts provided]       Breathing Techniques Group instruction that is supported by demonstration and informational handouts. Instructor discusses the benefits of pursed lip and diaphragmatic breathing and detailed demonstration on how to perform both.  Flowsheet Row PULMONARY REHAB OTHER RESPIRATORY from 01/17/2023 in [facility] for Heart, Vascular, & [place]  Date 01/17/23  Educator EP  Instruction Review Code 1- Verbalizes Understanding  [Handout also provided]        Risk Factor Reduction Group instruction that is supported by a PowerPoint presentation. Instructor discusses the definition of a risk factor, different risk factors for pulmonary disease, and how the heart and lungs work together. Flowsheet Row PULMONARY REHAB OTHER RESPIRATORY from 12/13/2022 in [facility] for Heart, Vascular, & [place]  Date 11/22/22  Educator EP  Instruction Review Code 1- Verbalizes Understanding       MD Day A group question and answer session with a medical doctor that allows participants to ask questions that relate to their pulmonary disease state.   Nutrition for the Pulmonary Patient Group instruction provided by PowerPoint slides, verbal discussion, and written materials to support subject matter. The instructor gives an explanation and review of healthy diet recommendations, which includes a discussion on weight management, recommendations for fruit and vegetable consumption, as well as protein, fluid, caffeine, fiber, sodium, sugar, and alcohol.  Tips for eating when patients are short of breath are discussed.    Other Education Group or individual verbal, written, or video instructions  that support the educational goals of the pulmonary rehab program.    Knowledge Questionnaire Score:  Knowledge Questionnaire Score - 01/18/23 1421       Knowledge Questionnaire Score   Post Score 13/18             Core Components/Risk Factors/Patient Goals at Admission:  Personal Goals and Risk Factors at Admission - 11/16/22 1111       Core Components/Risk Factors/Patient Goals on Admission    Weight Management Weight Maintenance    Improve shortness of breath with ADL's Yes    Intervention Provide education, individualized exercise plan and daily activity instruction to help decrease symptoms of SOB with activities of daily living.    Expected Outcomes Short Term: Improve cardiorespiratory fitness to achieve a reduction of symptoms when performing ADLs             Core Components/Risk Factors/Patient Goals Review:   Goals and Risk Factor Review     Row Name 11/27/22 0858 12/25/22 0835 01/15/23 0833         Core Components/Risk Factors/Patient Goals Review   Personal Goals Review Weight Management/Obesity;Develop more efficient breathing techniques such as purse lipped breathing and diaphragmatic breathing and practicing self-pacing with activity.;Improve shortness of breath with ADL's;Increase knowledge of respiratory medications and ability to use respiratory devices properly. Weight Management/Obesity;Develop more efficient breathing techniques such as purse lipped breathing and diaphragmatic breathing and practicing self-pacing with activity.;Improve shortness of breath with ADL's;Increase knowledge of respiratory medications and ability to use respiratory devices properly. Weight Management/Obesity;Develop more efficient breathing techniques such as purse lipped breathing and diaphragmatic breathing and practicing self-pacing with activity.;Improve shortness of breath with ADL's;Increase knowledge of respiratory medications and ability to use respiratory devices  properly.     Review Danelly has completed 1 exercise session. It is too soon to note any progresssions. Will continue to monitor. Leyda has completed 9 exercise session. She exercises on the recumbent bike and the NuStep. She is making progress in improving her intensity and METs. Her METs has gone from 2 to 2.6 on the recumbent bike and 2 to 2.9 on the NuStep. Iniki can verbalize her dyspnea scale as well as her rate of perceived exertion. She can demonstrate pursed lip breathing when she is short of breath. Tashe has also attended education classes on anatomy and physiology and exercise for the pulmonary patient. Decie enjoys coming to class and socializing with her peers. Katelan has completed 9 exercise session. She exercises on the recumbent bike and the NuStep. She is making progress in improving her intensity and METs. Her METs has gone from 2 to 2.6 on the recumbent bike and 2 to 2.9 on the NuStep. Darah can verbalize her dyspnea scale as well as her rate of perceived exertion. She can demonstrate pursed lip breathing when she is short of breath. Dorianne has also attended education classes on anatomy and physiology and exercise for the pulmonary patient. Romaine enjoys coming to class and socializing with her peers.     Expected Outcomes See admission goals. See admission goals See admission goals              Core Components/Risk Factors/Patient Goals at Discharge (Final Review):   Goals and Risk Factor Review - 01/15/23 0833       Core Components/Risk Factors/Patient Goals Review   Personal Goals Review  Weight Management/Obesity;Develop more efficient breathing techniques such as purse lipped breathing and diaphragmatic breathing and practicing self-pacing with activity.;Improve shortness of breath with ADL's;Increase knowledge of respiratory medications and ability to use respiratory devices properly.    Review Beneva has completed 9 exercise session. She exercises on the recumbent bike  and the NuStep. She is making progress in improving her intensity and METs. Her METs has gone from 2 to 2.6 on the recumbent bike and 2 to 2.9 on the NuStep. Jacolyn can verbalize her dyspnea scale as well as her rate of perceived exertion. She can demonstrate pursed lip breathing when she is short of breath. Lilyanne has also attended education classes on anatomy and physiology and exercise for the pulmonary patient. Anjoli enjoys coming to class and socializing with her peers.    Expected Outcomes See admission goals             ITP Comments:   Comments: Dr. Rodman Pickle is Medical Director for Pulmonary Rehab at [facility].

## 2023-01-24 ENCOUNTER — Encounter (HOSPITAL_COMMUNITY)
Admission: RE | Admit: 2023-01-24 | Discharge: 2023-01-24 | Disposition: A | Discharge status: Home / Self Care | Payer: Medicare Other | Source: Ambulatory Visit | Attending: Internal Medicine | Admitting: Internal Medicine

## 2023-01-24 DIAGNOSIS — J410 Simple chronic bronchitis: Secondary | ICD-10-CM | POA: Diagnosis not present

## 2023-01-24 DIAGNOSIS — J40 Bronchitis, not specified as acute or chronic: Secondary | ICD-10-CM

## 2023-01-24 NOTE — Progress Notes (Signed)
Daily Session Note  Patient Details  Name: Shannon Bowman MRN: [id number] Date of Birth: [birth year] Referring Provider:   April Manson Pulmonary Rehab Walk Test from 11/16/2022 in [facility] [place]  Referring Provider Custovic       Encounter Date: 01/24/2023  Check In:  Session Check In - 01/24/23 1415       Check-In   Supervising physician immediately available to respond to emergencies [facility] MD immediately available    Physician(s) Erin Hearing, NP    Location [facility]    Staff Present Janine Ores, RN, Quentin Ore, MS, ACSM-CEP, Exercise Physiologist;Shanitra Phillippi Yevonne Pax, ACSM-CEP, Exercise Physiologist;Samantha [place], RD, LDN;Other    Virtual Visit No    Medication changes reported     No    Fall or balance concerns reported    No    Tobacco Cessation No Change    Warm-up and Cool-down Performed as group-led instruction    Resistance Training Performed No    VAD Patient? No    PAD/SET Patient? No      Pain Assessment   Currently in Pain? No/denies    Multiple Pain Sites No             Capillary Blood Glucose: No results found for this or any previous visit (from the past 24 hour(s)).    Social History   Tobacco Use  Smoking Status Former   Packs/day: 0.10   Years: 3.00   Total pack years: 0.30   Types: Cigarettes   Quit date: 12/11/1963   Years since quitting: 59.1  Smokeless Tobacco Never    Goals Met:  Improved SOB with ADL's Exercise tolerated well Personal goals reviewed No report of concerns or symptoms today  Goals Unmet:  Not Applicable  Comments: Pt completed 6 min walk test and graduated. Has succeeded in program. Service time is from 1255 to 1320.    Dr. Rodman Pickle is Medical Director for Pulmonary Rehab at [facility].

## 2023-01-25 ENCOUNTER — Other Ambulatory Visit: Payer: Self-pay | Admitting: Cardiology

## 2023-01-28 NOTE — Progress Notes (Signed)
Discharge Progress Report  Patient Details  Name: Shannon Bowman MRN: [id number] Date of Birth: Jul 16, 1932 Referring Provider:   April Manson Pulmonary Rehab Walk Test from 11/16/2022 in [facility]  Referring Provider Custovic        Number of Visits: 67  Reason for Discharge:  Patient reached a stable level of exercise. Patient independent in their exercise. Patient has met program and personal goals.  Smoking History:  Social History   Tobacco Use  Smoking Status Former   Packs/day: 0.10   Years: 3.00   Total pack years: 0.30   Types: Cigarettes   Quit date: 12/11/1963   Years since quitting: 59.1  Smokeless Tobacco Never    Diagnosis:  Simple chronic bronchitis ([place])  Bronchitis  ADL [facility]:  Pulmonary Assessment Scores     Row Name 11/16/22 1112 01/18/23 1423 01/24/23 1626     ADL [facility]   ADL Phase -- Exit --   SOB Score total 56 18 --     CAT Score   CAT Score 18 6 --     mMRC Score   mMRC Score 4 -- 2            Initial Exercise Prescription:  Initial Exercise Prescription - 11/16/22 1200       Date of Initial Exercise RX and Referring Provider   Date 11/16/22    Referring Provider Custovic    Expected Discharge Date 11/16/22      Recumbant Bike   Level 1    RPM 70    Minutes 15      NuStep   Level 1    SPM 70    Minutes 15      Prescription Details   Frequency (times per week) 2    Duration Progress to 30 minutes of continuous aerobic without signs/symptoms of physical distress      Intensity   THRR 40-80% of Max Heartrate 53-106    Ratings of Perceived Exertion 11-13    Perceived Dyspnea 0-4      Progression   Progression Continue progressive overload as per policy without signs/symptoms or physical distress.      Resistance Training   Training Prescription Yes    Weight red bands    Reps 10-15             Discharge Exercise Prescription (Final Exercise  Prescription Changes):  Exercise Prescription Changes - 01/22/23 1500       Response to Exercise   Blood Pressure (Admit) 136/68    Blood Pressure (Exercise) 148/78    Blood Pressure (Exit) 121/76    Heart Rate (Admit) 88 bpm    Heart Rate (Exercise) 108 bpm    Heart Rate (Exit) 98 bpm    Oxygen Saturation (Admit) 98 %    Oxygen Saturation (Exercise) 99 %    Oxygen Saturation (Exit) 98 %    Rating of Perceived Exertion (Exercise) 13    Perceived Dyspnea (Exercise) 2    Duration Continue with 30 min of aerobic exercise without signs/symptoms of physical distress.    Intensity THRR unchanged      Progression   Progression Continue to progress workloads to maintain intensity without signs/symptoms of physical distress.      Resistance Training   Training Prescription Yes    Weight Red bands    Reps 10-15    Time 10 Minutes      NuStep   Level 5  SPM 70    Minutes 15    METs 2.9      Recumbant Elliptical   Level 5    RPM 70    Minutes 15    METs 5.4             Functional Capacity:  6 Minute Walk     Row Name 11/16/22 1206 01/24/23 1626       6 Minute Walk   Phase Initial Discharge    Distance 1190 feet 1500 feet    Distance % Change -- 26.05 %    Distance Feet Change -- 310 ft    Walk Time 6 minutes 6 minutes    # of Rest Breaks 0 0    MPH 2.25 2.84    METS 1.96 2.21    RPE 13 11    Perceived Dyspnea  3 1    VO2 Peak 6.86 7.72    Symptoms Yes (comment) No    Comments Pt c/o lightheadedness and shaky feeling 4 1/2 min in to walk test. Declined rest. Eventually resolved after 5 min rest. Sts she has had this feeling before when she gets SOB/exerted. (note:used forehead probe for SpO2) --    Resting HR 85 bpm 84 bpm    Resting BP 146/64 130/64    Resting Oxygen Saturation  100 % 99 %    Exercise Oxygen Saturation  during 6 min walk 100 % 95 %    Max Ex. HR 101 bpm 108 bpm    Max Ex. BP 150/60 120/62    2 Minute Post BP 138/60 120/60      Interval  HR   1 Minute HR 89 84    2 Minute HR 97 89    3 Minute HR 94 101    4 Minute HR 94 104    5 Minute HR 99 98    6 Minute HR 101 105    2 Minute Post HR 84 108    Interval Heart Rate? Yes Yes      Interval Oxygen   Interval Oxygen? Yes --    Baseline Oxygen Saturation % 100 % 99 %    1 Minute Oxygen Saturation % 100 % 99 %    1 Minute Liters of Oxygen 0 L 0 L    2 Minute Oxygen Saturation % 99 % 99 %    2 Minute Liters of Oxygen 0 L 0 L    3 Minute Oxygen Saturation % 100 % 98 %    3 Minute Liters of Oxygen 0 L 0 L    4 Minute Oxygen Saturation % 100 % 98 %    4 Minute Liters of Oxygen 0 L 0 L    5 Minute Oxygen Saturation % 100 % 100 %    5 Minute Liters of Oxygen 0 L 0 L    6 Minute Oxygen Saturation % 100 % 95 %    6 Minute Liters of Oxygen 0 L 0 L    2 Minute Post Oxygen Saturation % 100 % 98 %    2 Minute Post Liters of Oxygen 0 L 0 L             Psychological, QOL, Others - Outcomes: PHQ 2/9:    01/24/2023    3:13 PM 01/18/2023    2:24 PM 11/16/2022   11:06 AM  Depression screen PHQ 2/9  Decreased Interest 0 0 0  Down, Depressed, Hopeless 0 0 0  PHQ -  2 Score 0 0 0  Altered sleeping 0 0 0  Tired, decreased energy 0 0 0  Change in appetite 0 0 0  Feeling bad or failure about yourself  0 0 0  Trouble concentrating 0 0 0  Moving slowly or fidgety/restless 0 0 0  Suicidal thoughts 0 0 0  PHQ-9 Score 0 0 0  Difficult doing work/chores Not difficult at all      Quality of Life:   Personal Goals: Goals established at orientation with interventions provided to work toward goal.  Personal Goals and Risk Factors at Admission - 11/16/22 1111       Core Components/Risk Factors/Patient Goals on Admission    Weight Management Weight Maintenance    Improve shortness of breath with ADL's Yes    Intervention Provide education, individualized exercise plan and daily activity instruction to help decrease symptoms of SOB with activities of daily living.    Expected  Outcomes Short Term: Improve cardiorespiratory fitness to achieve a reduction of symptoms when performing ADLs              Personal Goals Discharge:  Goals and Risk Factor Review     Row Name 11/27/22 0858 12/25/22 0835 01/15/23 0833         Core Components/Risk Factors/Patient Goals Review   Personal Goals Review Weight Management/Obesity;Develop more efficient breathing techniques such as purse lipped breathing and diaphragmatic breathing and practicing self-pacing with activity.;Improve shortness of breath with ADL's;Increase knowledge of respiratory medications and ability to use respiratory devices properly. Weight Management/Obesity;Develop more efficient breathing techniques such as purse lipped breathing and diaphragmatic breathing and practicing self-pacing with activity.;Improve shortness of breath with ADL's;Increase knowledge of respiratory medications and ability to use respiratory devices properly. Weight Management/Obesity;Develop more efficient breathing techniques such as purse lipped breathing and diaphragmatic breathing and practicing self-pacing with activity.;Improve shortness of breath with ADL's;Increase knowledge of respiratory medications and ability to use respiratory devices properly.     Review Shannon Bowman has completed 1 exercise session. It is too soon to note any progresssions. Will continue to monitor. Shannon Bowman has completed 9 exercise session. She exercises on the recumbent bike and the NuStep. She is making progress in improving her intensity and METs. Her METs has gone from 2 to 2.6 on the recumbent bike and 2 to 2.9 on the NuStep. Shannon Bowman can verbalize her dyspnea scale as well as her rate of perceived exertion. She can demonstrate pursed lip breathing when she is short of breath. Shannon Bowman has also attended education classes on anatomy and physiology and exercise for the pulmonary patient. Shannon Bowman enjoys coming to class and socializing with her peers. Shannon Bowman has completed 9  exercise session. She exercises on the recumbent bike and the NuStep. She is making progress in improving her intensity and METs. Her METs has gone from 2 to 2.6 on the recumbent bike and 2 to 2.9 on the NuStep. Shannon Bowman can verbalize her dyspnea scale as well as her rate of perceived exertion. She can demonstrate pursed lip breathing when she is short of breath. Shannon Bowman has also attended education classes on anatomy and physiology and exercise for the pulmonary patient. Shannon Bowman enjoys coming to class and socializing with her peers.     Expected Outcomes See admission goals. See admission goals See admission goals              Exercise Goals and Review:  Exercise Goals     Row Name 11/16/22 1216 11/20/22 1558 12/25/22 [id number] 01/14/23 1217  Exercise Goals   Increase Physical Activity Yes Yes Yes Yes    Intervention Provide advice, education, support and counseling about physical activity/exercise needs.;Develop an individualized exercise prescription for aerobic and resistive training based on initial evaluation findings, risk stratification, comorbidities and participant's personal goals. Provide advice, education, support and counseling about physical activity/exercise needs.;Develop an individualized exercise prescription for aerobic and resistive training based on initial evaluation findings, risk stratification, comorbidities and participant's personal goals. Provide advice, education, support and counseling about physical activity/exercise needs.;Develop an individualized exercise prescription for aerobic and resistive training based on initial evaluation findings, risk stratification, comorbidities and participant's personal goals. Provide advice, education, support and counseling about physical activity/exercise needs.;Develop an individualized exercise prescription for aerobic and resistive training based on initial evaluation findings, risk stratification, comorbidities and participant's  personal goals.    Expected Outcomes Short Term: Attend rehab on a regular basis to increase amount of physical activity.;Long Term: Exercising regularly at least 3-5 days a week.;Long Term: Add in home exercise to make exercise part of routine and to increase amount of physical activity. Short Term: Attend rehab on a regular basis to increase amount of physical activity.;Long Term: Exercising regularly at least 3-5 days a week.;Long Term: Add in home exercise to make exercise part of routine and to increase amount of physical activity. Short Term: Attend rehab on a regular basis to increase amount of physical activity.;Long Term: Exercising regularly at least 3-5 days a week.;Long Term: Add in home exercise to make exercise part of routine and to increase amount of physical activity. Short Term: Attend rehab on a regular basis to increase amount of physical activity.;Long Term: Exercising regularly at least 3-5 days a week.;Long Term: Add in home exercise to make exercise part of routine and to increase amount of physical activity.    Increase Strength and Stamina Yes Yes Yes Yes    Intervention Provide advice, education, support and counseling about physical activity/exercise needs.;Develop an individualized exercise prescription for aerobic and resistive training based on initial evaluation findings, risk stratification, comorbidities and participant's personal goals. Provide advice, education, support and counseling about physical activity/exercise needs.;Develop an individualized exercise prescription for aerobic and resistive training based on initial evaluation findings, risk stratification, comorbidities and participant's personal goals. Provide advice, education, support and counseling about physical activity/exercise needs.;Develop an individualized exercise prescription for aerobic and resistive training based on initial evaluation findings, risk stratification, comorbidities and participant's personal  goals. Provide advice, education, support and counseling about physical activity/exercise needs.;Develop an individualized exercise prescription for aerobic and resistive training based on initial evaluation findings, risk stratification, comorbidities and participant's personal goals.    Expected Outcomes Short Term: Increase workloads from initial exercise prescription for resistance, speed, and METs.;Short Term: Perform resistance training exercises routinely during rehab and add in resistance training at home;Long Term: Improve cardiorespiratory fitness, muscular endurance and strength as measured by increased METs and functional capacity (6MWT) Short Term: Increase workloads from initial exercise prescription for resistance, speed, and METs.;Short Term: Perform resistance training exercises routinely during rehab and add in resistance training at home;Long Term: Improve cardiorespiratory fitness, muscular endurance and strength as measured by increased METs and functional capacity (6MWT) Short Term: Increase workloads from initial exercise prescription for resistance, speed, and METs.;Short Term: Perform resistance training exercises routinely during rehab and add in resistance training at home;Long Term: Improve cardiorespiratory fitness, muscular endurance and strength as measured by increased METs and functional capacity (6MWT) Short Term: Increase workloads from initial exercise prescription for resistance, speed,  and METs.;Short Term: Perform resistance training exercises routinely during rehab and add in resistance training at home;Long Term: Improve cardiorespiratory fitness, muscular endurance and strength as measured by increased METs and functional capacity (6MWT)    Able to understand and use rate of perceived exertion (RPE) scale Yes Yes Yes Yes    Intervention Provide education and explanation on how to use RPE scale Provide education and explanation on how to use RPE scale Provide education and  explanation on how to use RPE scale Provide education and explanation on how to use RPE scale    Expected Outcomes Short Term: Able to use RPE daily in rehab to express subjective intensity level;Long Term:  Able to use RPE to guide intensity level when exercising independently Short Term: Able to use RPE daily in rehab to express subjective intensity level;Long Term:  Able to use RPE to guide intensity level when exercising independently Short Term: Able to use RPE daily in rehab to express subjective intensity level;Long Term:  Able to use RPE to guide intensity level when exercising independently Short Term: Able to use RPE daily in rehab to express subjective intensity level;Long Term:  Able to use RPE to guide intensity level when exercising independently    Able to understand and use Dyspnea scale Yes Yes Yes Yes    Intervention Provide education and explanation on how to use Dyspnea scale Provide education and explanation on how to use Dyspnea scale Provide education and explanation on how to use Dyspnea scale Provide education and explanation on how to use Dyspnea scale    Expected Outcomes Short Term: Able to use Dyspnea scale daily in rehab to express subjective sense of shortness of breath during exertion;Long Term: Able to use Dyspnea scale to guide intensity level when exercising independently Short Term: Able to use Dyspnea scale daily in rehab to express subjective sense of shortness of breath during exertion;Long Term: Able to use Dyspnea scale to guide intensity level when exercising independently Short Term: Able to use Dyspnea scale daily in rehab to express subjective sense of shortness of breath during exertion;Long Term: Able to use Dyspnea scale to guide intensity level when exercising independently Short Term: Able to use Dyspnea scale daily in rehab to express subjective sense of shortness of breath during exertion;Long Term: Able to use Dyspnea scale to guide intensity level when  exercising independently    Knowledge and understanding of Target Heart Rate Range (THRR) Yes Yes Yes Yes    Intervention Provide education and explanation of THRR including how the numbers were predicted and where they are located for reference Provide education and explanation of THRR including how the numbers were predicted and where they are located for reference Provide education and explanation of THRR including how the numbers were predicted and where they are located for reference Provide education and explanation of THRR including how the numbers were predicted and where they are located for reference    Expected Outcomes Short Term: Able to state/look up THRR;Long Term: Able to use THRR to govern intensity when exercising independently;Short Term: Able to use daily as guideline for intensity in rehab Short Term: Able to state/look up THRR;Long Term: Able to use THRR to govern intensity when exercising independently;Short Term: Able to use daily as guideline for intensity in rehab Short Term: Able to state/look up THRR;Long Term: Able to use THRR to govern intensity when exercising independently;Short Term: Able to use daily as guideline for intensity in rehab Short Term: Able to state/look up  THRR;Long Term: Able to use THRR to govern intensity when exercising independently;Short Term: Able to use daily as guideline for intensity in rehab    Understanding of Exercise Prescription Yes Yes Yes Yes    Intervention Provide education, explanation, and written materials on patient's individual exercise prescription Provide education, explanation, and written materials on patient's individual exercise prescription Provide education, explanation, and written materials on patient's individual exercise prescription Provide education, explanation, and written materials on patient's individual exercise prescription    Expected Outcomes Short Term: Able to explain program exercise prescription;Long Term: Able to  explain home exercise prescription to exercise independently Short Term: Able to explain program exercise prescription;Long Term: Able to explain home exercise prescription to exercise independently Short Term: Able to explain program exercise prescription;Long Term: Able to explain home exercise prescription to exercise independently Short Term: Able to explain program exercise prescription;Long Term: Able to explain home exercise prescription to exercise independently             Exercise Goals Re-Evaluation:  Exercise Goals Re-Evaluation     Row Name 11/26/22 1448 12/25/22 0943 01/14/23 1217         Exercise Goal Re-Evaluation   Exercise Goals Review Increase Physical Activity;Able to understand and use Dyspnea scale;Understanding of Exercise Prescription;Increase Strength and Stamina;Knowledge and understanding of Target Heart Rate Range (THRR);Able to understand and use rate of perceived exertion (RPE) scale Increase Physical Activity;Able to understand and use Dyspnea scale;Understanding of Exercise Prescription;Increase Strength and Stamina;Knowledge and understanding of Target Heart Rate Range (THRR);Able to understand and use rate of perceived exertion (RPE) scale Increase Physical Activity;Able to understand and use Dyspnea scale;Understanding of Exercise Prescription;Increase Strength and Stamina;Knowledge and understanding of Target Heart Rate Range (THRR);Able to understand and use rate of perceived exertion (RPE) scale     Comments Pt has had one exercise session, which she tolerated well, averaging 2.0 METs on the recumbent bike and Nustep. It is too early to discern any progressions. Will continue to monitor. Pt has had 9 exercise sessions. She has not missed. She is exercising 15 min on the recumbent bike and Nustep. She is on level 2 on the recumbent bike, METs 2.6. She is then exercising on the nustep at level 4, METs 2.1. She is progressing well. Pt has had 15 exercise sessions  with good attendance.  She is exercising 15 min on the recumbent stepper and recumbent elliptical. She is on level 5 on the recumbent stepper, METs 3.1. She is then exercising on the recumbent elliptical at level 5, METs 4.4. She has been very motivated and has progressed well.     Expected Outcomes Through exercise at rehab and home, the patient will decrease shortness of breath with daily activities and feel confident in carrying out an exercise regimen at home. Through exercise at rehab and home, the patient will decrease shortness of breath with daily activities and feel confident in carrying out an exercise regimen at home. Through exercise at rehab and home, the patient will decrease shortness of breath with daily activities and feel confident in carrying out an exercise regimen at home.              Nutrition & Weight - Outcomes:  Pre Biometrics - 11/16/22 1217       Pre Biometrics   Grip Strength 18 kg             Post Biometrics - 01/24/23 [place]  Grip Strength 20 kg             Nutrition:  Nutrition Therapy & Goals - 01/15/23 1540       Nutrition Therapy   Diet Heart Healthy diet    Drug/Food Interactions Statins/Certain Fruits      Personal Nutrition Goals   Nutrition Goal Patient to continue to use the plate method as a daily guide for meal planning to include lean protein/plant protein, fruits, vegetables whole grains, low fat dairy as part of well balanced diet.    Comments Shannon Bowman lives alone as her husband passed away 1.5 years ago. She reports no nutrition concerns at this time. She reports eating a wide variety of foods and eating 2-3 meals daily. Her daughters are very supportive and provide many meals for her. She continues regular follow-up with cardiology and does weigh daily. Her losartan/hctz was stopped on 08/14/22 due to low sodium concerns; she continues amlodipine and mindfulness of sodium intake. She continues regular  follow-up with pulmonary and PCP; she did recently re-start crestor.      Intervention Plan   Intervention Prescribe, educate and counsel regarding individualized specific dietary modifications aiming towards targeted core components such as weight, hypertension, lipid management, diabetes, heart failure and other comorbidities.;Nutrition handout(s) given to patient.    Expected Outcomes Short Term Goal: Understand basic principles of dietary content, such as calories, fat, sodium, cholesterol and nutrients.;Long Term Goal: Adherence to prescribed nutrition plan.             Nutrition Discharge:  Nutrition Assessments - 01/18/23 0825       Rate Your Plate Scores   Post Score 64             Education Questionnaire Score:  Knowledge Questionnaire Score - 01/18/23 1421       Knowledge Questionnaire Score   Post Score 13/18             Goals reviewed with patient; copy given to patient.

## 2023-02-10 ENCOUNTER — Other Ambulatory Visit: Payer: Self-pay | Admitting: Internal Medicine

## 2023-06-26 ENCOUNTER — Ambulatory Visit: Payer: Medicare Other | Admitting: Cardiology

## 2023-07-08 ENCOUNTER — Encounter: Payer: Self-pay | Admitting: Cardiology

## 2023-07-08 ENCOUNTER — Ambulatory Visit: Payer: Medicare Other | Admitting: Cardiology

## 2023-07-08 VITALS — BP 165/70 | HR 80 | Resp 16 | Ht 60.0 in | Wt 129.8 lb

## 2023-07-08 DIAGNOSIS — I10 Essential (primary) hypertension: Secondary | ICD-10-CM

## 2023-07-08 DIAGNOSIS — R0609 Other forms of dyspnea: Secondary | ICD-10-CM

## 2023-07-08 DIAGNOSIS — I951 Orthostatic hypotension: Secondary | ICD-10-CM

## 2023-07-08 NOTE — Progress Notes (Signed)
Primary Physician/Referring:  Charlane Ferretti, DO  Patient ID: Shannon Bowman, female    DOB: Oct 29, 1932, 89 y.o.   MRN: [id number]  Chief Complaint  Patient presents with   Shortness of Breath   Dizziness    HPI:    Shannon Bowman  is a 89 y.o. female with past medical history significant for CKD, hypertension, and hyperlipidemia, and TIA in Aug 2023 presents here for follow-up of dyspnea on exertion and chronic dizziness.    She denies chest pain, palpitations, diaphoresis, syncope, claudication.  Past Medical History:  Diagnosis Date   Arthritis    Cataract    right eye   Cataract, nuclear sclerotic, left eye 06/02/2020   The nature of cataract was discussed with the patient as well as the elective nature of surgery. The patient was reassured that surgery at a later date does not put the patient at risk for a worse outcome. It was emphasized that the need for surgery is dictated by the patient's quality of life as influenced by the cataract. Patient was instructed to maintain close follow up with their general eye    Chronic kidney disease    states Dr. Nehemiah Settle told her CKD stage 3   GERD (gastroesophageal reflux disease)    History of hiatal hernia    Spinal stenosis    TIA (transient ischemic attack) 2023   Past Surgical History:  Procedure Laterality Date   COLONOSCOPY     eyelid surgery     to improve drooping of eyelids   INGUINAL HERNIA REPAIR N/A 07/29/2015   Procedure: LAPAROSCOPIC INGUINAL HERNIA REPAIR WITH MESH;  Surgeon: Gaynelle Adu, MD;  Location: [facility] OR;  Service: General;  Laterality: N/A;   MULTIPLE TOOTH EXTRACTIONS     TONSILLECTOMY     UPPER GI ENDOSCOPY     Family History  Problem Relation Age of Onset   Stroke Mother    Diabetes Mother    Stroke Father     Social History   Tobacco Use   Smoking status: Former    Current packs/day: 0.00    Average packs/day: 0.1 packs/day for 3.0 years (0.3 ttl pk-yrs)    Types: Cigarettes    Start date:  12/10/1960    Quit date: 12/11/1963    Years since quitting: 59.6   Smokeless tobacco: Never  Substance Use Topics   Alcohol use: Yes    Comment: 1/day (wine)   Marital Status: Widowed  ROS  Review of Systems  Cardiovascular:  Positive for dyspnea on exertion (worsening on and off). Negative for chest pain, leg swelling and palpitations.  Neurological:  Positive for dizziness and light-headedness.  All other systems reviewed and are negative.  Objective  Blood pressure (!) 165/70, pulse 80, resp. rate 16, height 5' (1.524 m), weight 129 lb 12.8 oz (58.9 kg), SpO2 95%. Body mass index is 25.35 kg/m.     07/08/2023    3:57 PM 01/22/2023    3:55 PM 01/08/2023    3:30 PM  Vitals with BMI  Height 5\' 0"     Weight 129 lbs 13 oz 129 lbs 10 oz 127 lbs 7 oz  BMI 25.35  24.89  Systolic 165    Diastolic 70    Pulse 80      Orthostatic VS for the past 72 hrs (Last 3 readings):  Orthostatic BP Patient Position BP Location Cuff Size Orthostatic Pulse  07/08/23 1718 130/76 Standing -- -- --  07/08/23 1717 168/80 Supine Left Arm -- --  07/08/23 1601 153/73 Sitting Left Arm Normal 77  07/08/23 1557 -- Sitting Left Arm Normal --     Physical Exam Neck:     Vascular: No carotid bruit or JVD.  Cardiovascular:     Rate and Rhythm: Normal rate and regular rhythm.     Pulses: Intact distal pulses.     Heart sounds: Normal heart sounds. No murmur heard.    No gallop.  Pulmonary:     Effort: Pulmonary effort is normal.     Breath sounds: Normal breath sounds.  Abdominal:     General: Bowel sounds are normal.     Palpations: Abdomen is soft.  Musculoskeletal:     Right lower leg: No edema.     Left lower leg: No edema.    Medications and allergies   Allergies  Allergen Reactions   Phenylephrine Other (See Comments)    Possible allergy to dilating drop phenylephrine 2.5% - USE Tropicamide 1% only to dilate Possible allergy to dilating drop phenylephrine 2.5% - USE Tropicamide 1% only to  dilate    Penicillins Rash     Medication list after today's encounter   Current Outpatient Medications:    acetaminophen (TYLENOL) 325 MG tablet, Take 650 mg by mouth every 6 (six) hours as needed for mild pain., Disp: , Rfl:    amLODipine (NORVASC) 5 MG tablet, TAKE 1 TABLET BY MOUTH AT  BEDTIME, Disp: 90 tablet, Rfl: 3   ascorbic acid (VITAMIN C) 500 MG tablet, Take 500 mg by mouth daily., Disp: , Rfl:    aspirin EC 81 MG tablet, Take 81 mg by mouth daily. Swallow whole., Disp: , Rfl:    betamethasone valerate (VALISONE) 0.1 % cream, Apply 1 Application topically daily., Disp: , Rfl:    calcium carbonate (OS-CAL) 600 MG TABS, Take 600 mg by mouth 2 (two) times daily with a meal., Disp: , Rfl:    cetirizine (ZYRTEC) 10 MG tablet, Take 10 mg by mouth daily as needed for allergies. , Disp: , Rfl:    Cholecalciferol (VITAMIN D3) 25 MCG (1000 UT) CAPS, Take by mouth., Disp: , Rfl:    estradiol (ESTRACE) 0.1 MG/GM vaginal cream, Place 1 Applicatorful vaginally once a week., Disp: , Rfl:    ezetimibe (ZETIA) 10 MG tablet, Take 1 tablet (10 mg total) by mouth daily., Disp: , Rfl:    losartan-hydrochlorothiazide (HYZAAR) 50-12.5 MG tablet, TAKE 1 TABLET BY MOUTH EVERY  MORNING, Disp: 90 tablet, Rfl: 3   omeprazole (PRILOSEC) 40 MG capsule, Take 40 mg by mouth as needed., Disp: , Rfl:    Probiotic CAPS, Take 2 capsules by mouth daily., Disp: , Rfl:    Propylene Glycol (SYSTANE BALANCE OP), Apply 1 drop to eye at bedtime., Disp: , Rfl:    rosuvastatin (CRESTOR) 20 MG tablet, TAKE 1 TABLET BY MOUTH DAILY, Disp: 90 tablet, Rfl: 3  Laboratory examination:   External labs:   Labs 07/23/2022:  Hb 14.0/HCT 38.5, platelets 252, normal indicis.  Serum glucose: 11 mg, BUN 15, creatinine 0.98, EGFR 56,, sodium 134, potassium 5.0, LFTs normal.  Cholesterol, total 183.000 m 07/23/2022 HDL 50.000 mg 07/23/2022 LDL 118.000 m 07/23/2022 Triglycerides 74.000 mg 07/23/2022  Hemoglobin 12.600 g/d  02/23/2021  Creatinine, Serum 0.980 mg/ 07/21/2022 Potassium 4.500 mEq 10/31/2022 ALT (SGPT) 14.000 IU/ 10/24/2022  TSH 3.190 10/24/2022, vitamin D 59.0.  Radiology:   CT Angiogram Chest 10/25/2022:  Cardiovascular: No evidence of pulmonary embolus. Heart is upper limits of normal in size. No pericardial effusion.  Normal caliber thoracic aorta with moderate atherosclerotic disease.   Mediastinum/Nodes: Small hiatal hernia. Thyroid is unremarkable. No pathologically enlarged lymph nodes seen in the chest.   Lungs/Pleura: Central airways are patent. No consolidation, pleural effusion or pneumothorax mild bibasilar atelectasis.  Cardiac Studies:   Echo 07/24/2022  1. Left ventricular ejection fraction, by estimation, is 60 to 65%. The left ventricle has normal function. The left ventricle has no regional wall motion abnormalities. There is severe left ventricular hypertrophy. Left ventricular diastolic parameters are consistent with Grade I diastolic dysfunction (impaired relaxation). 2. Right ventricular systolic function is normal. The right ventricular size is normal. 3. The mitral valve is abnormal. Mild mitral valve regurgitation. No evidence of mitral stenosis. Moderate mitral annular calcification. 4. The aortic valve is tricuspid. There is moderate calcification of the aortic valve. Aortic valve regurgitation is mild. Aortic valve sclerosis/calcification is present, without any evidence of aortic stenosis. 5. The inferior vena cava is normal in size with greater than 50% respiratory variability, suggesting right atrial pressure of 3 mmHg.  Renal artery duplex 10/04/2022:  No evidence of renal artery occlusive disease in either renal artery.  Normal intrarenal vascular perfusion is noted in both kidneys.  Renal length is within normal limits for both kidneys.  Mild diffuse plaque noted in the abdominal aorta.    Exercise nuclear stress test 09/18/2022: Myocardial perfusion is  normal. Overall LV systolic function is normal without regional wall motion abnormalities. Stress LV EF: 70%.  Normal ECG stress. The patient exercised for 2 minutes and 35 seconds of a Bruce protocol, achieving approximately 4.64 METs. and 90% of MPHR. Poor exercise tolerance. The blood pressure response was normal. No previous exam available for comparison. Low risk.    Carotid artery duplex 08/17/2022:  Duplex suggests stenosis in the right internal carotid artery (1-15%).  Duplex suggests stenosis in the left internal carotid artery (1-15%).  Minimal heterogeneous plaque noted in carotid arteries.  Antegrade right vertebral artery flow. Antegrade left vertebral artery  flow.  EKG:   EKG 08/17/2022: Normal sinus rhythm at rate of 74 bpm, normal axis, poor R progression, probably normal variant.  T wave abnormality, cannot exclude inferior ischemia.  PVCs (2).  Assessment     ICD-10-CM   1. Supine hypertension  I10     2. Orthostatic hypotension  I95.1     3. DOE (dyspnea on exertion)  R06.09      No orders of the defined types were placed in this encounter.   No orders of the defined types were placed in this encounter.   There are no discontinued medications.     Recommendations:   Shannon Bowman is a 89 y.o. female with past medical history significant for CKD, hypertension, and hyperlipidemia, and TIA in Aug 2023 presents here for follow-up of dyspnea on exertion and chronic dizziness.   1. Supine hypertension Patient is a fairly active 89 year old female who now established with me, previously seen Custovic, DO, extremely complex presentation with marked worsening dyspnea even doing minimal activity, and severe lifestyle limiting dizziness.  Upon evaluation, she clearly has supine hypertension and orthostatic hypotension with a 50 mmHg drop in blood pressure.  She is on low-dose of losartan HCT in the morning and amlodipine 5 mg in the evening, I would certainly keep  this as her standing blood pressure is normal.  I have educated the patient regarding the difficulty in treating supine hypertension and orthostatic hypotension.  I have also discussed with her to lay  in bed in a reclined position so to decrease the effects of hypertension.  She has already had prior stroke in the past.  2. Orthostatic hypotension I advised her to treat her blood pressure only standing and not sitting or laying down.  She will continue to monitor her blood pressure at home and record and see if she has significant changes with orthostasis.  3. DOE (dyspnea on exertion) Mild dyspnea on exertion, she has been extensively evaluated by pulmonary medicine and in fact she underwent pulmonary rehab as well.  Her PFTs in 2023 were normal, CT angiogram of the chest revealed no evidence of pulmonary artery dilatation suggestive of pulmonary hypertension.  Echocardiogram also does not reveal any evidence of RV strain or RV dilatation.  Extremely difficult to state her etiology for her dyspnea.  Also there is no significant coronary calcification, she does have mild aortic atherosclerosis.  Hence my suspicion for significant CAD is low and she is already on a high intensity statin.  At this point I am becoming to know her, I would like to see her back in 6 to 8 weeks to evaluate her symptoms again, could consider right and left heart catheterization as a last resort.  I spent 40 minutes with the patient in evaluation of his symptoms, evaluation of external records and evaluation of labs externally.  Other orders - betamethasone valerate (VALISONE) 0.1 % cream; Apply 1 Application topically daily.   Yates Decamp, MD, [facility] 07/08/2023, 6:11 PM Office: [phone] Fax: [phone] Pager: [phone]

## 2023-07-23 ENCOUNTER — Ambulatory Visit (INDEPENDENT_AMBULATORY_CARE_PROVIDER_SITE_OTHER): Payer: Medicare Other | Admitting: Pulmonary Disease

## 2023-07-23 ENCOUNTER — Encounter: Payer: Self-pay | Admitting: Pulmonary Disease

## 2023-07-23 VITALS — BP 138/84 | HR 68 | Temp 96.9°F | Ht 61.0 in | Wt 130.6 lb

## 2023-07-23 DIAGNOSIS — J4520 Mild intermittent asthma, uncomplicated: Secondary | ICD-10-CM

## 2023-07-23 NOTE — Patient Instructions (Addendum)
Continue advair HFA 115-64mcg 2 puffs twice daily  - rinse mouth out well after each use  Follow up in 1 year, call sooner if needed

## 2023-07-23 NOTE — Progress Notes (Signed)
Synopsis: Referred in November 2023 for shortness of breath by Clotilde Dieter, DO  Subjective:   PATIENT ID: Shannon Bowman GENDER: female DOB: 1932/04/18, MRN: [id number]  HPI  Chief Complaint  Patient presents with   Follow-up    SOB,DOE    Shannon Bowman is an 89 year old woman, former smoker with GERD, hiatal hernia and TIA in August who returns to pulmonary clinic for mild intermittent asthma.   She continues to have shortness of breath with exertion. She is using advair hfa 115-34mcg 2 puffs twice daily. She completed pulmonary rehab earlier this year. She has issues with positional changes in her blood pressure that seem to be her biggest issues at this time.   OV 12/19/2022 She was started on advair 115/38mcg 2 puffs twice daily at last visit with improvement in her breathing. She started to use it as needed instead of scheduled. She feels pulmonary rehab has helped with her shortness of breath.   PFTs are within normal limits today.  Initial OV 10/16/22 She reports having shortness of breath when bathing herself and getting dressed. She also notices the dyspnea when cleaning her home. Recently she had to stop to rest multiple times cleaning her den. She reports episodes where she stops to take deep breaths and will shake all over. She has not lost consciousness. She has a constant cough where she is clearing her throat. The cough can be productive of clear phlegm in the mornings. She denies night time awakenings due to the cough or dyspnea. She is sleeping flat on her mattress.   She smoked in the past for a few years. She was exposed to second hand smoke in childhood and in social settings. She does not have occupational exposures to dusts or chemicals. Denies any hobbies with dusts or chemical exposures. Her mother had rheumatoid arthritis and a cousin with RA. She does complain of joint pains in her hands.   Past Medical History:  Diagnosis Date   Arthritis    Cataract     right eye   Cataract, nuclear sclerotic, left eye 06/02/2020   The nature of cataract was discussed with the patient as well as the elective nature of surgery. The patient was reassured that surgery at a later date does not put the patient at risk for a worse outcome. It was emphasized that the need for surgery is dictated by the patient's quality of life as influenced by the cataract. Patient was instructed to maintain close follow up with their general eye    Chronic kidney disease    states Dr. Nehemiah Settle told her CKD stage 3   GERD (gastroesophageal reflux disease)    History of hiatal hernia    Spinal stenosis    TIA (transient ischemic attack) 2023     Family History  Problem Relation Age of Onset   Stroke Mother    Diabetes Mother    Stroke Father      Social History   Socioeconomic History   Marital status: Widowed    Spouse name: Not on file   Number of children: 2   Years of education: Not on file   Highest education level: Not on file  Occupational History   Not on file  Tobacco Use   Smoking status: Former    Current packs/day: 0.00    Average packs/day: 0.1 packs/day for 3.0 years (0.3 ttl pk-yrs)    Types: Cigarettes    Start date: 12/10/1960    Quit date:  12/11/1963    Years since quitting: 59.6   Smokeless tobacco: Never  Vaping Use   Vaping status: Never Used  Substance and Sexual Activity   Alcohol use: Yes    Comment: 1/day (wine)   Drug use: No   Sexual activity: Not on file  Other Topics Concern   Not on file  Social History Narrative   Likes to go shopping, and socialize with friends and family, likes to read   Social Determinants of Health   Financial Resource Strain: Not on file  Food Insecurity: Not on file  Transportation Needs: Not on file  Physical Activity: Not on file  Stress: Not on file  Social Connections: Not on file  Intimate Partner Violence: Not on file     Allergies  Allergen Reactions   Phenylephrine Other (See Comments)     Possible allergy to dilating drop phenylephrine 2.5% - USE Tropicamide 1% only to dilate Possible allergy to dilating drop phenylephrine 2.5% - USE Tropicamide 1% only to dilate    Penicillins Rash     Outpatient Medications Prior to Visit  Medication Sig Dispense Refill   acetaminophen (TYLENOL) 325 MG tablet Take 650 mg by mouth every 6 (six) hours as needed for mild pain.     amLODipine (NORVASC) 5 MG tablet TAKE 1 TABLET BY MOUTH AT  BEDTIME 90 tablet 3   ascorbic acid (VITAMIN C) 500 MG tablet Take 500 mg by mouth daily.     aspirin EC 81 MG tablet Take 81 mg by mouth daily. Swallow whole.     betamethasone valerate (VALISONE) 0.1 % cream Apply 1 Application topically daily.     calcium carbonate (OS-CAL) 600 MG TABS Take 600 mg by mouth 2 (two) times daily with a meal.     cetirizine (ZYRTEC) 10 MG tablet Take 10 mg by mouth daily as needed for allergies.      Cholecalciferol (VITAMIN D3) 25 MCG (1000 UT) CAPS Take by mouth.     estradiol (ESTRACE) 0.1 MG/GM vaginal cream Place 1 Applicatorful vaginally once a week.     ezetimibe (ZETIA) 10 MG tablet Take 1 tablet (10 mg total) by mouth daily.     losartan-hydrochlorothiazide (HYZAAR) 50-12.5 MG tablet TAKE 1 TABLET BY MOUTH EVERY  MORNING 90 tablet 3   omeprazole (PRILOSEC) 40 MG capsule Take 40 mg by mouth as needed.     Probiotic CAPS Take 2 capsules by mouth daily.     Propylene Glycol (SYSTANE BALANCE OP) Apply 1 drop to eye at bedtime.     rosuvastatin (CRESTOR) 20 MG tablet TAKE 1 TABLET BY MOUTH DAILY 90 tablet 3   No facility-administered medications prior to visit.   Review of Systems  Constitutional:  Negative for chills, fever, malaise/fatigue and weight loss.  HENT:  Negative for congestion, sinus pain and sore throat.   Eyes: Negative.   Respiratory:  Negative for cough, hemoptysis, sputum production, shortness of breath and wheezing.   Cardiovascular:  Negative for chest pain, palpitations, orthopnea,  claudication and leg swelling.  Gastrointestinal:  Negative for abdominal pain, heartburn, nausea and vomiting.  Genitourinary: Negative.   Musculoskeletal:  Negative for myalgias.  Skin:  Negative for rash.  Neurological:  Negative for weakness.  Endo/Heme/Allergies: Negative.   Psychiatric/Behavioral: Negative.     Objective:   Vitals:   07/23/23 1445  BP: 138/84  Pulse: 68  Temp: (!) 96.9 F (36.1 C)  TempSrc: Temporal  SpO2: 95%  Weight: 130 lb 9.6 oz (59.2  kg)  Height: 5\' 1"  (1.549 m)     Physical Exam Constitutional:      General: She is not in acute distress.    Appearance: She is not ill-appearing.  HENT:     Head: Normocephalic and atraumatic.  Eyes:     General: No scleral icterus.    Conjunctiva/sclera: Conjunctivae normal.  Cardiovascular:     Rate and Rhythm: Normal rate and regular rhythm.     Pulses: Normal pulses.     Heart sounds: Normal heart sounds. No murmur heard. Pulmonary:     Effort: Pulmonary effort is normal.     Breath sounds: Normal breath sounds. No wheezing, rhonchi or rales.  Musculoskeletal:     Right lower leg: No edema.     Left lower leg: No edema.  Lymphadenopathy:     Cervical: No cervical adenopathy.  Skin:    General: Skin is warm and dry.  Neurological:     General: No focal deficit present.     Mental Status: She is alert.    CBC    Component Value Date/Time   WBC 5.6 07/21/2015 1335   RBC 4.05 07/21/2015 1335   HGB 13.0 07/21/2015 1335   HCT 38.3 07/21/2015 1335   PLT 200 07/21/2015 1335   MCV 94.6 07/21/2015 1335   MCH 32.1 07/21/2015 1335   MCHC 33.9 07/21/2015 1335   RDW 12.4 07/21/2015 1335   Chest imaging: CXR 09/26/22 Cardiomediastinal silhouette within normal limits in size and contour. No evidence of central vascular congestion. No interlobular septal thickening.   Airspace opacity in the upper right and lower right lung with no comparison. Coarsened interstitial markings.   No pneumothorax or  pleural effusion.   No acute displaced fracture. Degenerative changes of the spine  PFT:    Latest Ref Rng & Units 12/19/2022    2:38 PM  PFT Results  FVC-Pre L 2.20   FVC-Predicted Pre % 120   FVC-Post L 2.12   FVC-Predicted Post % 115   Pre FEV1/FVC % % 77   Post FEV1/FCV % % 80   FEV1-Pre L 1.69   FEV1-Predicted Pre % 127   FEV1-Post L 1.70   DLCO uncorrected ml/min/mmHg 13.81   DLCO UNC% % 85   DLCO corrected ml/min/mmHg 13.81   DLCO COR %Predicted % 85   DLVA Predicted % 80   TLC L 4.06   TLC % Predicted % 89   RV % Predicted % 79     Labs:  Path:  Echo:  Heart Catheterization:  Assessment & Plan:   Mild intermittent asthma without complication  Discussion: Shannon Bowman is an 89 year old woman, former smoker with GERD, hiatal hernia and TIA in August who returns to pulmonary clinic for mild intermittent asthma.   She is to continue advair HFA 115-66mcg 2 puffs twice daily as needed. Pfts are within normal limits.  Follow up in 1 year.  Melody Comas, MD [facility] Office: [phone]   Current Outpatient Medications:    acetaminophen (TYLENOL) 325 MG tablet, Take 650 mg by mouth every 6 (six) hours as needed for mild pain., Disp: , Rfl:    amLODipine (NORVASC) 5 MG tablet, TAKE 1 TABLET BY MOUTH AT  BEDTIME, Disp: 90 tablet, Rfl: 3   ascorbic acid (VITAMIN C) 500 MG tablet, Take 500 mg by mouth daily., Disp: , Rfl:    aspirin EC 81 MG tablet, Take 81 mg by mouth daily. Swallow whole., Disp: ,  Rfl:    betamethasone valerate (VALISONE) 0.1 % cream, Apply 1 Application topically daily., Disp: , Rfl:    calcium carbonate (OS-CAL) 600 MG TABS, Take 600 mg by mouth 2 (two) times daily with a meal., Disp: , Rfl:    cetirizine (ZYRTEC) 10 MG tablet, Take 10 mg by mouth daily as needed for allergies. , Disp: , Rfl:    Cholecalciferol (VITAMIN D3) 25 MCG (1000 UT) CAPS, Take by mouth., Disp: , Rfl:    estradiol (ESTRACE) 0.1 MG/GM  vaginal cream, Place 1 Applicatorful vaginally once a week., Disp: , Rfl:    ezetimibe (ZETIA) 10 MG tablet, Take 1 tablet (10 mg total) by mouth daily., Disp: , Rfl:    losartan-hydrochlorothiazide (HYZAAR) 50-12.5 MG tablet, TAKE 1 TABLET BY MOUTH EVERY  MORNING, Disp: 90 tablet, Rfl: 3   omeprazole (PRILOSEC) 40 MG capsule, Take 40 mg by mouth as needed., Disp: , Rfl:    Probiotic CAPS, Take 2 capsules by mouth daily., Disp: , Rfl:    Propylene Glycol (SYSTANE BALANCE OP), Apply 1 drop to eye at bedtime., Disp: , Rfl:    rosuvastatin (CRESTOR) 20 MG tablet, TAKE 1 TABLET BY MOUTH DAILY, Disp: 90 tablet, Rfl: 3

## 2023-07-31 ENCOUNTER — Encounter: Payer: Self-pay | Admitting: Pulmonary Disease

## 2023-08-05 ENCOUNTER — Telehealth: Payer: Self-pay

## 2023-08-05 NOTE — Telephone Encounter (Unsigned)
Patient called and stated that she has been monitoring her blood pressure after a dizzy spell. She stated that her BP  110/66  HR 78  93/57 HR 81

## 2023-09-09 ENCOUNTER — Ambulatory Visit: Payer: Medicare Other | Admitting: Cardiology

## 2023-09-13 ENCOUNTER — Telehealth: Payer: Self-pay | Admitting: Cardiology

## 2023-09-13 NOTE — Telephone Encounter (Signed)
Left message for patient to call back  

## 2023-09-13 NOTE — Telephone Encounter (Signed)
Pt c/o BP issue: STAT if pt c/o blurred vision, one-sided weakness or slurred speech  1. What are your last 5 BP readings?   09/27 - 130/60 hr 81 - Sitting 101/54 hr 90 - Standing  09/30 102/51 hr 87 - Sitting 104/55 hr 78 - Standing   Patient stated she had more readings but was not prepared so this was what she had at the moment.   2. Are you having any other symptoms (ex. Dizziness, headache, blurred vision, passed out)? Dizziness, lightheaded, when bends over feels like she will pass out.   3. What is your BP issue? Patient states when she stands up from sitting down, her bp will drop and her hr will go up. Patient states she is concerned because she will get very lightheaded when she goes to stand up. Patient states she can feel her heart racing and fluttering. Patient states she has not passed out, but she gets a place to sit down to help prevent her from passing out. Please advise.

## 2023-09-13 NOTE — Telephone Encounter (Signed)
Spoke with pt for several minutes re message Pt dropped off a list of B/P back in August and has not heard anything Per pt on occasion can have  pain start in ears radiate to jaw making its way down to chest and then across back  this is occurring more often and no matter the activity  day or noc Pt gave some B/P readings 113/49 HR 78 140/61  92  124/63  87 117/59  84  and the last one pt out doing yard work and felt "dizzy and shaky" B/P was 88/53 HR 95  Moved appt up with Evan to 10/07/23 at 10:30  Will forward to Dr Jacinto Halim for review and recommendations./cy

## 2023-09-13 NOTE — Telephone Encounter (Signed)
Patient is returning your call. Please advise. ?

## 2023-09-14 NOTE — Telephone Encounter (Signed)
She has supine hypertension and orthostatic hypotension. Discontinue Amlodipine 5 mg and switch to metoprolol succinate 25 mg in the evening please and lets  see what she does when she comes in on 10/28.

## 2023-09-16 MED ORDER — METOPROLOL SUCCINATE ER 25 MG PO TB24: 25.0000 mg | ORAL_TABLET | Freq: Every day | ORAL | 3 refills | Status: DC

## 2023-09-16 NOTE — Telephone Encounter (Signed)
Spoke with the patient and gave recommendations from Dr. Jacinto Halim.  Patient also reports that she is not taking losartan-HCTZ

## 2023-09-25 ENCOUNTER — Telehealth: Payer: Self-pay | Admitting: Cardiology

## 2023-09-25 NOTE — Telephone Encounter (Signed)
Spoke with [place], [place] from [facility] and patient. Soni reports patient appears to have positive orthostatic blood pressures and also has chronic SOB on exertion. New symptoms is dizziness, lightheadedness and chest pressure. Has not started on metoprolol because she is worried it will worsen her symptoms. Paitne confirmed she can attend appt on 10/17//25 at 8:25 AM.

## 2023-09-25 NOTE — Telephone Encounter (Signed)
Pt c/o BP issue: STAT if pt c/o blurred vision, one-sided weakness or slurred speech  1. What are your last 5 BP readings?  128/80 - Sitting HR 92 102/68 - Standing HR 90 HR 70-128  2. Are you having any other symptoms (ex. Dizziness, headache, blurred vision, passed out)? HR 70-128  3. What is your BP issue?  Romilda Garret, PA at [facility] is calling to report patients vitals while visiting with patient today. She wanted patient to be scheduled earlier than what patient was previously scheduled and to make the office aware of patients readings for today.   Patient's appt is scheduled for 10/17

## 2023-09-26 ENCOUNTER — Ambulatory Visit: Payer: Medicare Other | Attending: Physician Assistant | Admitting: Cardiology

## 2023-09-26 ENCOUNTER — Ambulatory Visit: Payer: Medicare Other | Attending: Cardiology

## 2023-09-26 ENCOUNTER — Other Ambulatory Visit: Payer: Self-pay | Admitting: Cardiology

## 2023-09-26 ENCOUNTER — Encounter: Payer: Self-pay | Admitting: Cardiology

## 2023-09-26 VITALS — BP 130/74 | HR 78 | Ht 61.0 in | Wt 123.0 lb

## 2023-09-26 DIAGNOSIS — R002 Palpitations: Secondary | ICD-10-CM

## 2023-09-26 DIAGNOSIS — R072 Precordial pain: Secondary | ICD-10-CM

## 2023-09-26 DIAGNOSIS — G459 Transient cerebral ischemic attack, unspecified: Secondary | ICD-10-CM

## 2023-09-26 DIAGNOSIS — R0609 Other forms of dyspnea: Secondary | ICD-10-CM | POA: Diagnosis not present

## 2023-09-26 DIAGNOSIS — I10 Essential (primary) hypertension: Secondary | ICD-10-CM

## 2023-09-26 DIAGNOSIS — I517 Cardiomegaly: Secondary | ICD-10-CM

## 2023-09-26 DIAGNOSIS — I951 Orthostatic hypotension: Secondary | ICD-10-CM

## 2023-09-26 DIAGNOSIS — R42 Dizziness and giddiness: Secondary | ICD-10-CM

## 2023-09-26 DIAGNOSIS — I499 Cardiac arrhythmia, unspecified: Secondary | ICD-10-CM

## 2023-09-26 NOTE — Patient Instructions (Addendum)
Medication Instructions:  Your physician recommends that you continue on your current medications as directed. Please refer to the Current Medication list given to you today.  *If you need a refill on your cardiac medications before your next appointment, please call your pharmacy*   Lab Work: TODAY:  BMET, CBC, TSH, & MAG  If you have labs (blood work) drawn today and your tests are completely normal, you will receive your results only by: MyChart Message (if you have MyChart) OR A paper copy in the mail If you have any lab test that is abnormal or we need to change your treatment, we will call you to review the results.   Testing/Procedures: Your physician has requested that you have an echocardiogram. Echocardiography is a painless test that uses sound waves to create images of your heart. It provides your doctor with information about the size and shape of your heart and how well your heart's chambers and valves are working. This procedure takes approximately one hour. There are no restrictions for this procedure. Please do NOT wear cologne, perfume, aftershave, or lotions (deodorant is allowed). Please arrive 15 minutes prior to your appointment time.   ZIO AT Long term monitor-Live Telemetry  Your physician has requested you wear a ZIO patch monitor for 14 days.  This is a single patch monitor. [employer] supplies one patch monitor per enrollment. Additional  stickers are not available.  Please do not apply patch if you will be having a Nuclear Stress Test, Echocardiogram, Cardiac CT, MRI,  or Chest Xray during the period you would be wearing the monitor. The patch cannot be worn during  these tests. You cannot remove and re-apply the ZIO AT patch monitor.  Your ZIO patch monitor will be mailed 3 day USPS to your address on file. It may take 3-5 days to  receive your monitor after you have been enrolled.  Once you have received your monitor, please review the enclosed instructions.  Your monitor has  already been registered assigning a specific monitor serial # to you.   Billing and Patient Assistance Program information  Meredeth Ide has been supplied with any insurance information on record for billing. [employer] offers a sliding scale Patient Assistance Program for patients without insurance, or whose  insurance does not completely cover the cost of the ZIO patch monitor. You must apply for the  Patient Assistance Program to qualify for the discounted rate. To apply, call [employer] at [phone],  select option 4, select option 2 , ask to apply for the Patient Assistance Program, (you can request an  interpreter if needed). [employer] will ask your household income and how many people are in your  household. [employer] will quote your out-of-pocket cost based on this information. They will also be able  to set up a 12 month interest free payment plan if needed.  Applying the monitor   Shave hair from upper left chest.  Hold the abrader disc by orange tab. Rub the abrader in 40 strokes over left upper chest as indicated in  your monitor instructions.  Clean area with 4 enclosed alcohol pads. Use all pads to ensure the area is cleaned thoroughly. Let  dry.  Apply patch as indicated in monitor instructions. Patch will be placed under collarbone on left side of  chest with arrow pointing upward.  Rub patch adhesive wings for 2 minutes. Remove the white label marked "1". Remove the white label  marked "2". Rub patch adhesive wings for 2 additional minutes.  While  looking in a mirror, press and release button in center of patch. A small green light will flash 3-4  times. This will be your only indicator that the monitor has been turned on.  Do not shower for the first 24 hours. You may shower after the first 24 hours.  Press the button if you feel a symptom. You will hear a small click. Record Date, Time and Symptom in  the Patient Log.   Starting the Gateway  In your kit there  is a Audiological scientist box the size of a cellphone. This is Buyer, retail. It transmits all your  recorded data to [facility]. This box must always stay within 10 feet of you. Open the box and push the *  button. There will be a light that blinks orange and then green a few times. When the light stops  blinking, the Gateway is connected to the ZIO patch. Call [employer] at [phone] to confirm your monitor is transmitting.  Returning your monitor  Remove your patch and place it inside the Gateway. In the lower half of the Gateway there is a white  bag with prepaid postage on it. Place Gateway in bag and seal. Mail package back to [place] as soon as  possible. Your physician should have your final report approximately 7 days after you have mailed back  your monitor. Call [facility] Customer Care at [phone] if you have questions regarding your ZIO AT  patch monitor. Call them immediately if you see an orange light blinking on your monitor.  If your monitor falls off in less than 4 days, contact our Monitor department at [phone]. If your  monitor becomes loose or falls off after 4 days call [employer] at [phone] for suggestions on  securing your monitor   Your physician recommends you have  Cardiac PET Stress test.  See instructions below:   How to Prepare for Your Cardiac PET/CT Stress Test:  1. Please do not take these medications before your test:   Medications that may interfere with the cardiac pharmacological stress agent (ex. nitrates - including erectile dysfunction medications, isosorbide mononitrate, tamulosin or beta-blockers) the day of the exam. (Erectile dysfunction medication should be held for at least 72 hrs prior to test) Theophylline containing medications for 12 hours. Dipyridamole 48 hours prior to the test. Your remaining medications may be taken with water.  2. Nothing to eat or drink, except water, 3 hours prior to arrival time.   NO  caffeine/decaffeinated products, or chocolate 12 hours prior to arrival.  3. NO perfume, cologne or lotion on chest or abdomen area.          - FEMALES - Please avoid wearing dresses to this appointment.  4. Total time is 1 to 2 hours; you may want to bring reading material for the waiting time.  5. Please report to Radiology at the [facility] Main Entrance 30 minutes early for your test.  [address] [place], [place] [address]  6. Please report to Radiology at [facility] Main Entrance, medical mall, 30 mins prior to your test.  [address]  [place], [place]  [phone]  Diabetic Preparation:  Hold oral medications. You may take NPH and Lantus insulin. Do not take Humalog or Humulin R (Regular Insulin) the day of your test. Check blood sugars prior to leaving the house. If able to eat breakfast prior to 3 hour fasting, you may take all medications, including your insulin, Do not  worry if you miss your breakfast dose of insulin - start at your next meal. Patients who wear a continuous glucose monitor MUST remove the device prior to scanning.  IF YOU THINK YOU MAY BE PREGNANT, OR ARE NURSING PLEASE INFORM THE TECHNOLOGIST.  In preparation for your appointment, medication and supplies will be purchased.  Appointment availability is limited, so if you need to cancel or reschedule, please call the Radiology Department at [phone] [facility]) OR [phone] [facility])  24 hours in advance to avoid a cancellation fee of $100.00  What to Expect After you Arrive:  Once you arrive and check in for your appointment, you will be taken to a preparation room within the Radiology Department.  A technologist or Nurse will obtain your medical history, verify that you are correctly prepped for the exam, and explain the procedure.  Afterwards,  an IV will be started in your arm and electrodes will be placed on your skin for EKG monitoring during the  stress portion of the exam. Then you will be escorted to the PET/CT scanner.  There, staff will get you positioned on the scanner and obtain a blood pressure and EKG.  During the exam, you will continue to be connected to the EKG and blood pressure machines.  A small, safe amount of a radioactive tracer will be injected in your IV to obtain a series of pictures of your heart along with an injection of a stress agent.    After your Exam:  It is recommended that you eat a meal and drink a caffeinated beverage to counter act any effects of the stress agent.  Drink plenty of fluids for the remainder of the day and urinate frequently for the first couple of hours after the exam.  Your doctor will inform you of your test results within 7-10 business days.  For more information and frequently asked questions, please visit our website : [email or web address]  For questions about your test or how to prepare for your test, please call: Cardiac Imaging Nurse Navigators Office: [phone]    Follow-Up: At [facility], you and your health needs are our priority.  As part of our continuing mission to provide you with exceptional heart care, we have created designated Provider Care Teams.  These Care Teams include your primary Cardiologist (physician) and Advanced Practice Providers (APPs -  Physician Assistants and Nurse Practitioners) who all work together to provide you with the care you need, when you need it.  We recommend signing up for the patient portal called "MyChart".  Sign up information is provided on this After Visit Summary.  MyChart is used to connect with patients for Virtual Visits (Telemedicine).  Patients are able to view lab/test results, encounter notes, upcoming appointments, etc.  Non-urgent messages can be sent to your provider as well.   To learn more about what you can do with MyChart, go to [email or web address].    Your next appointment:   6  week(s)  Provider:   Yates Decamp, MD     Other Instructions

## 2023-09-26 NOTE — Progress Notes (Unsigned)
Enrolled for [employer] to mail a ZIO XT long term holter monitor to the patients address on file.   Dr. Jacinto Halim to read.

## 2023-09-26 NOTE — Progress Notes (Signed)
EKG:  EKG is ordered today, personally reviewed, demonstrating  EKG Interpretation Date/Time:  Thursday September 26 2023 08:34:25 EDT Ventricular Rate:  82 PR Interval:  174 QRS Duration:  74 QT Interval:  362 QTC Calculation: 422 R Axis:   10  Text  Interpretation: Normal sinus rhythm Normal ECG When compared with ECG of 21-Jul-2015 13:43, No significant change was found Confirmed by Reather Littler [phone]) on 09/26/2023 8:58:51 AM   CV Studies:  Cardiac Studies & Procedures       ECHOCARDIOGRAM  ECHOCARDIOGRAM COMPLETE 07/24/2022  Narrative ECHOCARDIOGRAM REPORT    Patient Name:   Shannon Bowman Date of Exam: 07/24/2022 Medical Rec #:  [id number]        Height:       62.0 in Accession #:    [id number]       Weight:       125.0 lb Date of Birth:  January 26, 1932        BSA:          1.566 m Patient Age:    89 years         BP:           130/80 mmHg Patient Gender: F                HR:           71 bpm. Exam Location:  [address]  Procedure: 2D Echo, Cardiac Doppler, Color Doppler and Intracardiac Opacification Agent  Indications:    G45.9 TIA  History:        Patient has no prior history of Echocardiogram examinations. TIA; Risk Factors:Dyslipidemia.  Sonographer:    Samule Ohm RDCS Referring Phys: [id number] Charlane Ferretti   Sonographer Comments: Technically difficult study due to poor echo windows. IMPRESSIONS   1. Left ventricular ejection fraction, by estimation, is 60 to 65%. The left ventricle has normal function. The left ventricle has no regional wall motion abnormalities. There is severe left ventricular hypertrophy. Left ventricular diastolic parameters are consistent with Grade I diastolic dysfunction (impaired relaxation). 2. Right ventricular systolic function is normal. The right ventricular size is normal. 3. The mitral valve is abnormal. Mild mitral valve regurgitation. No evidence of mitral stenosis. Moderate mitral annular calcification. 4. The aortic valve is tricuspid. There is moderate calcification of the aortic valve. Aortic valve regurgitation is mild. Aortic valve sclerosis/calcification is present, without any evidence of aortic stenosis. 5. The inferior vena cava is normal in size with greater than  50% respiratory variability, suggesting right atrial pressure of 3 mmHg.  FINDINGS Left Ventricle: Left ventricular ejection fraction, by estimation, is 60 to 65%. The left ventricle has normal function. The left ventricle has no regional wall motion abnormalities. Definity contrast agent was given IV to delineate the left ventricular endocardial borders. The left ventricular internal cavity size was normal in size. There is severe left ventricular hypertrophy. Left ventricular diastolic parameters are consistent with Grade I diastolic dysfunction (impaired relaxation).  Right Ventricle: The right ventricular size is normal. No increase in right ventricular wall thickness. Right ventricular systolic function is normal.  Left Atrium: Left atrial size was normal in size.  Right Atrium: Right atrial size was normal in size.  Pericardium: There is no evidence of pericardial effusion.  Mitral Valve: The mitral valve is abnormal. Moderate mitral annular calcification. Mild mitral valve regurgitation. No evidence of mitral valve stenosis.  Tricuspid Valve: The tricuspid valve is normal in structure. Tricuspid valve regurgitation is not demonstrated. No  Cardiology Office Note    Date:  09/26/2023  ID:  Aoi, Kouns 08-07-1932, MRN [id number] PCP:  Charlane Ferretti, DO  Cardiologist:  Yates Decamp, MD  Electrophysiologist:  None   Chief Complaint: Follow up for dyspnea   History of Present Illness: .    Shannon Bowman is a 89 y.o. female with visit-pertinent history of hypertension, hyperlipidemia, severe LVH, aortic atherosclerosis, CKD stage III, former tobacco use and TIA in August 2023.   Following her TIA in August 2023 she had persistent dizziness, most with exertion in addition to shortness of breath.  Her echocardiogram indicated LVEF of 60 to 65%, no RWMA, severe LVH, G1 DD.  Mild mitral valve regurgitation, moderate mitral annular calcification, mild AI, aortic valve sclerosis without evidence of stenosis.  Her carotid artery duplex in 08/2022 indicated 1 to 15% bilateral ICA stenosis.  On 09/18/2022 she had an exercise nuclear stress test that was low risk, stress LVEF 70%, normal ECG stress.  She was last seen in office by Dr. Jacinto Halim on 07/08/2023, she presented with marked worsening dyspnea even doing minimal activity and severe lifestyle limiting dizziness.  On evaluation she had supine hypertension and orthostatic hypotension with a 50 mmHg drop in blood pressure.  He continued her low-dose losartan in the morning and amlodipine 5 mg at night as her standing blood pressure was normal. CT angio chest in 10/2022 showed no evidence of pulmonary embolus or acute airspace opacity, she did have aortic atherosclerosis. PFTs in 12/2022 were normal, graduated from pulmonary rehab in 01/2023.   On 09/13/2023 patient notified the office regarding low blood pressures and pain starting in the ears that radiates to her jaw and down her chest and across her back.  She reported 1 blood pressure while she was out doing yard work and felt dizzy and shaky her blood pressure was 88/53 heart rate 95.  Dr. Jacinto Halim recommended that she discontinue  amlodipine 5 mg daily and start metoprolol succinate 25 mg in the evening.  Patient had already stopped taking losartan-HCTZ.  On 09/25/2023 PA from [facility] notified the office that patient was having positive with static blood pressures, chronic shortness of breath on exertion but with new symptoms of dizziness, lightheadedness and chest pressure.  She reported she had not started on her metoprolol.  Today she presents for follow up, she reports she is doing "ok". She notes concern regarding her symptoms that have been ongoing for a few months. She reports dyspnea with little exertion as well as chest and back discomfort that she has associated with exertion, resolves with rest. Unfortunately she is unable to provide further details aside from her discomfort is not pain. She also reports an ongoing "swimmy headed" or off balanced feeling, this can occur with exertion or at rest, denies presyncope or syncope. Today she does report new palpitations and feelings of irregular heart rate, unable to state how long episodes are lasting or when they specifically occur. She reports she stopped her losartan-hydrochlorothiazide a few weeks ago, she has continued her amlodipine and did not start her metoprolol out of concern for side effects. Orthostatics are negative today.  Labwork independently reviewed: 05/02/23: creatinine 0.9, sodium 135, potassium 4.3, AST 25, ALT 20, HGB 12.4, HCT 36, Cholesterol 139, triglycerides 49, LDL 84, HDL 45   ROS: .   Today she denies lower extremity edema, melena, hematuria, hemoptysis, diaphoresis, weakness, presyncope, syncope, orthopnea, and PND.  All other systems are reviewed and otherwise negative. Studies Reviewed: .  EKG:  EKG is ordered today, personally reviewed, demonstrating  EKG Interpretation Date/Time:  Thursday September 26 2023 08:34:25 EDT Ventricular Rate:  82 PR Interval:  174 QRS Duration:  74 QT Interval:  362 QTC Calculation: 422 R Axis:   10  Text  Interpretation: Normal sinus rhythm Normal ECG When compared with ECG of 21-Jul-2015 13:43, No significant change was found Confirmed by Reather Littler [phone]) on 09/26/2023 8:58:51 AM   CV Studies:  Cardiac Studies & Procedures       ECHOCARDIOGRAM  ECHOCARDIOGRAM COMPLETE 07/24/2022  Narrative ECHOCARDIOGRAM REPORT    Patient Name:   Shannon Bowman Date of Exam: 07/24/2022 Medical Rec #:  [id number]        Height:       62.0 in Accession #:    [id number]       Weight:       125.0 lb Date of Birth:  January 26, 1932        BSA:          1.566 m Patient Age:    89 years         BP:           130/80 mmHg Patient Gender: F                HR:           71 bpm. Exam Location:  [address]  Procedure: 2D Echo, Cardiac Doppler, Color Doppler and Intracardiac Opacification Agent  Indications:    G45.9 TIA  History:        Patient has no prior history of Echocardiogram examinations. TIA; Risk Factors:Dyslipidemia.  Sonographer:    Samule Ohm RDCS Referring Phys: [id number] Charlane Ferretti   Sonographer Comments: Technically difficult study due to poor echo windows. IMPRESSIONS   1. Left ventricular ejection fraction, by estimation, is 60 to 65%. The left ventricle has normal function. The left ventricle has no regional wall motion abnormalities. There is severe left ventricular hypertrophy. Left ventricular diastolic parameters are consistent with Grade I diastolic dysfunction (impaired relaxation). 2. Right ventricular systolic function is normal. The right ventricular size is normal. 3. The mitral valve is abnormal. Mild mitral valve regurgitation. No evidence of mitral stenosis. Moderate mitral annular calcification. 4. The aortic valve is tricuspid. There is moderate calcification of the aortic valve. Aortic valve regurgitation is mild. Aortic valve sclerosis/calcification is present, without any evidence of aortic stenosis. 5. The inferior vena cava is normal in size with greater than  50% respiratory variability, suggesting right atrial pressure of 3 mmHg.  FINDINGS Left Ventricle: Left ventricular ejection fraction, by estimation, is 60 to 65%. The left ventricle has normal function. The left ventricle has no regional wall motion abnormalities. Definity contrast agent was given IV to delineate the left ventricular endocardial borders. The left ventricular internal cavity size was normal in size. There is severe left ventricular hypertrophy. Left ventricular diastolic parameters are consistent with Grade I diastolic dysfunction (impaired relaxation).  Right Ventricle: The right ventricular size is normal. No increase in right ventricular wall thickness. Right ventricular systolic function is normal.  Left Atrium: Left atrial size was normal in size.  Right Atrium: Right atrial size was normal in size.  Pericardium: There is no evidence of pericardial effusion.  Mitral Valve: The mitral valve is abnormal. Moderate mitral annular calcification. Mild mitral valve regurgitation. No evidence of mitral valve stenosis.  Tricuspid Valve: The tricuspid valve is normal in structure. Tricuspid valve regurgitation is not demonstrated. No  EKG:  EKG is ordered today, personally reviewed, demonstrating  EKG Interpretation Date/Time:  Thursday September 26 2023 08:34:25 EDT Ventricular Rate:  82 PR Interval:  174 QRS Duration:  74 QT Interval:  362 QTC Calculation: 422 R Axis:   10  Text  Interpretation: Normal sinus rhythm Normal ECG When compared with ECG of 21-Jul-2015 13:43, No significant change was found Confirmed by Reather Littler [phone]) on 09/26/2023 8:58:51 AM   CV Studies:  Cardiac Studies & Procedures       ECHOCARDIOGRAM  ECHOCARDIOGRAM COMPLETE 07/24/2022  Narrative ECHOCARDIOGRAM REPORT    Patient Name:   Shannon Bowman Date of Exam: 07/24/2022 Medical Rec #:  [id number]        Height:       62.0 in Accession #:    [id number]       Weight:       125.0 lb Date of Birth:  January 26, 1932        BSA:          1.566 m Patient Age:    89 years         BP:           130/80 mmHg Patient Gender: F                HR:           71 bpm. Exam Location:  [address]  Procedure: 2D Echo, Cardiac Doppler, Color Doppler and Intracardiac Opacification Agent  Indications:    G45.9 TIA  History:        Patient has no prior history of Echocardiogram examinations. TIA; Risk Factors:Dyslipidemia.  Sonographer:    Samule Ohm RDCS Referring Phys: [id number] Charlane Ferretti   Sonographer Comments: Technically difficult study due to poor echo windows. IMPRESSIONS   1. Left ventricular ejection fraction, by estimation, is 60 to 65%. The left ventricle has normal function. The left ventricle has no regional wall motion abnormalities. There is severe left ventricular hypertrophy. Left ventricular diastolic parameters are consistent with Grade I diastolic dysfunction (impaired relaxation). 2. Right ventricular systolic function is normal. The right ventricular size is normal. 3. The mitral valve is abnormal. Mild mitral valve regurgitation. No evidence of mitral stenosis. Moderate mitral annular calcification. 4. The aortic valve is tricuspid. There is moderate calcification of the aortic valve. Aortic valve regurgitation is mild. Aortic valve sclerosis/calcification is present, without any evidence of aortic stenosis. 5. The inferior vena cava is normal in size with greater than  50% respiratory variability, suggesting right atrial pressure of 3 mmHg.  FINDINGS Left Ventricle: Left ventricular ejection fraction, by estimation, is 60 to 65%. The left ventricle has normal function. The left ventricle has no regional wall motion abnormalities. Definity contrast agent was given IV to delineate the left ventricular endocardial borders. The left ventricular internal cavity size was normal in size. There is severe left ventricular hypertrophy. Left ventricular diastolic parameters are consistent with Grade I diastolic dysfunction (impaired relaxation).  Right Ventricle: The right ventricular size is normal. No increase in right ventricular wall thickness. Right ventricular systolic function is normal.  Left Atrium: Left atrial size was normal in size.  Right Atrium: Right atrial size was normal in size.  Pericardium: There is no evidence of pericardial effusion.  Mitral Valve: The mitral valve is abnormal. Moderate mitral annular calcification. Mild mitral valve regurgitation. No evidence of mitral valve stenosis.  Tricuspid Valve: The tricuspid valve is normal in structure. Tricuspid valve regurgitation is not demonstrated. No  Cardiology Office Note    Date:  09/26/2023  ID:  Aoi, Kouns 08-07-1932, MRN [id number] PCP:  Charlane Ferretti, DO  Cardiologist:  Yates Decamp, MD  Electrophysiologist:  None   Chief Complaint: Follow up for dyspnea   History of Present Illness: .    Shannon Bowman is a 89 y.o. female with visit-pertinent history of hypertension, hyperlipidemia, severe LVH, aortic atherosclerosis, CKD stage III, former tobacco use and TIA in August 2023.   Following her TIA in August 2023 she had persistent dizziness, most with exertion in addition to shortness of breath.  Her echocardiogram indicated LVEF of 60 to 65%, no RWMA, severe LVH, G1 DD.  Mild mitral valve regurgitation, moderate mitral annular calcification, mild AI, aortic valve sclerosis without evidence of stenosis.  Her carotid artery duplex in 08/2022 indicated 1 to 15% bilateral ICA stenosis.  On 09/18/2022 she had an exercise nuclear stress test that was low risk, stress LVEF 70%, normal ECG stress.  She was last seen in office by Dr. Jacinto Halim on 07/08/2023, she presented with marked worsening dyspnea even doing minimal activity and severe lifestyle limiting dizziness.  On evaluation she had supine hypertension and orthostatic hypotension with a 50 mmHg drop in blood pressure.  He continued her low-dose losartan in the morning and amlodipine 5 mg at night as her standing blood pressure was normal. CT angio chest in 10/2022 showed no evidence of pulmonary embolus or acute airspace opacity, she did have aortic atherosclerosis. PFTs in 12/2022 were normal, graduated from pulmonary rehab in 01/2023.   On 09/13/2023 patient notified the office regarding low blood pressures and pain starting in the ears that radiates to her jaw and down her chest and across her back.  She reported 1 blood pressure while she was out doing yard work and felt dizzy and shaky her blood pressure was 88/53 heart rate 95.  Dr. Jacinto Halim recommended that she discontinue  amlodipine 5 mg daily and start metoprolol succinate 25 mg in the evening.  Patient had already stopped taking losartan-HCTZ.  On 09/25/2023 PA from [facility] notified the office that patient was having positive with static blood pressures, chronic shortness of breath on exertion but with new symptoms of dizziness, lightheadedness and chest pressure.  She reported she had not started on her metoprolol.  Today she presents for follow up, she reports she is doing "ok". She notes concern regarding her symptoms that have been ongoing for a few months. She reports dyspnea with little exertion as well as chest and back discomfort that she has associated with exertion, resolves with rest. Unfortunately she is unable to provide further details aside from her discomfort is not pain. She also reports an ongoing "swimmy headed" or off balanced feeling, this can occur with exertion or at rest, denies presyncope or syncope. Today she does report new palpitations and feelings of irregular heart rate, unable to state how long episodes are lasting or when they specifically occur. She reports she stopped her losartan-hydrochlorothiazide a few weeks ago, she has continued her amlodipine and did not start her metoprolol out of concern for side effects. Orthostatics are negative today.  Labwork independently reviewed: 05/02/23: creatinine 0.9, sodium 135, potassium 4.3, AST 25, ALT 20, HGB 12.4, HCT 36, Cholesterol 139, triglycerides 49, LDL 84, HDL 45   ROS: .   Today she denies lower extremity edema, melena, hematuria, hemoptysis, diaphoresis, weakness, presyncope, syncope, orthopnea, and PND.  All other systems are reviewed and otherwise negative. Studies Reviewed: .

## 2023-09-27 LAB — CBC
Hematocrit: 46.4 % (ref 34.0–46.6)
Hemoglobin: 15.3 g/dL (ref 11.1–15.9)
MCH: 31.5 pg (ref 26.6–33.0)
MCHC: 33 g/dL (ref 31.5–35.7)
MCV: 96 fL (ref 79–97)
Platelets: 228 10*3/uL (ref 150–450)
RBC: 4.85 x10E6/uL (ref 3.77–5.28)
RDW: 12.9 % (ref 11.7–15.4)
WBC: 7.7 10*3/uL (ref 3.4–10.8)

## 2023-09-27 LAB — MAGNESIUM: Magnesium: 2.1 mg/dL (ref 1.6–2.3)

## 2023-09-27 LAB — BASIC METABOLIC PANEL
BUN/Creatinine Ratio: 14 (ref 12–28)
BUN: 14 mg/dL (ref 8–27)
CO2: 23 mmol/L (ref 20–29)
Calcium: 9 mg/dL (ref 8.7–10.3)
Chloride: 103 mmol/L (ref 96–106)
Creatinine, Ser: 0.97 mg/dL (ref 0.57–1.00)
Glucose: 134 mg/dL — ABNORMAL HIGH (ref 70–99)
Potassium: 4 mmol/L (ref 3.5–5.2)
Sodium: 139 mmol/L (ref 134–144)
eGFR: 56 mL/min/{1.73_m2} — ABNORMAL LOW (ref >59)

## 2023-09-27 LAB — TSH: TSH: 1.72 u[IU]/mL (ref 0.450–4.500)

## 2023-09-29 DIAGNOSIS — G459 Transient cerebral ischemic attack, unspecified: Secondary | ICD-10-CM

## 2023-09-29 DIAGNOSIS — R0609 Other forms of dyspnea: Secondary | ICD-10-CM

## 2023-09-29 DIAGNOSIS — R42 Dizziness and giddiness: Secondary | ICD-10-CM | POA: Diagnosis not present

## 2023-09-29 DIAGNOSIS — R002 Palpitations: Secondary | ICD-10-CM | POA: Diagnosis not present

## 2023-09-29 DIAGNOSIS — I499 Cardiac arrhythmia, unspecified: Secondary | ICD-10-CM

## 2023-09-29 DIAGNOSIS — R072 Precordial pain: Secondary | ICD-10-CM

## 2023-10-07 ENCOUNTER — Ambulatory Visit: Payer: Medicare Other | Admitting: Cardiology

## 2023-10-18 ENCOUNTER — Encounter (HOSPITAL_COMMUNITY): Payer: Self-pay

## 2023-10-21 ENCOUNTER — Telehealth (HOSPITAL_COMMUNITY): Payer: Self-pay | Admitting: *Deleted

## 2023-10-21 NOTE — Telephone Encounter (Signed)
Reaching out to patient to offer assistance regarding upcoming cardiac imaging study; pt verbalizes understanding of appt date/time, parking situation and where to check in, pre-test NPO status  and verified current allergies; name and call back number provided for further questions should they arise  Shaneeka Scarboro RN Navigator Cardiac Imaging [facility] [phone] office [phone] cell  Patient aware to avoid caffeine 12 hours prior to her cardiac PET scan. 

## 2023-10-22 ENCOUNTER — Telehealth: Payer: Self-pay

## 2023-10-22 MED ORDER — METOPROLOL SUCCINATE ER 25 MG PO TB24: 12.5000 mg | ORAL_TABLET | Freq: Every day | ORAL | 3 refills | Status: AC

## 2023-10-22 NOTE — Telephone Encounter (Signed)
Left Message to call back.

## 2023-10-22 NOTE — Telephone Encounter (Signed)
 Patient called back advised of below they verbalized understanding

## 2023-10-22 NOTE — Telephone Encounter (Signed)
-----   Message from Rip Harbour sent at 10/22/2023 10:50 AM EST ----- Please let Shannon Bowman know that her cardiac monitor showed that her predominant rhythm was sinus rhythm. She had frequent short episodes of fast heart beats that originate from the top chambers of the heart. Would recommend starting metoprolol succinate 12.5 mg in the evening. Continue to monitor symptoms, proceed with additional testing as planned.

## 2023-10-23 ENCOUNTER — Encounter (HOSPITAL_COMMUNITY)
Admission: RE | Admit: 2023-10-23 | Discharge: 2023-10-23 | Disposition: A | Discharge status: Home / Self Care | Payer: Medicare Other | Source: Ambulatory Visit | Attending: Cardiology | Admitting: Cardiology

## 2023-10-23 DIAGNOSIS — R002 Palpitations: Secondary | ICD-10-CM | POA: Diagnosis present

## 2023-10-23 DIAGNOSIS — I499 Cardiac arrhythmia, unspecified: Secondary | ICD-10-CM | POA: Diagnosis present

## 2023-10-23 DIAGNOSIS — I10 Essential (primary) hypertension: Secondary | ICD-10-CM | POA: Insufficient documentation

## 2023-10-23 DIAGNOSIS — R42 Dizziness and giddiness: Secondary | ICD-10-CM | POA: Insufficient documentation

## 2023-10-23 DIAGNOSIS — R0609 Other forms of dyspnea: Secondary | ICD-10-CM | POA: Insufficient documentation

## 2023-10-23 DIAGNOSIS — R072 Precordial pain: Secondary | ICD-10-CM | POA: Diagnosis present

## 2023-10-23 DIAGNOSIS — I951 Orthostatic hypotension: Secondary | ICD-10-CM | POA: Diagnosis present

## 2023-10-23 DIAGNOSIS — G459 Transient cerebral ischemic attack, unspecified: Secondary | ICD-10-CM | POA: Insufficient documentation

## 2023-10-23 MED ORDER — RUBIDIUM RB82 GENERATOR (RUBYFILL)
14.5000 | PACK | Freq: Once | INTRAVENOUS | Status: AC
  Administered 2023-10-23: 14.5 via INTRAVENOUS

## 2023-10-23 MED ORDER — RUBIDIUM RB82 GENERATOR (RUBYFILL)
14.9000 | PACK | Freq: Once | INTRAVENOUS | Status: AC
  Administered 2023-10-23: 14.9 via INTRAVENOUS

## 2023-10-23 MED ORDER — REGADENOSON 0.4 MG/5ML IV SOLN
INTRAVENOUS | Status: AC
  Filled 2023-10-23: qty 5

## 2023-10-23 MED ORDER — REGADENOSON 0.4 MG/5ML IV SOLN
0.4000 mg | Freq: Once | INTRAVENOUS | Status: AC
  Administered 2023-10-23: 0.4 mg via INTRAVENOUS

## 2023-10-24 ENCOUNTER — Ambulatory Visit (HOSPITAL_COMMUNITY): Payer: Medicare Other | Attending: Cardiology

## 2023-10-24 DIAGNOSIS — R0609 Other forms of dyspnea: Secondary | ICD-10-CM | POA: Insufficient documentation

## 2023-10-24 DIAGNOSIS — I10 Essential (primary) hypertension: Secondary | ICD-10-CM | POA: Diagnosis present

## 2023-10-24 DIAGNOSIS — I951 Orthostatic hypotension: Secondary | ICD-10-CM | POA: Insufficient documentation

## 2023-10-24 DIAGNOSIS — R002 Palpitations: Secondary | ICD-10-CM | POA: Diagnosis present

## 2023-10-24 DIAGNOSIS — R42 Dizziness and giddiness: Secondary | ICD-10-CM | POA: Insufficient documentation

## 2023-10-24 DIAGNOSIS — R072 Precordial pain: Secondary | ICD-10-CM | POA: Diagnosis present

## 2023-10-24 DIAGNOSIS — G459 Transient cerebral ischemic attack, unspecified: Secondary | ICD-10-CM | POA: Diagnosis present

## 2023-10-24 DIAGNOSIS — I499 Cardiac arrhythmia, unspecified: Secondary | ICD-10-CM | POA: Insufficient documentation

## 2023-10-24 LAB — NM PET CT CARDIAC PERFUSION MULTI W/ABSOLUTE BLOODFLOW
LV dias vol: 82 mL (ref 46–106)
LV sys vol: 35 mL
MBFR: 1.33
Nuc Rest EF: 63 %
Nuc Stress EF: 57 %
Peak HR: 78 {beats}/min
Rest HR: 67 {beats}/min
Rest MBF: 0.94 ml/g/min
Rest Nuclear Isotope Dose: 14.5 mCi
ST Depression (mm): 1 mm
Stress MBF: 1.25 ml/g/min
Stress Nuclear Isotope Dose: 14.9 mCi
TID: 1.16

## 2023-10-24 LAB — ECHOCARDIOGRAM COMPLETE
AR max vel: 4.12 cm2
AV Area VTI: 4.22 cm2
AV Area mean vel: 4.1 cm2
AV Mean grad: 4 mm[Hg]
AV Peak grad: 7.2 mm[Hg]
Ao pk vel: 1.34 m/s
Area-P 1/2: 2.74 cm2
P 1/2 time: 589 ms
S' Lateral: 2.7 cm

## 2023-11-06 ENCOUNTER — Ambulatory Visit: Payer: Medicare Other | Attending: Cardiology | Admitting: Cardiology

## 2023-11-06 ENCOUNTER — Encounter: Payer: Self-pay | Admitting: Cardiology

## 2023-11-06 VITALS — BP 130/80 | HR 68 | Ht 61.0 in | Wt 127.8 lb

## 2023-11-06 DIAGNOSIS — E785 Hyperlipidemia, unspecified: Secondary | ICD-10-CM

## 2023-11-06 DIAGNOSIS — R9439 Abnormal result of other cardiovascular function study: Secondary | ICD-10-CM | POA: Diagnosis not present

## 2023-11-06 DIAGNOSIS — R0609 Other forms of dyspnea: Secondary | ICD-10-CM | POA: Diagnosis not present

## 2023-11-06 DIAGNOSIS — I209 Angina pectoris, unspecified: Secondary | ICD-10-CM

## 2023-11-06 DIAGNOSIS — R002 Palpitations: Secondary | ICD-10-CM

## 2023-11-06 DIAGNOSIS — I351 Nonrheumatic aortic (valve) insufficiency: Secondary | ICD-10-CM | POA: Diagnosis not present

## 2023-11-06 LAB — LIPID PANEL
Chol/HDL Ratio: 2.3 {ratio} (ref 0.0–4.4)
Cholesterol, Total: 133 mg/dL (ref 100–199)
HDL: 57 mg/dL (ref >39)
LDL Chol Calc (NIH): 61 mg/dL (ref 0–99)
Triglycerides: 77 mg/dL (ref 0–149)
VLDL Cholesterol Cal: 15 mg/dL (ref 5–40)

## 2023-11-06 LAB — LDL CHOLESTEROL, DIRECT: LDL Direct: 62 mg/dL (ref 0–99)

## 2023-11-06 MED ORDER — NITROGLYCERIN 0.4 MG SL SUBL: 0.4000 mg | SUBLINGUAL_TABLET | SUBLINGUAL | 2 refills | Status: AC | PRN

## 2023-11-06 NOTE — Patient Instructions (Signed)
Medication Instructions:  Your physician recommends that you continue on your current medications as directed. Please refer to the Current Medication list given to you today.  *If you need a refill on your cardiac medications before your next appointment, please call your pharmacy*   Lab Work: Lab work to be done today--Lipids and direct LDL If you have labs (blood work) drawn today and your tests are completely normal, you will receive your results only by: MyChart Message (if you have MyChart) OR A paper copy in the mail If you have any lab test that is abnormal or we need to change your treatment, we will call you to review the results.   Testing/Procedures: none   Follow-Up: At [facility], you and your health needs are our priority.  As part of our continuing mission to provide you with exceptional heart care, we have created designated Provider Care Teams.  These Care Teams include your primary Cardiologist (physician) and Advanced Practice Providers (APPs -  Physician Assistants and Nurse Practitioners) who all work together to provide you with the care you need, when you need it.  We recommend signing up for the patient portal called "MyChart".  Sign up information is provided on this After Visit Summary.  MyChart is used to connect with patients for Virtual Visits (Telemedicine).  Patients are able to view lab/test results, encounter notes, upcoming appointments, etc.  Non-urgent messages can be sent to your provider as well.   To learn more about what you can do with MyChart, go to [email or web address].    Your next appointment:   6 month(s)  Provider:   Yates Decamp, MD     Other Instructions

## 2023-11-06 NOTE — Progress Notes (Unsigned)
Cardiology Office Note:  .   Date:  11/07/2023  ID:  Shannon Bowman, DOB June 16, 1932, MRN [id number] PCP: Charlane Ferretti, DO  [facility] Providers Cardiologist:  Yates Decamp, MD   History of Present Illness: .   Shannon Bowman is a 89 y.o.  hyperlipidemia, severe LVH, aortic atherosclerosis, CKD stage III, former tobacco use and TIA in August 2023.  She also has supine hypertension and orthostatic hypotension.  She presented to our office on 09/26/2023 with chest pain and worsening dyspnea, after discussions and in view of advanced age, patient preference, underwent nuclear stress test and also echocardiogram and now presents for follow-up.  Discussed the use of AI scribe software for clinical note transcription with the patient, who gave verbal consent to proceed.  History of Present Illness   The patient, with a history of hypertension and hyperlipidemia, presents for follow-up after a recent visit with a nurse practitioner for shortness of breath and chest discomfort. Since the last visit, she has been started on metoprolol 12.5mg , which has improved her symptoms but has caused side effects of  mild fatigue, and mild headaches. She denies any current chest discomfort or shortness of breath, attributing her improvement to the recent medication changes.  She also states that symptoms of palpitations have resolved.  The patient also reports a history of intermittent ear pain that radiates to the jaw and chest, which she has experienced for several years. These episodes are infrequent, occurring approximately once or twice a year, and are associated with a sensation of tightness in the chest.      Review of Systems  Cardiovascular:  Positive for dyspnea on exertion. Negative for chest pain and leg swelling.  Neurological:  Positive for dizziness.    Labs   Lab Results  Component Value Date   CHOL 133 11/06/2023   HDL 57 11/06/2023   LDLCALC 61 11/06/2023   LDLDIRECT 62  11/06/2023   TRIG 77 11/06/2023   CHOLHDL 2.3 11/06/2023   Lab Results  Component Value Date   NA 139 09/26/2023   K 4.0 09/26/2023   CO2 23 09/26/2023   GLUCOSE 134 (H) 09/26/2023   BUN 14 09/26/2023   CREATININE 0.97 09/26/2023   CALCIUM 9.0 09/26/2023   EGFR 56 (L) 09/26/2023   GFRNONAA 37 (L) 07/21/2015      Latest Ref Rng & Units 09/26/2023   10:25 AM 07/20/2022    5:13 PM 07/21/2015    1:35 PM  BMP  Glucose 70 - 99 mg/dL 295   284   BUN 8 - 27 mg/dL 14   21   Creatinine 1.32 - 1.00 mg/dL 4.40  1.02  7.25   BUN/Creat Ratio 12 - 28 14     Sodium 134 - 144 mmol/L 139   140   Potassium 3.5 - 5.2 mmol/L 4.0   4.4   Chloride 96 - 106 mmol/L 103   105   CO2 20 - 29 mmol/L 23   29   Calcium 8.7 - 10.3 mg/dL 9.0   9.6        Latest Ref Rng & Units 09/26/2023   10:25 AM 07/21/2015    1:35 PM  CBC  WBC 3.4 - 10.8 x10E3/uL 7.7  5.6   Hemoglobin 11.1 - 15.9 g/dL 36.6  44.0   Hematocrit 34.0 - 46.6 % 46.4  38.3   Platelets 150 - 450 x10E3/uL 228  200     External Labs:  Labs 07/23/2022:  Hb 14.0/HCT 38.5, platelets 252, normal indicis.   Serum glucose: 11 mg, BUN 15, creatinine 0.98, EGFR 56,, sodium 134, potassium 5.0, LFTs normal.   Cholesterol, total 183.000 m 07/23/2022 HDL 50.000 mg 07/23/2022 LDL 118.000 m 07/23/2022 Triglycerides 74.000 mg 07/23/2022  Physical Exam:   VS:  BP 130/80   Pulse 68   Ht 5\' 1"  (1.549 m)   Wt 127 lb 12.8 oz (58 kg)   SpO2 100%   BMI 24.15 kg/m    Wt Readings from Last 3 Encounters:  11/06/23 127 lb 12.8 oz (58 kg)  09/26/23 123 lb (55.8 kg)  07/23/23 130 lb 9.6 oz (59.2 kg)     Physical Exam Neck:     Vascular: No carotid bruit or JVD.  Cardiovascular:     Rate and Rhythm: Normal rate and regular rhythm.     Pulses: Intact distal pulses.     Heart sounds: Normal heart sounds. No murmur heard.    No gallop.  Pulmonary:     Effort: Pulmonary effort is normal.     Breath sounds: Normal breath sounds.  Abdominal:      General: Bowel sounds are normal.     Palpations: Abdomen is soft.  Musculoskeletal:     Right lower leg: No edema.     Left lower leg: No edema.     Studies Reviewed: .    NM PET CT CARDIAC PERFUSION MULTI W/ABSOLUTE BLOODFLOW 10/23/2023   Narrative   Findings are consistent with ischemia. The study is high risk with large size LAD distribution ischemic defect, EF drop with stress, abnormal MBFR, LAD calcifications, and presence of TID 1.16. Recommend cardiac catheterization if clincially indicated.   LV perfusion is abnormal. There is evidence of ischemia. Defect 1: There is a large defect with severe reduction in uptake present in the apical to mid anterior, septal and apex location(s) that is reversible. There is abnormal wall motion in the defect area. Consistent with ischemia. The defect is consistent with abnormal perfusion in the LAD territory.   Rest left ventricular function is normal. Rest EF: 63%. Stress left ventricular function is normal. Stress EF: 57%. End diastolic cavity size is normal. End systolic cavity size is normal. Evidence of transient ischemic dilation (TID) noted.   Myocardial blood flow was computed to be 0.54ml/g/min at rest and 1.33ml/g/min at stress. Global myocardial blood flow reserve was 1.33 and was abnormal.   Coronary calcium was present on the attenuation correction CT images. Severe coronary calcifications were present. Coronary calcifications were present in the left anterior descending artery and right coronary artery distribution(s).  ECHOCARDIOGRAM COMPLETE 10/24/2023  1. Left ventricular ejection fraction, by estimation, is 60 to 65%. The left ventricle has normal function. The left ventricle has no regional wall motion abnormalities. There is moderate left ventricular hypertrophy of the basal-septal segment. Left ventricular diastolic parameters are consistent with Grade I diastolic dysfunction (impaired relaxation). 2. Right ventricular systolic  function is normal. The right ventricular size is normal. 3. Left atrial size was mildly dilated. 4. The mitral valve is normal in structure. Mild to moderate mitral valve regurgitation. No evidence of mitral stenosis. 5. The aortic valve is calcified. There is at least moderate aortic regurgitation. Consider TEE for further evaluation. 6. Tricuspid valve regurgitation is moderate. 7. The inferior vena cava is normal in size with greater than 50% respiratory variability, suggesting right atrial pressure of 3 mmHg.  Zio Patch Extended out patient EKG monitoring 14 days starting 10/07/2023:  Predominant Rhythm : Normal sinus rhythm. Min HR: 49 bpm. Max HR 131 bpm   Atrial arrhythmias: There are frequent (256) SVT episodes, brief atrial tachycardia, longest 9.6 seconds.  Rare PACs and atrial couplets.   Atrial fibrillation: None   Ventricular arrhythmias: There were 29 ventricular tachycardia episodes, EKG review reveals PACs with aberrancy.  There is no ventricular tachycardia.  There were rare PACs and ventricular couplets and triplets.  Rare ventricular bigeminy, longest 50 seconds.  PVC burden 1%.   Heart Block: None   Symptoms: 1 diary entry revealed PAC.  EKG:         EKG 08/17/2022: Normal sinus rhythm at rate of 74 bpm, normal axis, poor R progression, probably normal variant. T wave abnormality, cannot exclude inferior ischemia. PVCs (2).   Medications and allergies    Allergies  Allergen Reactions   Phenylephrine Other (See Comments)    Possible allergy to dilating drop phenylephrine 2.5% - USE Tropicamide 1% only to dilate Possible allergy to dilating drop phenylephrine 2.5% - USE Tropicamide 1% only to dilate    Penicillins Rash     Current Outpatient Medications:    acetaminophen (TYLENOL) 325 MG tablet, Take 650 mg by mouth every 6 (six) hours as needed for mild pain., Disp: , Rfl:    amLODipine (NORVASC) 5 MG tablet, Take 5 mg by mouth daily., Disp: , Rfl:     ascorbic acid (VITAMIN C) 500 MG tablet, Take 500 mg by mouth daily., Disp: , Rfl:    aspirin EC 81 MG tablet, Take 81 mg by mouth daily. Swallow whole., Disp: , Rfl:    betamethasone valerate (VALISONE) 0.1 % cream, Apply 1 Application topically daily., Disp: , Rfl:    calcium carbonate (OS-CAL) 600 MG TABS, Take 600 mg by mouth 2 (two) times daily with a meal., Disp: , Rfl:    cetirizine (ZYRTEC) 10 MG tablet, Take 10 mg by mouth daily as needed for allergies. , Disp: , Rfl:    Cholecalciferol (VITAMIN D3) 25 MCG (1000 UT) CAPS, Take by mouth., Disp: , Rfl:    ezetimibe (ZETIA) 10 MG tablet, Take 1 tablet (10 mg total) by mouth daily., Disp: , Rfl:    metoprolol succinate (TOPROL XL) 25 MG 24 hr tablet, Take 0.5 tablets (12.5 mg total) by mouth daily., Disp: 45 tablet, Rfl: 3   nitroGLYCERIN (NITROSTAT) 0.4 MG SL tablet, Place 1 tablet (0.4 mg total) under the tongue every 5 (five) minutes as needed for chest pain., Disp: 25 tablet, Rfl: 2   omeprazole (PRILOSEC) 40 MG capsule, Take 40 mg by mouth as needed., Disp: , Rfl:    Probiotic CAPS, Take 2 capsules by mouth daily., Disp: , Rfl:    Propylene Glycol (SYSTANE BALANCE OP), Apply 1 drop to eye at bedtime., Disp: , Rfl:    rosuvastatin (CRESTOR) 20 MG tablet, TAKE 1 TABLET BY MOUTH DAILY, Disp: 90 tablet, Rfl: 3   traMADol (ULTRAM) 50 MG tablet, TAKE 1/2 TO 1 TABLET BY MOUTH EVERY 6 TO 8 HOURS AS NEEDED FOR PAIN, Disp: , Rfl:    ASSESSMENT AND PLAN: .      ICD-10-CM   1. Angina pectoris (HCC)  I20.9 nitroGLYCERIN (NITROSTAT) 0.4 MG SL tablet    2. DOE (dyspnea on exertion)  R06.09     3. Moderate aortic regurgitation  I35.1     4. Abnormal nuclear stress test  R94.39     5. Palpitations  R00.2     6. Hyperlipidemia, unspecified  hyperlipidemia type  E78.5 Lipid Profile    Direct LDL    1. Angina pectoris (HCC) Patient has had several years of ongoing angina that has been very stable with pain in her jaw radiating to the neck and  then to her chest and upper back that lasted few minutes and relieved with rest.  Recent exertional angina has improved since being on metoprolol succinate 12.5 mg daily which she is tolerating with very mild increased fatigue but states that both dyspnea and chest pain symptoms are improved and she has in fact had no recurrence of anginal symptoms and she has resumed all her activities.  I again discussed with her regarding high risk study and also discussed regarding medical management versus proceeding with cardiac catheterization.  Patient states that she is 89 years of age and prefers to do medical therapy and if her symptoms of dyspnea and/or chest pain were to recur, then we will consider cardiac catheterization. Presently she is on 2 antianginal therapy including amlodipine and metoprolol.  S/L NTG was prescribed and explained how to and when to use it and to notify us if there is change in frequency of use.  - nitroGLYCERIN (NITROSTAT) 0.4 MG SL tablet; Place 1 tablet (0.4 mg total) under the tongue every 5 (five) minutes as needed for chest pain.  Dispense: 25 tablet; Refill: 2  2. DOE (dyspnea on exertion) No clinical evidence of heart failure.  Dyspnea on exertion is also improved on a beta-blocker therapy.  3. Moderate aortic regurgitation Echocardiogram reveals moderate aortic regurgitation.  Minimal progression compared to echocardiogram in 2023.  Do not think this is the etiology for angina.  4. Abnormal nuclear stress test As dictated above, abnormal nuclear stress test will do medical therapy.  5. Palpitations Symptoms of palpitations are essentially resolved, I reviewed the results of the extended EKG monitoring and reassured the patient.  No atrial fibrillation was evident.  Brief atrial tachycardia.  6. Hypercholesterolemia: I repeated lipid panel today, lipids are now very well-controlled on present statin dose, Crestor 20 mg along with ezetimibe 10 mg daily, continue the  same.  Will forward a copy to her PCP.  This was a 38-minute office visit encounter in discussions regarding medical management versus cardiac catheterization, review of medications and medication changes, review of labs and also investigations.  I will see her back in 6 months for follow-up.  Signed,  Yates Decamp, MD, [facility] 11/07/2023, 6:46 AM [facility] [address] [place], [place] [address] Phone: [phone]. Fax:  [phone]

## 2023-11-07 NOTE — Progress Notes (Signed)
Excellent cholesterol control and I have faxed copy to her PCP. Continue Zetia and crestor

## 2023-11-11 ENCOUNTER — Telehealth: Payer: Self-pay | Admitting: Cardiology

## 2023-11-11 NOTE — Telephone Encounter (Signed)
-----   Message from Yates Decamp sent at 11/07/2023  6:47 AM EST ----- Excellent cholesterol control and I have faxed copy to her PCP. Continue Zetia and crestor

## 2023-11-11 NOTE — Telephone Encounter (Signed)
I spoke with patient and reviewed lab results with her.  

## 2023-11-11 NOTE — Telephone Encounter (Signed)
Patient returning call in regards to lab results.  

## 2023-12-06 ENCOUNTER — Ambulatory Visit: Payer: Medicare Other | Admitting: Cardiology

## 2023-12-14 ENCOUNTER — Other Ambulatory Visit: Payer: Self-pay | Admitting: Internal Medicine

## 2024-01-11 ENCOUNTER — Other Ambulatory Visit: Payer: Self-pay | Admitting: Internal Medicine

## 2024-03-16 ENCOUNTER — Other Ambulatory Visit: Payer: Self-pay

## 2024-03-16 MED ORDER — ROSUVASTATIN CALCIUM 20 MG PO TABS: 20.0000 mg | ORAL_TABLET | Freq: Every day | ORAL | 2 refills | Status: DC

## 2024-03-16 MED ORDER — AMLODIPINE BESYLATE 5 MG PO TABS: 5.0000 mg | ORAL_TABLET | Freq: Every day | ORAL | 2 refills | Status: AC

## 2024-05-06 ENCOUNTER — Encounter: Payer: Self-pay | Admitting: Cardiology

## 2024-05-06 ENCOUNTER — Ambulatory Visit: Attending: Cardiology | Admitting: Cardiology

## 2024-05-06 VITALS — BP 142/66 | HR 71 | Ht 61.0 in | Wt 127.2 lb

## 2024-05-06 DIAGNOSIS — R55 Syncope and collapse: Secondary | ICD-10-CM

## 2024-05-06 DIAGNOSIS — I25118 Atherosclerotic heart disease of native coronary artery with other forms of angina pectoris: Secondary | ICD-10-CM

## 2024-05-06 DIAGNOSIS — I351 Nonrheumatic aortic (valve) insufficiency: Secondary | ICD-10-CM

## 2024-05-06 DIAGNOSIS — R252 Cramp and spasm: Secondary | ICD-10-CM

## 2024-05-06 DIAGNOSIS — I951 Orthostatic hypotension: Secondary | ICD-10-CM | POA: Diagnosis not present

## 2024-05-06 NOTE — Patient Instructions (Signed)
 Medication Instructions:  Your physician recommends that you continue on your current medications as directed. Please refer to the Current Medication list given to you today.  *If you need a refill on your cardiac medications before your next appointment, please call your pharmacy*  Follow-Up: At [facility], you and your health needs are our priority.  As part of our continuing mission to provide you with exceptional heart care, our providers are all part of one team.  This team includes your primary Cardiologist (physician) and Advanced Practice Providers or APPs (Physician Assistants and Nurse Practitioners) who all work together to provide you with the care you need, when you need it.  Your next appointment:   1 year(s)  Provider:   Knox Perl, MD   We recommend signing up for the patient portal called "MyChart".  Sign up information is provided on this After Visit Summary.  MyChart is used to connect with patients for Virtual Visits (Telemedicine).  Patients are able to view lab/test results, encounter notes, upcoming appointments, etc.  Non-urgent messages can be sent to your provider as well.   To learn more about what you can do with MyChart, go to [email or web address].

## 2024-05-06 NOTE — Progress Notes (Signed)
 Cardiology Office Note:  .   Date:  05/06/2024  ID:  Shannon Bowman, DOB 11-Aug-1932, MRN [id number] PCP: Windell Hasty, DO  [facility] Providers Cardiologist:  Knox Perl, MD   History of Present Illness: .   Shannon Bowman is a 89 y.o. hyperlipidemia, severe LVH, aortic atherosclerosis, CKD stage III, former tobacco use and TIA in August 2023. She also has supine hypertension and orthostatic hypotension.  She has had a nuclear cardiac PET on 10/23/2023 revealing high risk study with ischemia in the LAD distribution with evidence of transient ischemic dilatation noted on stress images considered to be high risk.  Echocardiogram on 10/24/2023 revealed preserved LVEF and moderate aortic regurgitation and mild to moderate MR.  After discussions regarding proceeding with cardiac catheterization, patient preferred medical therapy she now presents for 43-month follow-up.  Discussed the use of AI scribe software for clinical note transcription with the patient, who gave verbal consent to proceed.  History of Present Illness Shannon Bowman is an 89 year old female with coronary artery disease who presents with symptoms of dizziness, nausea, and leg cramps.  She experiences persistent dizziness, nausea, weakness, and fatigue, which she attributes to her medications. These symptoms vary in intensity, with some days being worse than others. She frequently feels unsteady and tired. She does not experience chest pain during activities but does feel out of breath when walking.  She wakes up frequently at night due to leg pain and cramps, which are significant and severe. She finds relief by taking a spoon of mustard. Recent swelling in her feet and legs, particularly at the ankles, is noted, especially after prolonged sitting. The swelling subsides by morning.  She recalls an episode of feeling lightheaded and shaky during minimal activity, with elevated blood pressure at 164/unknown and a low  pulse. Nitroglycerin  and rest alleviated the symptoms. This episode was similar to past experiences but infrequent.  Labs   Lab Results  Component Value Date   CHOL 133 11/06/2023   HDL 57 11/06/2023   LDLCALC 61 11/06/2023   LDLDIRECT 62 11/06/2023   TRIG 77 11/06/2023   CHOLHDL 2.3 11/06/2023   Lab Results  Component Value Date   NA 139 09/26/2023   K 4.0 09/26/2023   CO2 23 09/26/2023   GLUCOSE 134 (H) 09/26/2023   BUN 14 09/26/2023   CREATININE 0.97 09/26/2023   CALCIUM  9.0 09/26/2023   EGFR 56 (L) 09/26/2023   GFRNONAA 37 (L) 07/21/2015      Latest Ref Rng & Units 09/26/2023   10:25 AM 07/20/2022    5:13 PM 07/21/2015    1:35 PM  BMP  Glucose 70 - 99 mg/dL 846   962   BUN 8 - 27 mg/dL 14   21   Creatinine 9.52 - 1.00 mg/dL 8.41  3.24  4.01   BUN/Creat Ratio 12 - 28 14     Sodium 134 - 144 mmol/L 139   140   Potassium 3.5 - 5.2 mmol/L 4.0   4.4   Chloride 96 - 106 mmol/L 103   105   CO2 20 - 29 mmol/L 23   29   Calcium  8.7 - 10.3 mg/dL 9.0   9.6       Latest Ref Rng & Units 09/26/2023   10:25 AM 07/21/2015    1:35 PM  CBC  WBC 3.4 - 10.8 x10E3/uL 7.7  5.6   Hemoglobin 11.1 - 15.9 g/dL 02.7  25.3   Hematocrit 34.0 -  46.6 % 46.4  38.3   Platelets 150 - 450 x10E3/uL 228  200     Lab Results  Component Value Date   TSH 1.720 09/26/2023    ROS  Review of Systems  Cardiovascular:  Positive for leg swelling (occasional).  Neurological:  Positive for dizziness.   Physical Exam:   VS:  BP (!) 142/66   Pulse 71   Ht 5\' 1"  (1.549 m)   Wt 127 lb 3.2 oz (57.7 kg)   SpO2 94%   BMI 24.03 kg/m    Wt Readings from Last 3 Encounters:  05/06/24 127 lb 3.2 oz (57.7 kg)  11/06/23 127 lb 12.8 oz (58 kg)  09/26/23 123 lb (55.8 kg)    Physical Exam Neck:     Vascular: No carotid bruit or JVD.  Cardiovascular:     Rate and Rhythm: Normal rate and regular rhythm.     Pulses: Intact distal pulses.     Heart sounds: Normal heart sounds. No murmur heard.    No  gallop.  Pulmonary:     Effort: Pulmonary effort is normal.     Breath sounds: Normal breath sounds.  Abdominal:     General: Bowel sounds are normal.     Palpations: Abdomen is soft.  Musculoskeletal:     Right lower leg: No edema.     Left lower leg: No edema.    Studies Reviewed: Aaron Aas     EKG:         Medications and allergies    Allergies  Allergen Reactions   Phenylephrine  Other (See Comments)    Possible allergy to dilating drop phenylephrine  2.5% - USE Tropicamide 1% only to dilate Possible allergy to dilating drop phenylephrine  2.5% - USE Tropicamide 1% only to dilate    Penicillins Rash     Current Outpatient Medications:    acetaminophen  (TYLENOL ) 325 MG tablet, Take 650 mg by mouth every 6 (six) hours as needed for mild pain., Disp: , Rfl:    amLODipine  (NORVASC ) 5 MG tablet, Take 1 tablet (5 mg total) by mouth daily., Disp: 90 tablet, Rfl: 2   ascorbic acid (VITAMIN C) 500 MG tablet, Take 500 mg by mouth daily., Disp: , Rfl:    aspirin EC 81 MG tablet, Take 81 mg by mouth daily. Swallow whole., Disp: , Rfl:    betamethasone  valerate (VALISONE ) 0.1 % cream, Apply 1 Application topically daily., Disp: , Rfl:    calcium  carbonate (OS-CAL) 600 MG TABS, Take 600 mg by mouth 2 (two) times daily with a meal., Disp: , Rfl:    cetirizine (ZYRTEC) 10 MG tablet, Take 10 mg by mouth daily as needed for allergies. , Disp: , Rfl:    Cholecalciferol (VITAMIN D3) 25 MCG (1000 UT) CAPS, Take by mouth., Disp: , Rfl:    ezetimibe (ZETIA) 10 MG tablet, Take 1 tablet (10 mg total) by mouth daily., Disp: , Rfl:    metoprolol  succinate (TOPROL  XL) 25 MG 24 hr tablet, Take 0.5 tablets (12.5 mg total) by mouth daily., Disp: 45 tablet, Rfl: 3   omeprazole (PRILOSEC) 40 MG capsule, Take 40 mg by mouth as needed., Disp: , Rfl:    Probiotic CAPS, Take 2 capsules by mouth daily., Disp: , Rfl:    Propylene Glycol (SYSTANE BALANCE OP), Apply 1 drop to eye at bedtime., Disp: , Rfl:     rosuvastatin  (CRESTOR ) 20 MG tablet, Take 1 tablet (20 mg total) by mouth daily., Disp: 90 tablet, Rfl: 2  traMADol (ULTRAM) 50 MG tablet, TAKE 1/2 TO 1 TABLET BY MOUTH EVERY 6 TO 8 HOURS AS NEEDED FOR PAIN, Disp: , Rfl:    nitroGLYCERIN  (NITROSTAT ) 0.4 MG SL tablet, Place 1 tablet (0.4 mg total) under the tongue every 5 (five) minutes as needed for chest pain., Disp: 25 tablet, Rfl: 2   No orders of the defined types were placed in this encounter.    There are no discontinued medications.   ASSESSMENT AND PLAN: .      ICD-10-CM   1. Coronary artery disease of native artery of native heart with stable angina pectoris (HCC)  I25.118     2. Near syncope  R55     3. Leg cramps  R25.2     4. Moderate aortic regurgitation  I35.1     5. Orthostatic hypotension  I95.1       Assessment and Plan Assessment & Plan Coronary artery disease with stable angina Coronary artery disease with stable angina. Previous stress test indicated LAD stenosis. She is asymptomatic with no chest pain or tightness during activities. Catheterization is avoided due to age and mild renal impairment. Current medications effectively manage symptoms. Occasional dizziness and nausea are likely medication side effects but are outweighed by treatment benefits. - Continue current medications. Her current medications include amlodipine  and metoprolol  for blood pressure management, and she takes a baby aspirin daily. - Monitor for increased frequency or severity of symptoms. - Report any new or worsening symptoms, especially chest pain or tightness. - Sit or lie down immediately if experiencing lightheadedness or dizziness.  Aortic valve reguirgitation Mild regurgitation. Heart sounds are normal, and no significant issues are detected. - Monitor mitral valve disorder.  Stage 3 chronic kidney disease Stage 3 chronic kidney disease, well-managed with no changes in kidney function. - Continue current  management.  Orthostatic hypotension Orthostatic hypotension with episodes of dizziness and lightheadedness. Blood pressure drops upon standing. Current management is appropriate to avoid exacerbating symptoms. - Check blood pressure sitting and standing occasionally. - Sit or lie down immediately if experiencing lightheadedness or dizziness. - Continue current blood pressure medications. - She has had occasional episodes of near syncope, extremely difficult to state whether this is related to orthostasis or she may have conduction system disease.  However there are foreign few hence we will continue observation for now.  I will see her back in a year or sooner if problems.   Signed,  Knox Perl, MD, [facility] 05/06/2024, 1:54 PM [facility] [address] [place], [place] [address] Phone: [phone]. Fax:  [phone]

## 2024-10-04 ENCOUNTER — Other Ambulatory Visit: Payer: Self-pay | Admitting: Cardiology
